# Patient Record
Sex: Female | Born: 1975 | Race: Black or African American | Hispanic: No | Marital: Single | Smoking: Never smoker
Health system: Southern US, Community
[De-identification: ages and names within clinical notes are randomized; demographics above are authoritative.]

## PROBLEM LIST (undated history)

## (undated) DIAGNOSIS — B009 Herpesviral infection, unspecified: Secondary | ICD-10-CM

## (undated) DIAGNOSIS — G43909 Migraine, unspecified, not intractable, without status migrainosus: Secondary | ICD-10-CM

## (undated) DIAGNOSIS — I10 Essential (primary) hypertension: Secondary | ICD-10-CM

## (undated) HISTORY — PX: TUBAL LIGATION: SHX77

---

## 2008-05-03 ENCOUNTER — Encounter: Payer: Self-pay | Admitting: Emergency Medicine

## 2008-05-03 ENCOUNTER — Inpatient Hospital Stay (HOSPITAL_COMMUNITY): Admission: EM | Admit: 2008-05-03 | Discharge: 2008-05-06 | Payer: Self-pay | Admitting: Internal Medicine

## 2008-05-04 ENCOUNTER — Encounter (INDEPENDENT_AMBULATORY_CARE_PROVIDER_SITE_OTHER): Payer: Self-pay | Admitting: Internal Medicine

## 2010-12-26 NOTE — Discharge Summary (Signed)
Robin Jarvis, Robin Jarvis                 ACCOUNT NO.:  [id number]   MEDICAL RECORD NO.:  [id number]          PATIENT TYPE:  INP   LOCATION:  1525                         FACILITY:  [facility]   PHYSICIAN:  Michelene Gardener, MD    DATE OF BIRTH:  1976-04-06   DATE OF ADMISSION:  05/03/2008  DATE OF DISCHARGE:  05/06/2008                               DISCHARGE SUMMARY   DISCHARGE DIAGNOSES:  1. Accelerated hypertension.  2. Noncardiac chest pain.  3. Hyperlipidemia.  4. Obesity.   DISCHARGE MEDICATIONS:  1. Multivitamin 1 tablet p.o. once daily.  2. Hydrochlorothiazide/lisinopril 25/40 mg 1 tablet p.o. once daily.   CONSULTATIONS:  None.   PROCEDURES:  None.   RADIOLOGY STUDIES:  1. Chest x-ray on May 03, 2008 showed possible cardiomegaly with      no acute abnormalities.  2. Echocardiogram on September 22 showed ejection fraction of 65-75%      with no wall motion abnormalities.   COURSE OF HOSPITALIZATION:  1. Accelerated hypertension.  This patient was diagnosed with      hypertension and used to take atenolol.  She stopped atenolol for      the last 1 year.  She presented to the hospital with intermittent      chest pain, and she was found to be in hypertensive emergency with      a blood pressure of 200/112.  The patient was admitted to the      intensive care unit.  She was started on a nitroglycerin drip.  The      patient was started back on atenolol and clonidine was added, in      addition to hydralazine p.r.n.  Three sets of troponin and cardiac      enzymes were done, and they came to be normal.  The patient      developed bradycardia during the hospitalization, so her atenolol      was stopped.  I also stopped her clonidine, and I switched her to a      combination of hydrochlorothiazide and lisinopril where she was      started with 25/20 mg.  Her blood pressure improved well.      Currently, she is in the range of 150-160 systolic blood pressure.      At the time  of discharge, I increased her medications, where she      will take hydrochlorothiazide/lisinopril 25/40 mg once a day.  I      had very long discussion with her that she needs to establish      medical care, and she has to see a doctor within 1 week.  The      patient voiced understanding and stated that she will see her      doctor within 1 week.  2. Noncardiac chest pain.  This is most likely pressure from      hypertension.  The patient did not have any chest pain since her      admission.  Three sets of troponins and cardiac enzymes were done      during  the hospitalization, and they came to be normal.  EKGs      showed no evidence of acute ischemia.  Echocardiogram was done and      showed a normal ejection fraction with no evidence of wall motion      abnormalities.  3. Hyperlipidemia.  The patient was advised about lifestyle      modification.  4. Obesity.  Again, was advised about lifestyle modifications.   DISPOSITION:  Otherwise, the patient seems to be stable, and she was  discharged in satisfactory condition.   TOTAL ASSESSMENT TIME:  Forty minutes.      Michelene Gardener, MD  Electronically Signed     NAE/MEDQ  D:  05/06/2008  T:  05/06/2008  Job:  [id number]

## 2010-12-26 NOTE — H&P (Signed)
NAME:  Robin Jarvis, Robin Jarvis NO.:  [id number]   MEDICAL RECORD NO.:  [id number]          PATIENT TYPE:  INP   LOCATION:  1236                         FACILITY:  [facility]   PHYSICIAN:  Richarda Overlie, MD       DATE OF BIRTH:  Feb 07, 1976   DATE OF ADMISSION:  05/03/2008  DATE OF DISCHARGE:                              HISTORY & PHYSICAL   SUBJECTIVE:  This is a 35 year old female with a history of hypertension  who presented to the ER at [facility] with a chief complaint of  chest pain.  The patient has had intermittent chest pain off and on  since last Thursday.  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  The patient's  chest pain improved with rest.  Her symptoms were shortness of breath,  nausea  and some diaphoresis.  Patient has been diagnosed with  hypertension and was supposed to be taking atenolol but has not been  doing so for the past two years.  The patient's chest pain was described  as intermittent with increasing intensity in duration, most recently  lasting for more than four hours.  Patient's chest pain is notably  present every time she is exerting herself and relieved with rest.  The  radiation of the pain to the left arm is aggravated by deep breathing  and exercise.   In the ER in [facility], the patient was found to have a systolic blood  pressure greater than 200, and she received treatment with metoprolol  and was started on a nitro drip.  Subsequent blood pressures remained  close to systolic at 180-200.  Patient was transferred to [facility]  for further evaluation.   PAST MEDICAL HISTORY:  Hypertension.    MEDICATIONS: none   PAST SURGICAL HISTORY: Bilateral tubal ligation.   SOCIAL HISTORY:  Denies any drug use, nondrinker, nonalcoholic.   FAMILY HISTORY:  Negative for coronary artery disease.   REVIEW OF SYSTEMS:  Ten point review of systems was performed and is  included in the HPI.   PHYSICAL EXAMINATION:  Blood pressure 200/112, pulse 77, respirations  18, temperature 98.9.  GENERAL:  Patient appears to be comfortable currently.  Chest painfree.  No acute distress.  HEENT:  Pupils are equal and reactive.  Extraocular movements are  intact.  Head is atraumatic and normocephalic.  Mouth and pharynx are  normal.  NECK:  Supple with full range of motion.  CARDIOVASCULAR:  Regular rate and rhythm.  No appreciable murmurs, rubs  or gallops.  RESPIRATORY:  Breath sounds are clear to auscultation bilaterally.  No  wheezes, crackles, or rhonchi.  No accessory muscle use.  ABDOMEN:  Soft, nontender, nondistended.  No hepatosplenomegaly.  Normal  bowel sounds.  No guarding or rebound.  EXTREMITIES:  Without any deformity, abnormality.  Full range of motion.  Neurovascularly intact.  Pulses normal.  No tenderness.  No edema.  NEUROLOGIC:  Alert and oriented to time,  place, and person.  Cranial  nerves II-XII are grossly intact.  Motor intact in all extremities.  Sensation normal.  Gait normal.  SKIN:  Color normal.  No rash.  PSYCHIATRIC:  Appropriate mood and affect.  LYMPH NODES:  No palpable lymphadenopathy.   EKG:  Normal sinus rhythm without any ST segment changes.   LABS:  WBC count 12.2, hemoglobin 13.1, hematocrit 38, platelet count  269.  Sodium 142, potassium 3.4, chloride 104, bicarb 26, glucose 91,  creatinine 0.9.  Troponin less than 0.05.  CK-MB 1.4.  Urine drug screen  positive for opiates.   Chest x-ray shows probable cardiomegaly.  No focal abnormalities.   ASSESSMENT/PLAN:  1. Hypertensive urgency associated with chest pain:  Without any EKG      changes  .  The patient will be ruled out for an acute coronary      syndrome.  Will cycle troponins and serial EKGs, monitor the      patient on telemetry.  The patient will be started on aspirin,      continued  with a nitro drip.  Put the patient on atenolol 50 mg      p.o. daily, clonidine 0.1 mg p.o. t.i.d.  Also start the patient on      hydralazine 10 mg IV q.6h. p.r.n. for systolic blood pressure      greater than 180.  Patient's symptoms also suggestive of unstable      angina; therefore, will start the patient on therapeutic dose of      Lovenox adjusted to her renal function.  Chest pain could also be      secondary to musculoskeletal related to the nature of her      occupation.  Will start the patient on Flexeril 10 mg p.o. t.i.d.      p.r.n. pain.   DISPOSITION:  Patient is a full code.  Will be kept n.p.o. past  midnight.      Richarda Overlie, MD  Electronically Signed     NA/MEDQ  D:  05/03/2008  T:  05/04/2008  Job:  [id number]

## 2011-05-14 LAB — DIFFERENTIAL
Basophils Absolute: 0
Basophils Absolute: 0
Basophils Relative: 0
Eosinophils Absolute: 0.1
Eosinophils Absolute: 0.1
Eosinophils Relative: 1
Eosinophils Relative: 1
Lymphocytes Relative: 26
Lymphocytes Relative: 28
Lymphs Abs: 2.9
Lymphs Abs: 3.2
Lymphs Abs: 3.4
Monocytes Absolute: 0.8
Monocytes Absolute: 1.1 — ABNORMAL HIGH
Monocytes Absolute: 1.2 — ABNORMAL HIGH
Monocytes Relative: 7
Monocytes Relative: 9
Neutro Abs: 7.6
Neutro Abs: 7.7
Neutrophils Relative %: 62

## 2011-05-14 LAB — BASIC METABOLIC PANEL WITH GFR
Calcium: 9.4
GFR calc Af Amer: >60
GFR calc non Af Amer: >60
Potassium: 3.4 — ABNORMAL LOW
Sodium: 142

## 2011-05-14 LAB — COMPREHENSIVE METABOLIC PANEL
Albumin: 3 — ABNORMAL LOW
Alkaline Phosphatase: 57
BUN: 5 — ABNORMAL LOW
Chloride: 107
Creatinine, Ser: 0.74
GFR calc non Af Amer: >60
Glucose, Bld: 93
Potassium: 4
Total Bilirubin: 0.9

## 2011-05-14 LAB — CBC
HCT: 35.4 — ABNORMAL LOW
HCT: 35.7 — ABNORMAL LOW
HCT: 38
Hemoglobin: 11.8 — ABNORMAL LOW
Hemoglobin: 12
Hemoglobin: 12.4
Hemoglobin: 13.1
MCHC: 33.5
MCHC: 34.3
MCV: 90.1
MCV: 92.5
MCV: 93.5
Platelets: 223
Platelets: 261
Platelets: 269
RBC: 4.01
RBC: 4.22
RDW: 12
RDW: 12.5
WBC: 10.1
WBC: 11.5 — ABNORMAL HIGH
WBC: 12.2 — ABNORMAL HIGH

## 2011-05-14 LAB — LIPID PANEL
Cholesterol: 196
HDL: 36 — ABNORMAL LOW
LDL Cholesterol: 137 — ABNORMAL HIGH
Total CHOL/HDL Ratio: 5.4
Triglycerides: 114
VLDL: 23

## 2011-05-14 LAB — BASIC METABOLIC PANEL
BUN: 11
BUN: 14
BUN: 8
CO2: 26
Chloride: 104
Chloride: 106
Creatinine, Ser: 0.9
GFR calc non Af Amer: >60
Glucose, Bld: 120 — ABNORMAL HIGH
Glucose, Bld: 91
Glucose, Bld: 99
Potassium: 3.3 — ABNORMAL LOW
Potassium: 3.6
Sodium: 136

## 2011-05-14 LAB — POCT TOXICOLOGY PANEL

## 2011-05-14 LAB — D-DIMER, QUANTITATIVE: D-Dimer, Quant: <0.22

## 2011-05-14 LAB — CARDIAC PANEL(CRET KIN+CKTOT+MB+TROPI)
CK, MB: 1.6
Troponin I: 0.02

## 2011-05-14 LAB — POCT CARDIAC MARKERS
CKMB, poc: 1.6
Myoglobin, poc: 38.4
Troponin i, poc: <0.05

## 2011-11-20 ENCOUNTER — Emergency Department (HOSPITAL_BASED_OUTPATIENT_CLINIC_OR_DEPARTMENT_OTHER)
Admission: EM | Admit: 2011-11-20 | Discharge: 2011-11-20 | Disposition: A | Discharge status: Home / Self Care | Payer: Medicaid Other | Attending: Emergency Medicine | Admitting: Emergency Medicine

## 2011-11-20 ENCOUNTER — Encounter (HOSPITAL_BASED_OUTPATIENT_CLINIC_OR_DEPARTMENT_OTHER): Payer: Self-pay | Admitting: Emergency Medicine

## 2011-11-20 DIAGNOSIS — IMO0002 Reserved for concepts with insufficient information to code with codable children: Secondary | ICD-10-CM | POA: Insufficient documentation

## 2011-11-20 DIAGNOSIS — R202 Paresthesia of skin: Secondary | ICD-10-CM

## 2011-11-20 DIAGNOSIS — M5417 Radiculopathy, lumbosacral region: Secondary | ICD-10-CM

## 2011-11-20 DIAGNOSIS — M549 Dorsalgia, unspecified: Secondary | ICD-10-CM

## 2011-11-20 DIAGNOSIS — I10 Essential (primary) hypertension: Secondary | ICD-10-CM | POA: Insufficient documentation

## 2011-11-20 HISTORY — DX: Essential (primary) hypertension: I10

## 2011-11-20 LAB — URINALYSIS, ROUTINE W REFLEX MICROSCOPIC
Glucose, UA: NEGATIVE mg/dL
Ketones, ur: NEGATIVE mg/dL
Leukocytes, UA: NEGATIVE
Nitrite: NEGATIVE
Protein, ur: NEGATIVE mg/dL
Urobilinogen, UA: 1 mg/dL (ref 0.0–1.0)

## 2011-11-20 LAB — PREGNANCY, URINE: Preg Test, Ur: NEGATIVE

## 2011-11-20 MED ORDER — OXYCODONE-ACETAMINOPHEN 5-325 MG PO TABS: 1.0000 | ORAL_TABLET | Freq: Four times a day (QID) | ORAL | Status: AC | PRN

## 2011-11-20 MED ORDER — IBUPROFEN 600 MG PO TABS: 600.0000 mg | ORAL_TABLET | Freq: Four times a day (QID) | ORAL | Status: AC | PRN

## 2011-11-20 MED ORDER — PREDNISONE 20 MG PO TABS: 20.0000 mg | ORAL_TABLET | Freq: Every day | ORAL | Status: DC

## 2011-11-20 NOTE — ED Notes (Signed)
Pt states she has been having left lower abdominal pain/groin pain x one week.  Some nausea, no fevers.  Pt states period is off.

## 2011-11-20 NOTE — Discharge Instructions (Signed)
Lumbosacral Radiculopathy  Lumbosacral radiculopathy is a pinched nerve or nerves in the low back (lumbosacral area). When this happens you may have weakness in your legs and may not be able to stand on your toes. You may have pain going down into your legs. There may be difficulties with walking normally. There are many causes of this problem. Sometimes this may happen from an injury, or simply from arthritis or boney problems. It may also be caused by other illnesses such as diabetes. If there is no improvement after treatment, further studies may be done to find the exact cause. DIAGNOSIS  X-rays may be needed if the problems become long standing. Electromyograms may be done. This study is one in which the working of nerves and muscles is studied. HOME CARE INSTRUCTIONS   Applications of ice packs may be helpful. Ice can be used in a plastic bag with a towel around it to prevent frostbite to skin. This may be used every 2 hours for 20 to 30 minutes, or as needed, while awake, or as directed by your caregiver.   Only take over-the-counter or prescription medicines for pain, discomfort as directed by your caregiver.   If physical therapy was prescribed, follow your caregiver's directions.  SEEK IMMEDIATE MEDICAL CARE IF:   You have pain not controlled with medications.   You seem to be getting worse rather than better.   You develop increasing weakness in your legs.   You develop loss of bowel or bladder control.   You have difficulty with walking or balance, or develop clumsiness in the use of your legs.   You have a fever.  MAKE SURE YOU:   Understand these instructions.   Will watch your condition.   Will get help right away if you are not doing well or get worse.  Document Released: 07/30/2005 Document Revised: 07/19/2011 Document Reviewed: 03/19/2008 [facility] Patient Information 2012 [place], [place].  RESOURCE GUIDE  Dental Problems  Patients with Medicaid: [facility] [phone] [address]                                           [phone] [address] Phone:  [phone]                                                   Phone:  [phone]  If unable to pay or uninsured, contact:  [facility] or [facility]. to become qualified for the adult dental clinic.  Chronic Pain Problems Contact [facility]  [phone] Patients need to be referred by their primary care doctor.  Insufficient Money for Medicine Contact [employer] Way:  call "211" or [facility] [phone].  No Primary Care Doctor Call [facility]  [phone] Other agencies that provide inexpensive medical care    [facility]  [phone]    [facility]  [phone]    [facility]  [phone]    [facility]  [phone]    [facility]  [phone]    [facility]  [phone]  Psychological Services  [facility]  [phone] [facility]  [phone] [facility]   [phone] (emergency services [phone])  Abuse/Neglect [facility] Child Abuse Hotline 606 255 1513 [facility] Child Abuse Hotline [phone] (After Hours)  Emergency Shelter [facility] [phone]  [facility] 5871084321 [facility] [phone]  MRSA Hotline #:   980-863-2776    [facility] Resources  [facility]  United [employer]                           [facility] Dept. [address] [place]                     [address]         [address]  Blondell Reveal Phone:  [phone]                                  Phone:  [phone]                   Phone:  [phone]  [facility] Phone:  [phone]  [facility]  Child Abuse Hotline [phone] [phone] (After Hours)

## 2011-11-20 NOTE — ED Provider Notes (Signed)
History     CSN: [id number]  Arrival date & time 11/20/11  0803   First MD Initiated Contact with Patient 11/20/11 [phone]      Chief Complaint  Patient presents with  . Back Pain  . Numbness  . Nausea    (Consider location/radiation/quality/duration/timing/severity/associated sxs/prior treatment) HPI   Multiple complaints x one week. CO Lt flank/back pain x one week. Constant and pulsating with some radiation to leg. 5/10 at this time and dull/nagging. Has taken midol, ibuprofen with min relief of pain. Denies fever +chills. +Nausea without vomiting. Denies constipation/diarrhea. Denies abdominal pain. No rash. Denies hematuria/dysuria/freq/urgency. Denies vaginal discharge.  C/O leg numbness and tingling with sitting/flexed position only. Denies leg weakness. Remains ambulatory. Denies history of recent trauma/falls. Denies h/o malignancy, DM, immunocompromise  injection drug use, immunosuppression, indwelling urinary catheter, prolonged steroid use, skin or urinary tract infection.  Denies saddle anesthesia, no urinary incontinence or retention.   States her menstrual cycle is 4 days late  H/o BTL 8 years ago   ED Notes, ED Provider Notes from 11/20/11 0000 to 11/20/11 08:19:04       Marva Brett Fairy, RN 11/20/2011 08:16      Pt states she has been having left lower abdominal pain/groin pain x one week. Some nausea, no fevers. Pt states period is off.    Past Medical History  Diagnosis Date  . Hypertension     Past Surgical History  Procedure Date  . Cesarean section   . Tubal ligation     No family history on file.  History  Substance Use Topics  . Smoking status: Not on file  . Smokeless tobacco: Not on file  . Alcohol Use:     OB History    Grav Para Term Preterm Abortions TAB SAB Ect Mult Living                  Review of Systems  All other systems reviewed and are negative.  except as noted HPI   Allergies  Review of patient's allergies indicates no  known allergies.  Home Medications   Current Outpatient Rx  Name Route Sig Dispense Refill  . ATENOLOL 50 MG PO TABS Oral Take 50 mg by mouth 2 (two) times daily.    Marland Kitchen LISINOPRIL 10 MG PO TABS Oral Take 10 mg by mouth daily.    . IBUPROFEN 600 MG PO TABS Oral Take 1 tablet (600 mg total) by mouth every 6 (six) hours as needed for pain. 30 tablet 0  . OXYCODONE-ACETAMINOPHEN 5-325 MG PO TABS Oral Take 1 tablet by mouth every 6 (six) hours as needed for pain. 6 tablet 0  . PREDNISONE 20 MG PO TABS Oral Take 1 tablet (20 mg total) by mouth daily. 7 tablet 0    BP 148/91  Pulse 60  Temp(Src) 99.2 F (37.3 C) (Oral)  Resp 20  SpO2 100%  LMP 10/17/2011  Physical Exam  Nursing note and vitals reviewed. Constitutional: She is oriented to person, place, and time. She appears well-developed.  HENT:  Head: Atraumatic.  Mouth/Throat: Oropharynx is clear and moist.  Eyes: Conjunctivae and EOM are normal. Pupils are equal, round, and reactive to light.  Neck: Normal range of motion. Neck supple.  Cardiovascular: Normal rate, regular rhythm, normal heart sounds and intact distal pulses.   Pulmonary/Chest: Effort normal and breath sounds normal. No respiratory distress. She has no wheezes. She has no rales.  Abdominal: Soft. She exhibits no distension. There is no  tenderness. There is no rebound and no guarding.       No ttp  Musculoskeletal: Normal range of motion.       Back:       Straight leg raise neg b/l, no increased pain but LLE flexion at the hip pt c/o foot numbness/tingling  No midline c/t/l/s ttp   Neurological: She is alert and oriented to person, place, and time.  Skin: Skin is warm and dry. No rash noted.  Psychiatric: She has a normal mood and affect.    ED Course  Procedures (including critical care time)   Labs Reviewed  URINALYSIS, ROUTINE W REFLEX MICROSCOPIC  PREGNANCY, URINE   No results found.  1. Back pain   2. Radiculopathy of lumbosacral region   3.  Paresthesia of left leg     MDM  Pt c/o L/S pain with paresthesia of left leg with sitting. DDx incl nerve compression including stenosis, disc herniation, less likely hematoma/infection/tumor given history. Neurovascularly intact. Urinalysis unremarkable. I do not suspect acute abdominal/pelvic etiology. PMD f/u with prednisone, percocet, ibuprofen. Will need advanced imaging if symptoms persist. Given strict precautions for return.         Forbes Cellar, MD 11/20/11 [phone]

## 2015-01-26 ENCOUNTER — Emergency Department (HOSPITAL_BASED_OUTPATIENT_CLINIC_OR_DEPARTMENT_OTHER)
Admission: EM | Admit: 2015-01-26 | Discharge: 2015-01-26 | Disposition: A | Discharge status: Home / Self Care | Payer: Self-pay | Attending: Emergency Medicine | Admitting: Emergency Medicine

## 2015-01-26 ENCOUNTER — Encounter (HOSPITAL_BASED_OUTPATIENT_CLINIC_OR_DEPARTMENT_OTHER): Payer: Self-pay

## 2015-01-26 DIAGNOSIS — Y929 Unspecified place or not applicable: Secondary | ICD-10-CM | POA: Insufficient documentation

## 2015-01-26 DIAGNOSIS — Y939 Activity, unspecified: Secondary | ICD-10-CM | POA: Insufficient documentation

## 2015-01-26 DIAGNOSIS — Y999 Unspecified external cause status: Secondary | ICD-10-CM | POA: Insufficient documentation

## 2015-01-26 DIAGNOSIS — I10 Essential (primary) hypertension: Secondary | ICD-10-CM | POA: Insufficient documentation

## 2015-01-26 DIAGNOSIS — G43909 Migraine, unspecified, not intractable, without status migrainosus: Secondary | ICD-10-CM | POA: Insufficient documentation

## 2015-01-26 DIAGNOSIS — M62830 Muscle spasm of back: Secondary | ICD-10-CM | POA: Insufficient documentation

## 2015-01-26 DIAGNOSIS — M62838 Other muscle spasm: Secondary | ICD-10-CM

## 2015-01-26 DIAGNOSIS — Z79899 Other long term (current) drug therapy: Secondary | ICD-10-CM | POA: Insufficient documentation

## 2015-01-26 DIAGNOSIS — X58XXXA Exposure to other specified factors, initial encounter: Secondary | ICD-10-CM | POA: Insufficient documentation

## 2015-01-26 DIAGNOSIS — S39012A Strain of muscle, fascia and tendon of lower back, initial encounter: Secondary | ICD-10-CM | POA: Insufficient documentation

## 2015-01-26 HISTORY — DX: Migraine, unspecified, not intractable, without status migrainosus: G43.909

## 2015-01-26 MED ORDER — MELOXICAM 15 MG PO TABS: 15.0000 mg | ORAL_TABLET | Freq: Every day | ORAL | Status: DC

## 2015-01-26 MED ORDER — METHOCARBAMOL 500 MG PO TABS: 500.0000 mg | ORAL_TABLET | Freq: Two times a day (BID) | ORAL | Status: DC

## 2015-01-26 MED ORDER — KETOROLAC TROMETHAMINE 60 MG/2ML IM SOLN
60.0000 mg | Freq: Once | INTRAMUSCULAR | Status: AC
  Administered 2015-01-26: 60 mg via INTRAMUSCULAR
  Filled 2015-01-26: qty 2

## 2015-01-26 MED ORDER — METHOCARBAMOL 500 MG PO TABS
1000.0000 mg | ORAL_TABLET | Freq: Once | ORAL | Status: AC
  Administered 2015-01-26: 1000 mg via ORAL
  Filled 2015-01-26: qty 2

## 2015-01-26 NOTE — Discharge Instructions (Signed)
Heat Therapy °Heat therapy can help make painful, stiff muscles and joints feel better. Do not use heat on new injuries. Wait at least 48 hours after an injury to use heat. Do not use heat when you have aches or pains right after an activity. If you still have pain 3 hours after stopping the activity, then you may use heat. °HOME CARE °Wet heat pack °· Soak a clean towel in warm water. Squeeze out the extra water. °· Put the warm, wet towel in a plastic bag. °· Place a thin, dry towel between your skin and the bag. °· Put the heat pack on the area for 5 minutes, and check your skin. Your skin may be pink, but it should not be red. °· Leave the heat pack on the area for 15 to 30 minutes. °· Repeat this every 2 to 4 hours while awake. Do not use heat while you are sleeping. °Warm water bath °· Fill a tub with warm water. °· Place the affected body part in the tub. °· Soak the area for 20 to 40 minutes. °· Repeat as needed. °Hot water bottle °· Fill the water bottle half full with hot water. °· Press out the extra air. Close the cap tightly. °· Place a dry towel between your skin and the bottle. °· Put the bottle on the area for 5 minutes, and check your skin. Your skin may be pink, but it should not be red. °· Leave the bottle on the area for 15 to 30 minutes. °· Repeat this every 2 to 4 hours while awake. °Electric heating pad °· Place a dry towel between your skin and the heating pad. °· Set the heating pad on low heat. °· Put the heating pad on the area for 10 minutes, and check your skin. Your skin may be pink, but it should not be red. °· Leave the heating pad on the area for 20 to 40 minutes. °· Repeat this every 2 to 4 hours while awake. °· Do not lie on the heating pad. °· Do not fall asleep while using the heating pad. °· Do not use the heating pad near water. °GET HELP RIGHT AWAY IF: °· You get blisters or red skin. °· Your skin is puffy (swollen), or you lose feeling (numbness) in the affected area. °· You  have any new problems. °· Your problems are getting worse. °· You have any questions or concerns. °If you have any problems, stop using heat therapy until you see your doctor. °MAKE SURE YOU: °· Understand these instructions. °· Will watch your condition. °· Will get help right away if you are not doing well or get worse. °Document Released: 10/22/2011 Document Reviewed: 09/22/2013 °Exit[employer]® Patient Information ©2015 [employer]. This information is not intended to replace advice given to you by your health care provider. Make sure you discuss any questions you have with your health care provider. ° °

## 2015-01-26 NOTE — ED Provider Notes (Signed)
CSN: [id number]     Arrival date & time 01/26/15  2222 History  This chart was scribed for Benny Henrie, MD by Ronney Lion, ED Scribe. This patient was seen in room MH10/MH10 and the patient's care was started at 11:16 PM.    Chief Complaint  Patient presents with  . Back Pain   Patient is a 39 y.o. female presenting with back pain. The history is provided by the patient. No language interpreter was used.  Back Pain Location:  Sacro-iliac joint Quality:  Aching Radiates to:  Does not radiate Pain severity:  Moderate Pain is:  Same all the time Onset quality:  Gradual Duration:  1 week Timing:  Constant Progression:  Worsening Chronicity:  New Context comment:  Repetitive occupational twisting motions  Relieved by:  Nothing Worsened by:  Nothing tried Ineffective treatments:  Ibuprofen Associated symptoms: no abdominal pain, no abdominal swelling, no bladder incontinence, no bowel incontinence, no chest pain, no dysuria, no fever, no headaches, no leg pain, no numbness, no paresthesias, no pelvic pain, no perianal numbness, no tingling, no weakness and no weight loss   Risk factors: no hx of cancer     HPI Comments: Robin Jarvis is a 39 y.o. female who presents to the Emergency Department complaining of constant, moderate mid and low back pain that began 1 week ago. She reports repetitive twisting motions at her first job and having to constantly move at her second job. Patient denies wearing any back bands at either her jobs.She has tried ibuprofen with no relief. Patient reports adequate water intake.   Past Medical History  Diagnosis Date  . Hypertension   . Migraine    Past Surgical History  Procedure Laterality Date  . Cesarean section    . Tubal ligation     No family history on file. History  Substance Use Topics  . Smoking status: Never Smoker   . Smokeless tobacco: Not on file  . Alcohol Use: Yes   OB History    No data available     Review of Systems   Constitutional: Negative for fever and weight loss.  Cardiovascular: Negative for chest pain.  Gastrointestinal: Negative for abdominal pain and bowel incontinence.  Genitourinary: Negative for bladder incontinence, dysuria and pelvic pain.  Musculoskeletal: Positive for back pain.  Neurological: Negative for tingling, weakness, numbness, headaches and paresthesias.  All other systems reviewed and are negative.   Allergies  Lisinopril  Home Medications   Prior to Admission medications   Medication Sig Start Date End Date Taking? Authorizing Provider  hydrochlorothiazide (HYDRODIURIL) 25 MG tablet Take 25 mg by mouth daily.   Yes Historical Provider, MD  topiramate (TOPAMAX) 50 MG tablet Take 50 mg by mouth 2 (two) times daily.   Yes Historical Provider, MD   BP 203/123 mmHg  Pulse 85  Temp(Src) 98.8 F (37.1 C) (Oral)  Resp 18  Ht  (1.753 m)  Wt 239 lb (108.41 kg)  BMI 35.28 kg/m2  SpO2 100%  LMP 01/22/2015 Physical Exam  Constitutional: She is oriented to person, place, and time. She appears well-developed and well-nourished. No distress.  HENT:  Head: Normocephalic and atraumatic.  Mouth/Throat: Oropharynx is clear and moist. No oropharyngeal exudate.  Moist mm.  Eyes: EOM are normal. Pupils are equal, round, and reactive to light.  Neck: Normal range of motion. Neck supple. No tracheal deviation present.  Cardiovascular: Normal rate, regular rhythm and normal heart sounds.   Pulmonary/Chest: Effort normal and breath sounds normal.  No respiratory distress. She has no wheezes. She has no rales.  Abdominal: Soft. Bowel sounds are normal. There is no tenderness. There is no rebound and no guarding.  Musculoskeletal: Normal range of motion.       Right shoulder: She exhibits no crepitus, no deformity, no spasm and normal pulse.  No step offs or crepitance or point tenderness of the  c t or L spine  Neurological: She is alert and oriented to person, place, and time. She  has normal reflexes. No cranial nerve deficit.  DTR's are intact.  Skin: Skin is warm and dry.  Psychiatric: She has a normal mood and affect.  Nursing note and vitals reviewed.   ED Course  Procedures (including critical care time)  DIAGNOSTIC STUDIES: Oxygen Saturation is 100% on RA, normal by my interpretation.    COORDINATION OF CARE: 11:19 PM - Discussed treatment plan with pt at bedside which includes muscle relaxants, and pt agreed to plan.    MDM   Final diagnoses:  None   Will treat for muscle strain with Mobic and robaxin.  Return for weakness numbness difficulty urinating or any concerns.    I personally performed the services described in this documentation, which was scribed in my presence. The recorded information has been reviewed and is accurate.     Cy Blamer, MD 01/27/15 [phone]

## 2015-01-26 NOTE — ED Notes (Signed)
C/o mid/lower back pain x 1 week-denies injury-steady gait in to triage

## 2015-01-27 ENCOUNTER — Encounter (HOSPITAL_BASED_OUTPATIENT_CLINIC_OR_DEPARTMENT_OTHER): Payer: Self-pay | Admitting: Emergency Medicine

## 2015-06-11 ENCOUNTER — Encounter (HOSPITAL_BASED_OUTPATIENT_CLINIC_OR_DEPARTMENT_OTHER): Payer: Self-pay | Admitting: *Deleted

## 2015-06-11 ENCOUNTER — Emergency Department (HOSPITAL_BASED_OUTPATIENT_CLINIC_OR_DEPARTMENT_OTHER)
Admission: EM | Admit: 2015-06-11 | Discharge: 2015-06-11 | Disposition: A | Discharge status: Home / Self Care | Payer: BLUE CROSS/BLUE SHIELD | Attending: Emergency Medicine | Admitting: Emergency Medicine

## 2015-06-11 DIAGNOSIS — M545 Low back pain, unspecified: Secondary | ICD-10-CM

## 2015-06-11 DIAGNOSIS — I10 Essential (primary) hypertension: Secondary | ICD-10-CM | POA: Diagnosis not present

## 2015-06-11 DIAGNOSIS — G8929 Other chronic pain: Secondary | ICD-10-CM | POA: Insufficient documentation

## 2015-06-11 DIAGNOSIS — Z79899 Other long term (current) drug therapy: Secondary | ICD-10-CM | POA: Diagnosis not present

## 2015-06-11 DIAGNOSIS — G43909 Migraine, unspecified, not intractable, without status migrainosus: Secondary | ICD-10-CM | POA: Diagnosis not present

## 2015-06-11 MED ORDER — METHOCARBAMOL 500 MG PO TABS
500.0000 mg | ORAL_TABLET | Freq: Two times a day (BID) | ORAL | Status: DC
Start: 2015-06-11 — End: 2019-11-10

## 2015-06-11 MED ORDER — MELOXICAM 15 MG PO TABS: 15.0000 mg | ORAL_TABLET | Freq: Every day | ORAL | Status: DC

## 2015-06-11 NOTE — ED Notes (Signed)
Pt reports frequent episodes of low back pain. Current episode began around Wednesday. Pt reports taking Motrin (800 mg), which is ineffective. Denies fever, n/v/d, radiation to legs, incontinence.

## 2015-06-11 NOTE — ED Provider Notes (Signed)
CSN: [id number]     Arrival date & time 06/11/15  2218 History   First MD Initiated Contact with Patient 06/11/15 2255     Chief Complaint  Patient presents with  . Back Pain     (Consider location/radiation/quality/duration/timing/severity/associated sxs/prior Treatment) HPI   Robin Jarvis is a 39 y.o. female presenting with lower back pain that started a few months ago, but got worse yesterday. Pt rates the pain at 8/10, described as a "pulling" of the muscle, non-radiating. Pt has tried ibuprofen with minimal relief. Pt states the mobic she got last time worked well. Pt denies fever/chills, N/V, abdominal pain, urinary symptoms, vaginal pain, discharge, or bleeding, or any other pain or complaints. Denies incontinence or saddle anesthesias.     Past Medical History  Diagnosis Date  . Hypertension   . Migraine    Past Surgical History  Procedure Laterality Date  . Cesarean section    . Tubal ligation     No family history on file. Social History  Substance Use Topics  . Smoking status: Never Smoker   . Smokeless tobacco: Never Used  . Alcohol Use: 2.4 oz/week    4 Glasses of wine per week   OB History    No data available     Review of Systems  Musculoskeletal: Positive for back pain.  All other systems reviewed and are negative.     Allergies  Lisinopril  Home Medications   Prior to Admission medications   Medication Sig Start Date End Date Taking? Authorizing Provider  hydrochlorothiazide (HYDRODIURIL) 25 MG tablet Take 25 mg by mouth daily.   Yes Historical Provider, MD  topiramate (TOPAMAX) 50 MG tablet Take 50 mg by mouth 2 (two) times daily.   Yes Historical Provider, MD  meloxicam (MOBIC) 15 MG tablet Take 1 tablet (15 mg total) by mouth daily. 06/11/15   Shawn C Joy, PA-C  methocarbamol (ROBAXIN) 500 MG tablet Take 1 tablet (500 mg total) by mouth 2 (two) times daily. 06/11/15   Shawn C Joy, PA-C   BP 214/113 mmHg  Pulse 69  Temp(Src) 98.5 F (36.9  C) (Oral)  Resp 18  Ht 5\' 9"  (1.753 m)  Wt 240 lb (108.863 kg)  BMI 35.43 kg/m2  SpO2 100%  LMP 05/30/2015 (Exact Date) Physical Exam  Constitutional: She appears well-developed.  Eyes: Conjunctivae are normal. Pupils are equal, round, and reactive to light.  Neck: Normal range of motion.  Cardiovascular: Normal rate and regular rhythm.   Pulmonary/Chest: Effort normal.  Musculoskeletal: Normal range of motion.  Full ROM, no tenderness, and no step off in L-spine or T-spine.  Neurological: She is alert.  No sensory deficits. Strength 5/5 all extremities. No gait disturbance.     ED Course  Procedures (including critical care time) Labs Review Labs Reviewed - No data to display  Imaging Review No results found. I have personally reviewed and evaluated these images and lab results as part of my medical decision-making.   EKG Interpretation None      MDM   Final diagnoses:  Right-sided low back pain without sciatica    Honestee Coppler presents with acute on chronic lower back pain.  Suspect musculoskeletal source of pain. Muscle relaxers and anti-inflammatory medication indicated. No indications for imaging at this time. Plan of care communicated to pt, who agreed to plan.     Anselm Pancoast, PA-C 06/11/15 2320  Margarita Grizzle, MD 06/12/15 [phone]

## 2015-06-11 NOTE — ED Notes (Signed)
Pt reports she's currently out of her BP med (for 3 weeks) and needs a refill.

## 2015-06-11 NOTE — Discharge Instructions (Signed)
You have been seen today for lower back pain. It is recommended that you select and follow up with a PCP on this matter for chronic care. Return to ED should symptoms worsen.   Back Exercises The following exercises strengthen the muscles that help to support the back. They also help to keep the lower back flexible. Doing these exercises can help to prevent back pain or lessen existing pain. If you have back pain or discomfort, try doing these exercises 2-3 times each day or as told by your health care provider. When the pain goes away, do them once each day, but increase the number of times that you repeat the steps for each exercise (do more repetitions). If you do not have back pain or discomfort, do these exercises once each day or as told by your health care provider. EXERCISES Single Knee to Chest Repeat these steps 3-5 times for each leg:  Lie on your back on a firm bed or the floor with your legs extended.  Bring one knee to your chest. Your other leg should stay extended and in contact with the floor.  Hold your knee in place by grabbing your knee or thigh.  Pull on your knee until you feel a gentle stretch in your lower back.  Hold the stretch for 10-30 seconds.  Slowly release and straighten your leg. Pelvic Tilt Repeat these steps 5-10 times:  Lie on your back on a firm bed or the floor with your legs extended.  Bend your knees so they are pointing toward the ceiling and your feet are flat on the floor.  Tighten your lower abdominal muscles to press your lower back against the floor. This motion will tilt your pelvis so your tailbone points up toward the ceiling instead of pointing to your feet or the floor.  With gentle tension and even breathing, hold this position for 5-10 seconds. Cat-Cow Repeat these steps until your lower back becomes more flexible:  Get into a hands-and-knees position on a firm surface. Keep your hands under your shoulders, and keep your knees  under your hips. You may place padding under your knees for comfort.  Let your head hang down, and point your tailbone toward the floor so your lower back becomes rounded like the back of a cat.  Hold this position for 5 seconds.  Slowly lift your head and point your tailbone up toward the ceiling so your back forms a sagging arch like the back of a cow.  Hold this position for 5 seconds. Press-Ups Repeat these steps 5-10 times:  Lie on your abdomen (face-down) on the floor.  Place your palms near your head, about shoulder-width apart.  While you keep your back as relaxed as possible and keep your hips on the floor, slowly straighten your arms to raise the top half of your body and lift your shoulders. Do not use your back muscles to raise your upper torso. You may adjust the placement of your hands to make yourself more comfortable.  Hold this position for 5 seconds while you keep your back relaxed.  Slowly return to lying flat on the floor. Bridges Repeat these steps 10 times:  Lie on your back on a firm surface.  Bend your knees so they are pointing toward the ceiling and your feet are flat on the floor.  Tighten your buttocks muscles and lift your buttocks off of the floor until your waist is at almost the same height as your knees. You should feel  the muscles working in your buttocks and the back of your thighs. If you do not feel these muscles, slide your feet 1-2 inches farther away from your buttocks.  Hold this position for 3-5 seconds.  Slowly lower your hips to the starting position, and allow your buttocks muscles to relax completely. If this exercise is too easy, try doing it with your arms crossed over your chest. Abdominal Crunches Repeat these steps 5-10 times:  Lie on your back on a firm bed or the floor with your legs extended.  Bend your knees so they are pointing toward the ceiling and your feet are flat on the floor.  Cross your arms over your  chest.  Tip your chin slightly toward your chest without bending your neck.  Tighten your abdominal muscles and slowly raise your trunk (torso) high enough to lift your shoulder blades a tiny bit off of the floor. Avoid raising your torso higher than that, because it can put too much stress on your low back and it does not help to strengthen your abdominal muscles.  Slowly return to your starting position. Back Lifts Repeat these steps 5-10 times: 1. Lie on your abdomen (face-down) with your arms at your sides, and rest your forehead on the floor. 2. Tighten the muscles in your legs and your buttocks. 3. Slowly lift your chest off of the floor while you keep your hips pressed to the floor. Keep the back of your head in line with the curve in your back. Your eyes should be looking at the floor. 4. Hold this position for 3-5 seconds. 5. Slowly return to your starting position. SEEK MEDICAL CARE IF:  Your back pain or discomfort gets much worse when you do an exercise.  Your back pain or discomfort does not lessen within 2 hours after you exercise. If you have any of these problems, stop doing these exercises right away. Do not do them again unless your health care provider says that you can. SEEK IMMEDIATE MEDICAL CARE IF:  You develop sudden, severe back pain. If this happens, stop doing the exercises right away. Do not do them again unless your health care provider says that you can.   This information is not intended to replace advice given to you by your health care provider. Make sure you discuss any questions you have with your health care provider.   Document Released: 09/06/2004 Document Revised: 04/20/2015 Document Reviewed: 09/23/2014 Elsevier Interactive Patient Education 2016 [place] Injury Prevention Back injuries can be very painful. They can also be difficult to heal. After having one back injury, you are more likely to injure your back again. It is important  to learn how to avoid injuring or re-injuring your back. The following tips can help you to prevent a back injury. WHAT SHOULD I KNOW ABOUT PHYSICAL FITNESS?  Exercise for 30 minutes per day on most days of the week or as directed by your health care provider. Make sure to:  Do aerobic exercises, such as walking, jogging, biking, or swimming.  Do exercises that increase balance and strength, such as tai chi and yoga. These can decrease your risk of falling and injuring your back.  Do stretching exercises to help with flexibility.  Try to develop strong abdominal muscles. Your abdominal muscles provide a lot of the support that is needed by your back.  Maintain a healthy weight. This helps to decrease your risk of a back injury. WHAT SHOULD I KNOW ABOUT MY DIET?  Talk with your health care provider about your overall diet. Take supplements and vitamins only as directed by your health care provider.  Talk with your health care provider about how much calcium and vitamin D you need each day. These nutrients help to prevent weakening of the bones (osteoporosis). Osteoporosis can cause broken (fractured) bones, which lead to back pain.  Include good sources of calcium in your diet, such as dairy products, green leafy vegetables, and products that have had calcium added to them (fortified).  Include good sources of vitamin D in your diet, such as milk and foods that are fortified with vitamin D. WHAT SHOULD I KNOW ABOUT MY POSTURE?  Sit up straight and stand up straight. Avoid leaning forward when you sit or hunching over when you stand.  Choose chairs that have good low-back (lumbar) support.  If you work at a desk, sit close to it so you do not need to lean over. Keep your chin tucked in. Keep your neck drawn back, and keep your elbows bent at a right angle. Your arms should look like the letter "L."  Sit high and close to the steering wheel when you drive. Add a lumbar support to your  car seat, if needed.  Avoid sitting or standing in one position for very long. Take breaks to get up, stretch, and walk around at least one time every hour. Take breaks every hour if you are driving for long periods of time.  Sleep on your side with your knees slightly bent, or sleep on your back with a pillow under your knees. Do not lie on the front of your body to sleep. WHAT SHOULD I KNOW ABOUT LIFTING, TWISTING, AND REACHING? Lifting and Heavy Lifting  Avoid heavy lifting, especially repetitive heavy lifting. If you must do heavy lifting:  Stretch before lifting.  Work slowly.  Rest between lifts.  Use a tool such as a cart or a dolly to move objects if one is available.  Make several small trips instead of carrying one heavy load.  Ask for help when you need it, especially when moving big objects.  Follow these steps when lifting:  Stand with your feet shoulder-width apart.  Get as close to the object as you can. Do not try to pick up a heavy object that is far from your body.  Use handles or lifting straps if they are available.  Bend at your knees. Squat down, but keep your heels off the floor.  Keep your shoulders pulled back, your chin tucked in, and your back straight.  Lift the object slowly while you tighten the muscles in your legs, abdomen, and buttocks. Keep the object as close to the center of your body as possible.  Follow these steps when putting down a heavy load:  Stand with your feet shoulder-width apart.  Lower the object slowly while you tighten the muscles in your legs, abdomen, and buttocks. Keep the object as close to the center of your body as possible.  Keep your shoulders pulled back, your chin tucked in, and your back straight.  Bend at your knees. Squat down, but keep your heels off the floor.  Use handles or lifting straps if they are available. Twisting and Reaching  Avoid lifting heavy objects above your waist.  Do not twist at  your waist while you are lifting or carrying a load. If you need to turn, move your feet.  Do not bend over without bending at your knees.  Avoid  reaching over your head, across a table, or for an object on a high surface. WHAT ARE SOME OTHER TIPS?  Avoid wet floors and icy ground. Keep sidewalks clear of ice to prevent falls.  Do not sleep on a mattress that is too soft or too hard.  Keep items that are used frequently within easy reach.  Put heavier objects on shelves at waist level, and put lighter objects on lower or higher shelves.  Find ways to decrease your stress, such as exercise, massage, or relaxation techniques. Stress can build up in your muscles. Tense muscles are more vulnerable to injury.  Talk with your health care provider if you feel anxious or depressed. These conditions can make back pain worse.  Wear flat heel shoes with cushioned soles.  Avoid sudden movements.  Use both shoulder straps when carrying a backpack.  Do not use any tobacco products, including cigarettes, chewing tobacco, or electronic cigarettes. If you need help quitting, ask your health care provider.   This information is not intended to replace advice given to you by your health care provider. Make sure you discuss any questions you have with your health care provider.   Document Released: 09/06/2004 Document Revised: 12/14/2014 Document Reviewed: 08/03/2014 Elsevier Interactive Patient Education 2016 [employer].   Emergency Department Resource Guide 1) Find a Doctor and Pay Out of Pocket Although you won't have to find out who is covered by your insurance plan, it is a good idea to ask around and get recommendations. You will then need to call the office and see if the doctor you have chosen will accept you as a new patient and what types of options they offer for patients who are self-pay. Some doctors offer discounts or will set up payment plans for their patients who do not have  insurance, but you will need to ask so you aren't surprised when you get to your appointment.  2) Contact Your Local Health Department Not all health departments have doctors that can see patients for sick visits, but many do, so it is worth a call to see if yours does. If you don't know where your local health department is, you can check in your phone book. The CDC also has a tool to help you locate your state's health department, and many state websites also have listings of all of their local health departments.  3) Find a [facility] If your illness is not likely to be very severe or complicated, you may want to try a walk in clinic. These are popping up all over the country in pharmacies, drugstores, and shopping centers. They're usually staffed by nurse practitioners or physician assistants that have been trained to treat common illnesses and complaints. They're usually fairly quick and inexpensive. However, if you have serious medical issues or chronic medical problems, these are probably not your best option.  No Primary Care Doctor: - Call Health Connect at  [phone] - they can help you locate a primary care doctor that  accepts your insurance, provides certain services, etc. - Physician Referral Service- [phone]  Chronic Pain Problems: Organization         Address  Phone   Notes  [facility]  [phone] Patients need to be referred by their primary care doctor.   Medication Assistance: Organization         Address  Phone   Notes  [facility] Medication [facility] [address] [place], [place] [address] [phone] --Must be a resident of [facility] -- Must have NO insurance coverage whatsoever (no Medicaid/ Medicare, etc.) -- The pt. MUST have a primary care doctor that directs their care regularly and follows them in the community   MedAssist  [phone]   [employer]  [phone]    Agencies that provide  inexpensive medical care: Organization         Address  Phone   Notes  [place]  [phone]   [facility]    [phone]   [facility] [place], [place] [address] [phone]   [place] [address] [place] [phone]   Planned Parenthood    [phone]   [facility]    [phone]   [place] and [address], [place] Phone:  [phone], Fax:  [phone] Hours of Operation:  9 am - 6 pm, M-F.  Also accepts Medicaid/Medicare and self-pay.  [facility] for [place], [address], [place] Phone: [phone], Fax: [phone]. Hours of Operation:  8:30 am - 5:30 pm, M-F.  Also accepts Medicaid and self-pay.  [facility] [address] [place] Phone: [phone]   [place], [place], [place] [phone], Ext. [phone] Mondays & Thursdays: 7-9 AM.  First 15 patients are seen on a first come, first serve basis.    [place] Providers:  Organization         Address  Phone   Notes  [facility] [address], [place] [phone] Also accepts self-pay patients.  [facility] [address], [place]  [phone]   [place], [address], [place] [phone]   [facility] [address], [place] [phone]   Lucianne Lei [address], [place]   [phone] Only accepts [place] Access [place] patients after they have their name applied to their card.   Self-Pay (no insurance) in [facility]:  Organization         Address  Phone   Notes  Sickle Cell Patients, [facility] [place] [phone]   [facility] [place] [phone]   [facility] [place]  [place], [place], [place] [phone]   [facility]/Dr. Osei-Bonsu  [address] [place] or [address], [place] [phone] Phone number for both [place] and [place] locations is the same.  Urgent Medical and [facility] [address] [place] [phone]   [facility] [address], [place] or [address] [phone]   [facility] [address], [place] [phone], phone; [phone], fax Sees patients 1st and 3rd Saturday of every month.  Must not qualify for public or private insurance (i.e. Medicaid, Medicare, [place] Health Choice, Veterans' Benefits)  Household income should be no more than 200% of the poverty level The clinic cannot treat you if you are pregnant or think you are pregnant  Sexually transmitted diseases are not treated at the clinic.    Dental Care: Organization  Address  Phone  Notes  [facility] [place], [place] [phone] Accepts children up to age 84 who are enrolled in [place] or [place]; pregnant women with a Medicaid card; and children who have applied for Medicaid or [place] Health Choice, but were declined, whose parents can pay a reduced fee at time of service.  [facility]  [address], [place] [phone] Accepts children up to age 2 who are enrolled in [place] or [place]; pregnant women with a Medicaid card; and children who have applied for Medicaid or [place] Health Choice, but were declined, whose parents can pay a reduced fee at time of service.  [place] [facility] Access PRO[facility]  [place] [phone] Patients are seen by appointment only. Walk-ins are  not accepted. [place] will see patients 68 years of age and older. Monday - Tuesday (8am-5pm) Most Wednesdays (8:30-5pm) $30 per visit, cash only  [facility] Adult [facility] Access PRO[facility]  [address], [facility] [phone] Patients are seen by appointment only. Walk-ins are not accepted. [place] will see patients 38 years of age and older. One Wednesday Evening (Monthly: Volunteer Based).  $30 per visit, cash only  [place]  [phone] for adults; Children under age 105, call Graduate Pediatric Dentistry at [phone]. Children aged 10-14, please call [phone] to request a pediatric application.  Dental services are provided in all areas of dental care including fillings, crowns and bridges, complete and partial dentures, implants, gum treatment, root canals, and extractions. Preventive care is also provided. Treatment is provided to both adults and children. Patients are selected via a lottery and there is often a waiting list.   [facility] [address] [place]  [phone] [email or web address]   [facility] [address] [place], [place] [phone], Ext. [phone] Second and Fourth Thursday of each month, opens at 6:30 AM; Clinic ends at 9 AM.  Patients are seen on a first-come first-served basis, and a limited number are seen during each clinic.   [facility]  [address] Hill[place] Danker [place], [place] [phone]   Eligibility Requirements You must have lived in [place], [place], or [place] counties for at least the last three months.   You cannot be eligible for state or federal sponsored [employer], including [employer], [place], or [employer].   You generally cannot be eligible for healthcare insurance through your employer.    How to apply: Eligibility screenings are held every Tuesday and Wednesday afternoon from 1:00 pm until 4:00 pm. You do not need an appointment for  the interview!  [facility] [address], [place], [place]   [place]  [place] Department  [place]  [phone]    Behavioral Health Resources in the Community: Intensive Outpatient Programs Organization         Address  Phone  Notes  [place]. [address] [place], [place] [phone]   [facility] [address], [place], [place]   ADS: Alcohol & Drug S[facility]cs [address], [place], [place]   [place] [address],  [place], [place] or [phone]   Substance Abuse Resources  Organization         Address  Phone  Notes  Alcohol and Drug Services  [place]  [phone]   [facility] [place]  [phone]   Chinita Pester  [phone]   Residential & Outpatient Substance Abuse Program  [phone]   Psychological Services Organization         Address  Phone  Notes  [facility]  [place]  [phone]   [place] [address] [place] or [phone]    Mobile Crisis Teams Organization         Address  Phone  Notes  [facility], Mobile Crisis Care Unit  [phone]   [facility]ive Psychotherapeutic Services  [address] [place], [place]   [place] Levels [address] [place] [phone]    Self-Help/Support Groups Organization         Address  Phone             Notes  [place]. of [place] - variety of support groups  [place] Call for more information  Narcotics Anonymous (NA), [facility] [address], [employer] [place]  2 meetings at this location   Special educational needs teacher         Address  Phone  Notes  ASAP Residential Treatment  [place],    [place]  [phone]   [facility]  [address] [place] [address], [place], [place]   [place], [place] [phone] Admissions: 8am-3pm M-F  Incentives Substance [place] [address],    [place], [place] [phone]   [facility] [address] [place], [place], [place]   [facility] [address],  [place], [place]   [facility] - Intensive Outpatient [address], [place], [place]   [facility] ([place].,  [place], [place] [phone] or [phone]   [facility] Services ([facility]S) [address] [place], [place] Accepts Medicaid  Fellowship  [address]  [place] [phone] Substance Abuse/Addiction Treatment   [facility] Organization         Address  Phone  Notes  [facility]  [phone]   Domenic Schwab, PhD [address] [place], [place]   [phone] or [phone]   [place], [place] [phone]   [facility] [address], [place], [place] [phone] Insurance/Medicaid/sponsorship through [facility] and Families [address] [place]                                    [place], [place] [phone] [place] [address], [place] [phone]    Dr. Adele Schilder  [phone]   [facility] Dept. 1) [address], [place] 2) [place] 3)  [place] [address], [place] [phone]  [phone]   [place] [phone] or [phone] (After Hours)

## 2016-01-27 ENCOUNTER — Emergency Department (HOSPITAL_BASED_OUTPATIENT_CLINIC_OR_DEPARTMENT_OTHER)
Admission: EM | Admit: 2016-01-27 | Discharge: 2016-01-28 | Disposition: A | Discharge status: Home / Self Care | Payer: BLUE CROSS/BLUE SHIELD | Attending: Emergency Medicine | Admitting: Emergency Medicine

## 2016-01-27 ENCOUNTER — Encounter (HOSPITAL_BASED_OUTPATIENT_CLINIC_OR_DEPARTMENT_OTHER): Payer: Self-pay | Admitting: *Deleted

## 2016-01-27 DIAGNOSIS — I10 Essential (primary) hypertension: Secondary | ICD-10-CM | POA: Diagnosis not present

## 2016-01-27 DIAGNOSIS — B009 Herpesviral infection, unspecified: Secondary | ICD-10-CM | POA: Insufficient documentation

## 2016-01-27 DIAGNOSIS — R21 Rash and other nonspecific skin eruption: Secondary | ICD-10-CM | POA: Diagnosis present

## 2016-01-27 HISTORY — DX: Herpesviral infection, unspecified: B00.9

## 2016-01-27 NOTE — ED Provider Notes (Signed)
CSN: [id number]     Arrival date & time 01/27/16  2220 History  By signing my name below, I, Robin Jarvis, attest that this documentation has been prepared under the direction and in the presence of Ruston Fedora, MD.  Electronically Signed: Gillis Ends. Lyn Hollingshead, ED Scribe. 01/27/2016. 12:01 AM.      Chief Complaint  Patient presents with  . Rash    Patient is a 40 y.o. female presenting with rash. The history is provided by the patient. No language interpreter was used.  Rash Location:  Mouth, torso and ano-genital Mouth rash location:  Upper outer lip Torso rash location:  Abd RLQ Ano-genital rash location:  Vagina Quality: blistering and swelling   Severity:  Moderate Onset quality:  Gradual Duration:  1 day Timing:  Constant Chronicity:  Recurrent Context: exposure to similar rash   Relieved by:  Nothing Worsened by:  Nothing tried Associated symptoms: no fever    HPI Comments: Robin Jarvis is a 40 y.o. female with PMHx of HTN and Herpes who presents to the Emergency Department complaining of gradual onset, constant, rash located on upper lip and right lower abdomen x 1 day. Pt reports she is having a Herpes outbreak, with last outbreak occurring 1 year ago. She is not on any suppressive medications. She has associated swelling and pain of upper lip and lesions on her vagina. There are no modifying factors. Pt has no other associated symptoms.  Past Medical History  Diagnosis Date  . Hypertension   . Migraine   . Herpes    Past Surgical History  Procedure Laterality Date  . Cesarean section    . Tubal ligation     No family history on file. Social History  Substance Use Topics  . Smoking status: Never Smoker   . Smokeless tobacco: Never Used  . Alcohol Use: 2.4 oz/week    4 Glasses of wine per week   OB History    No data available     Review of Systems  Constitutional: Negative for fever.  Skin: Positive for rash.  All other systems reviewed and are  negative.  Allergies  Lisinopril  Home Medications   Prior to Admission medications   Medication Sig Start Date End Date Taking? Authorizing Provider  hydrochlorothiazide (HYDRODIURIL) 25 MG tablet Take 25 mg by mouth daily.    Historical Provider, MD  meloxicam (MOBIC) 15 MG tablet Take 1 tablet (15 mg total) by mouth daily. 06/11/15   Shawn C Joy, PA-C  methocarbamol (ROBAXIN) 500 MG tablet Take 1 tablet (500 mg total) by mouth 2 (two) times daily. 06/11/15   Shawn C Joy, PA-C  topiramate (TOPAMAX) 50 MG tablet Take 50 mg by mouth 2 (two) times daily.    Historical Provider, MD   BP 223/144 mmHg  Pulse 98  Temp(Src) 100.1 F (37.8 C) (Oral)  Resp 20  Ht 5' 9.5" (1.765 m)  Wt 240 lb (108.863 kg)  BMI 34.95 kg/m2  SpO2 98%  LMP 01/24/2016 Physical Exam  Constitutional: She is oriented to person, place, and time. She appears well-developed and well-nourished. No distress.  HENT:  Head: Normocephalic and atraumatic.  Mouth/Throat: Oropharynx is clear and moist. No oropharyngeal exudate.  Moist mucous membranes. No lesions in mouth.  3 lesions at the upper vermillion border consistent with herpes labialis  Eyes: Conjunctivae are normal. Pupils are equal, round, and reactive to light.  Neck: Normal range of motion. Neck supple. No JVD present.  Trachea midline No bruit  Cardiovascular: Normal rate, regular rhythm and normal heart sounds.   Pulmonary/Chest: Effort normal and breath sounds normal. No stridor. No respiratory distress. She has no wheezes. She has no rales.  Abdominal: Soft. Bowel sounds are normal. She exhibits no distension. There is no tenderness. There is no rebound and no guarding.  Genitourinary:  States she has genital lesions but refuses exam as on cycle  Musculoskeletal: Normal range of motion.  Lymphadenopathy:    She has no cervical adenopathy.  Neurological: She is alert and oriented to person, place, and time. She has normal reflexes.  Skin: Skin is  warm and dry.  Blister 9mm deflated on RLQ of abdomen  Psychiatric: She has a normal mood and affect. Her behavior is normal.  Nursing note and vitals reviewed.   ED Course  Procedures (including critical care time) DIAGNOSTIC STUDIES: Oxygen Saturation is 98% on RA, normal by my interpretation.    COORDINATION OF CARE: 11:59 PM-Discussed treatment plan which includes order to Toradol and Valtrex with pt at bedside and pt agreed to plan.    MDM   Final diagnoses:  None   Filed Vitals:   01/27/16 2231 01/28/16 0105  BP: 223/144 224/100  Pulse: 98 89  Temp: 100.1 F (37.8 C)   Resp: 20 18    Results for orders placed or performed during the hospital encounter of 11/20/11  Urinalysis, Routine w reflex microscopic  Result Value Ref Range   Color, Urine YELLOW YELLOW   APPearance CLEAR CLEAR   Specific Gravity, Urine 1.027 1.005 - 1.030   pH 6.5 5.0 - 8.0   Glucose, UA NEGATIVE NEGATIVE mg/dL   Hgb urine dipstick NEGATIVE NEGATIVE   Bilirubin Urine NEGATIVE NEGATIVE   Ketones, ur NEGATIVE NEGATIVE mg/dL   Protein, ur NEGATIVE NEGATIVE mg/dL   Urobilinogen, UA 1.0 0.0 - 1.0 mg/dL   Nitrite NEGATIVE NEGATIVE   Leukocytes, UA NEGATIVE NEGATIVE  Pregnancy, urine  Result Value Ref Range   Preg Test, Ur NEGATIVE NEGATIVE   No results found.  Medications  valACYclovir (VALTREX) tablet 500 mg (500 mg Oral Given 01/28/16 0028)  ketorolac (TORADOL) injection 60 mg (60 mg Intramuscular Given 01/28/16 0028)     Medication List    TAKE these medications        meloxicam 15 MG tablet  Commonly known as:  MOBIC  Take 1 tablet (15 mg total) by mouth daily.     meloxicam 15 MG tablet  Commonly known as:  MOBIC  Take 1 tablet (15 mg total) by mouth daily.     penciclovir 1 % cream  Commonly known as:  DENAVIR  Apply 1 application topically every 2 (two) hours.     traMADol 50 MG tablet  Commonly known as:  ULTRAM  Take 1 tablet (50 mg total) by mouth every 6 (six)  hours as needed.     valACYclovir 500 MG tablet  Commonly known as:  VALTREX  Take 1 tablet (500 mg total) by mouth 2 (two) times daily.      ASK your doctor about these medications        hydrochlorothiazide 25 MG tablet  Commonly known as:  HYDRODIURIL  Take 25 mg by mouth daily.     methocarbamol 500 MG tablet  Commonly known as:  ROBAXIN  Take 1 tablet (500 mg total) by mouth 2 (two) times daily.     topiramate 50 MG tablet  Commonly known as:  TOPAMAX  Take 50 mg by mouth 2 (  two) times daily.        Started on valtrex for secondary outbreak. Will also start topical anti virals for lip outbreak and pain medication.  Follow up with your PMD in 2 days for recheck, also to discuss suppressive medication.  All questions answered.  Stable for discharge at this time.  Strict return precautions given   I personally performed the services described in this documentation, which was scribed in my presence. The recorded information has been reviewed and is accurate.      Cy Blamer, MD 01/28/16 [phone]

## 2016-01-27 NOTE — ED Notes (Signed)
She has a herpes outbreak. Painful swollen upper lip. She has a lesion on her abdomen and lesions on her vagina per pt.

## 2016-01-28 ENCOUNTER — Encounter (HOSPITAL_BASED_OUTPATIENT_CLINIC_OR_DEPARTMENT_OTHER): Payer: Self-pay | Admitting: Emergency Medicine

## 2016-01-28 MED ORDER — VALACYCLOVIR HCL 500 MG PO TABS: 500.0000 mg | ORAL_TABLET | Freq: Two times a day (BID) | ORAL | Status: DC

## 2016-01-28 MED ORDER — KETOROLAC TROMETHAMINE 60 MG/2ML IM SOLN
60.0000 mg | Freq: Once | INTRAMUSCULAR | Status: AC
  Administered 2016-01-28: 60 mg via INTRAMUSCULAR
  Filled 2016-01-28: qty 2

## 2016-01-28 MED ORDER — TRAMADOL HCL 50 MG PO TABS: 50.0000 mg | ORAL_TABLET | Freq: Four times a day (QID) | ORAL | Status: DC | PRN

## 2016-01-28 MED ORDER — VALACYCLOVIR HCL 500 MG PO TABS
500.0000 mg | ORAL_TABLET | Freq: Every day | ORAL | Status: DC
  Administered 2016-01-28: 500 mg via ORAL
  Filled 2016-01-28: qty 1

## 2016-01-28 MED ORDER — PENCICLOVIR 1 % EX CREA: 1.0000 "application " | TOPICAL_CREAM | CUTANEOUS | Status: DC

## 2016-01-28 MED ORDER — MELOXICAM 15 MG PO TABS: 15.0000 mg | ORAL_TABLET | Freq: Every day | ORAL | Status: DC

## 2016-01-28 NOTE — Discharge Instructions (Signed)
Herpes Simplex Virus Herpes simplex virus is a viral infection that may infect many different areas of the body, such as the genitalia and mouth. There are two different strains of the virus: herpes simplex virus 1 (HSV-1) and herpes simplex virus 2 (HSV-2). HSV-1 is typically associated with infections of the mouth and lips. HSV-2 is associated with infections of the genitals. However, either strain of the virus may infect any area. HSV may be spread through saliva particles or sexual contact. One unusual form of HSV-1, known as herpes gladiatorum, is passed from skin-to-skin contact, such as in wrestling. SYMPTOMS   Sometimes, no symptoms.  Fever.  Headache.  Muscle aches.  Tingling.  Itching.  Tenderness.  Genital burning feeling.  Genital pain.  Pain with urination.  Pain with sexual intercourse.  Small blisters in the affected areas. RISK FACTORS   Kissing an infected person.  Sharing eating utensils with an infected person.  Unprotected sexual activity.  Multiple sexual partners.  Direct contact sports without protective clothing.  Contact with an exposed herpes sore.  Stress, illness, and cold increase the risk of recurrence. PROGNOSIS  The primary outbreak of an HSV infection usually lasts 2 to 3 weeks. However, it has been known to last up to 6 weeks. After the primary outbreak subsides, the virus goes into a stage known as latency. During this time, there may be no physical symptoms of infection. After a period of time, some event, such as stress, cold, or illness will trigger another outbreak. This cycle of latency and outbreak may continue indefinitely. The outbreaks usually become milder over time. The body cannot rid itself of HSV. RELATED COMPLICATIONS   Recurrence.  Infection in other areas of the body, such as the eye (ocular herpetic infection, keratitis) and rarely the brain (herpetic encephalitis). TREATMENT  Many HSV infections can be treated  without medicine. During an outbreak, avoid touching the sores. Ice may be used to dull the pain and suppress the virus. Exposure to the sun is a common trigger for an outbreak, so the use of sunscreen may help in such cases. Avoid sexual contact during outbreaks. During the latent periods, it is advised that you use latex condoms, which will reduce the likelihood of spreading the virus to another person. Condoms made from animal products do not protect against HSV. Female condoms cover a larger area than female condoms, and may offer the most protection from the transmission of HSV. The presence of HSV will not affect a condom's ability to protect against pregnancy. Only take medicines for pain and discomfort if directed to do so by your caregiver. Many claims exist that certain dietary changes will prevent an outbreak, but these claims have not been proven. These claims include eating foods that are high in L-lysine and low in arginine (i.e. yogurt, beets, apples, pears, mangoes, oily fish (such as salmon, haddock, snapper, and swordfish), soybean sprouts, chicken, and tomatoes).  Athletes may return to play once they are showing no symptoms, and they have been treated.    This information is not intended to replace advice given to you by your health care provider. Make sure you discuss any questions you have with your health care provider.   Document Released: 07/30/2005 Document Revised: 10/22/2011 Document Reviewed: 02/16/2015 Elsevier Interactive Patient Education 2016 [employer]  

## 2019-03-09 ENCOUNTER — Emergency Department (HOSPITAL_BASED_OUTPATIENT_CLINIC_OR_DEPARTMENT_OTHER): Payer: BLUE CROSS/BLUE SHIELD

## 2019-03-09 ENCOUNTER — Other Ambulatory Visit: Payer: Self-pay

## 2019-03-09 ENCOUNTER — Emergency Department (HOSPITAL_BASED_OUTPATIENT_CLINIC_OR_DEPARTMENT_OTHER)
Admission: EM | Admit: 2019-03-09 | Discharge: 2019-03-09 | Disposition: A | Discharge status: Home / Self Care | Payer: BLUE CROSS/BLUE SHIELD | Attending: Emergency Medicine | Admitting: Emergency Medicine

## 2019-03-09 ENCOUNTER — Encounter (HOSPITAL_BASED_OUTPATIENT_CLINIC_OR_DEPARTMENT_OTHER): Payer: Self-pay | Admitting: Emergency Medicine

## 2019-03-09 DIAGNOSIS — Z79899 Other long term (current) drug therapy: Secondary | ICD-10-CM | POA: Insufficient documentation

## 2019-03-09 DIAGNOSIS — R509 Fever, unspecified: Secondary | ICD-10-CM

## 2019-03-09 DIAGNOSIS — Z20828 Contact with and (suspected) exposure to other viral communicable diseases: Secondary | ICD-10-CM | POA: Insufficient documentation

## 2019-03-09 DIAGNOSIS — I10 Essential (primary) hypertension: Secondary | ICD-10-CM | POA: Insufficient documentation

## 2019-03-09 DIAGNOSIS — R0789 Other chest pain: Secondary | ICD-10-CM

## 2019-03-09 LAB — URINALYSIS, ROUTINE W REFLEX MICROSCOPIC
Bilirubin Urine: NEGATIVE
Glucose, UA: NEGATIVE mg/dL
Ketones, ur: NEGATIVE mg/dL
Leukocytes,Ua: NEGATIVE
Nitrite: NEGATIVE
Protein, ur: 100 mg/dL — AB
Specific Gravity, Urine: 1.015 (ref 1.005–1.030)
pH: 7 (ref 5.0–8.0)

## 2019-03-09 LAB — BASIC METABOLIC PANEL
Anion gap: 11 (ref 5–15)
BUN: 9 mg/dL (ref 6–20)
CO2: 25 mmol/L (ref 22–32)
Calcium: 8.5 mg/dL — ABNORMAL LOW (ref 8.9–10.3)
Chloride: 100 mmol/L (ref 98–111)
Creatinine, Ser: 0.88 mg/dL (ref 0.44–1.00)
GFR calc Af Amer: >60 mL/min (ref >60)
GFR calc non Af Amer: >60 mL/min (ref >60)
Glucose, Bld: 112 mg/dL — ABNORMAL HIGH (ref 70–99)
Potassium: 3 mmol/L — ABNORMAL LOW (ref 3.5–5.1)
Sodium: 136 mmol/L (ref 135–145)

## 2019-03-09 LAB — URINALYSIS, MICROSCOPIC (REFLEX)

## 2019-03-09 LAB — CBC WITH DIFFERENTIAL/PLATELET
Abs Immature Granulocytes: 0.05 10*3/uL (ref 0.00–0.07)
Basophils Absolute: 0.1 10*3/uL (ref 0.0–0.1)
Basophils Relative: 1 %
Eosinophils Absolute: 0.1 10*3/uL (ref 0.0–0.5)
Eosinophils Relative: 0 %
HCT: 41.2 % (ref 36.0–46.0)
Hemoglobin: 13.7 g/dL (ref 12.0–15.0)
Immature Granulocytes: 0 %
Lymphocytes Relative: 11 %
Lymphs Abs: 1.8 10*3/uL (ref 0.7–4.0)
MCH: 30.2 pg (ref 26.0–34.0)
MCHC: 33.3 g/dL (ref 30.0–36.0)
MCV: 90.7 fL (ref 80.0–100.0)
Monocytes Absolute: 1.4 10*3/uL — ABNORMAL HIGH (ref 0.1–1.0)
Monocytes Relative: 9 %
Neutro Abs: 12.9 10*3/uL — ABNORMAL HIGH (ref 1.7–7.7)
Neutrophils Relative %: 79 %
Platelets: 295 10*3/uL (ref 150–400)
RBC: 4.54 MIL/uL (ref 3.87–5.11)
RDW: 13.1 % (ref 11.5–15.5)
WBC: 16.3 10*3/uL — ABNORMAL HIGH (ref 4.0–10.5)
nRBC: 0 % (ref 0.0–0.2)

## 2019-03-09 LAB — TROPONIN I (HIGH SENSITIVITY)
Troponin I (High Sensitivity): 24 ng/L — ABNORMAL HIGH (ref <18)
Troponin I (High Sensitivity): 25 ng/L — ABNORMAL HIGH (ref <18)

## 2019-03-09 LAB — SARS CORONAVIRUS 2 BY RT PCR (HOSPITAL ORDER, PERFORMED IN ~~LOC~~ HOSPITAL LAB): SARS Coronavirus 2: NEGATIVE

## 2019-03-09 MED ORDER — AZITHROMYCIN 250 MG PO TABS: 250.0000 mg | ORAL_TABLET | Freq: Every day | ORAL | 0 refills | Status: DC

## 2019-03-09 MED ORDER — IBUPROFEN 400 MG PO TABS
600.0000 mg | ORAL_TABLET | Freq: Once | ORAL | Status: AC
  Administered 2019-03-09: 600 mg via ORAL
  Filled 2019-03-09: qty 1

## 2019-03-09 MED ORDER — HYDRALAZINE HCL 20 MG/ML IJ SOLN
5.0000 mg | Freq: Once | INTRAMUSCULAR | Status: AC
  Administered 2019-03-09: 5 mg via INTRAVENOUS
  Filled 2019-03-09: qty 1

## 2019-03-09 MED ORDER — AMLODIPINE BESYLATE 5 MG PO TABS
5.0000 mg | ORAL_TABLET | Freq: Once | ORAL | Status: AC
  Administered 2019-03-09: 05:00:00 5 mg via ORAL
  Filled 2019-03-09: qty 1

## 2019-03-09 MED ORDER — AMLODIPINE BESYLATE 5 MG PO TABS: 5.0000 mg | ORAL_TABLET | Freq: Every day | ORAL | 0 refills | Status: DC

## 2019-03-09 MED ORDER — HYDROCHLOROTHIAZIDE 25 MG PO TABS: 25.0000 mg | ORAL_TABLET | Freq: Every day | ORAL | 0 refills | Status: DC

## 2019-03-09 NOTE — ED Notes (Signed)
ED Provider at bedside. 

## 2019-03-09 NOTE — Discharge Instructions (Addendum)
You were seen today with fever, shortness of breath, chest pain.  You were notably hypertensive.  However the rest of your work-up is largely reassuring.  You will be started on 2 blood pressure medications.  Check your blood pressure once every several days just to monitor.  Follow-up with your primary physician.  If you begin to feel poorly, develop chest pain, shortness of breath, any new or worsening symptoms you should be reevaluated.  Additionally, given your fever and white count with chest pain, you will be given a antibiotic to cover for pneumonia.  Take as directed.

## 2019-03-09 NOTE — ED Notes (Signed)
X RAY at bedside 

## 2019-03-09 NOTE — ED Triage Notes (Addendum)
Pt reports fever, shortness of breath, back pain radiating to chest, and fatigue worsening over the last week, reports she works at [employer]. Fever MAX101.

## 2019-03-09 NOTE — ED Provider Notes (Signed)
[facility] EMERGENCY DEPARTMENT Provider Note   CSN: [id number] Arrival date & time: 03/09/19  9562    History   Chief Complaint Chief Complaint  Patient presents with   Fever    HPI Robin Jarvis is a 43 y.o. female.     HPI  This is a 43 year old female with a history of hypertension who presents with fever, shortness of breath, chest pain, myalgias.  Patient reports onset of symptoms over the last several days.  Reports T-max of 101.  Denies any cough.  No known sick contacts but does work at [employer].  She states that she always wears a mask and no known sick contacts.  Patient reports waxing and waning pain in her chest which she describes as pressure and radiating sometimes to her back.  She states that she cannot seem to get comfortable.  It is not exertional.  Currently she rates her pain at 5 out of 10.  It has been ongoing for several days on and off.  Past Medical History:  Diagnosis Date   Herpes    Hypertension    Migraine     There are no active problems to display for this patient.   Past Surgical History:  Procedure Laterality Date   CESAREAN SECTION     TUBAL LIGATION       OB History   No obstetric history on file.      Home Medications    Prior to Admission medications   Medication Sig Start Date End Date Taking? Authorizing Provider  meloxicam (MOBIC) 15 MG tablet Take 1 tablet (15 mg total) by mouth daily. 01/28/16  Yes Palumbo, April, MD  amLODipine (NORVASC) 5 MG tablet Take 1 tablet (5 mg total) by mouth daily. 03/09/19   My Rinke, Mayer Masker, MD  azithromycin (ZITHROMAX) 250 MG tablet Take 1 tablet (250 mg total) by mouth daily. Take first 2 tablets together, then 1 every day until finished. 03/09/19   Mckenley Birenbaum, Mayer Masker, MD  hydrochlorothiazide (HYDRODIURIL) 25 MG tablet Take 1 tablet (25 mg total) by mouth daily. 03/09/19   Earnie Rockhold, Mayer Masker, MD  meloxicam (MOBIC) 15 MG tablet Take 1 tablet (15 mg total) by mouth daily.  06/11/15   Joy, Shawn C, PA-C  methocarbamol (ROBAXIN) 500 MG tablet Take 1 tablet (500 mg total) by mouth 2 (two) times daily. 06/11/15   Joy, Shawn C, PA-C  penciclovir (DENAVIR) 1 % cream Apply 1 application topically every 2 (two) hours. 01/28/16   Palumbo, April, MD  topiramate (TOPAMAX) 50 MG tablet Take 50 mg by mouth 2 (two) times daily.    [provider]  traMADol (ULTRAM) 50 MG tablet Take 1 tablet (50 mg total) by mouth every 6 (six) hours as needed. 01/28/16   Palumbo, April, MD  valACYclovir (VALTREX) 500 MG tablet Take 1 tablet (500 mg total) by mouth 2 (two) times daily. 01/28/16   Palumbo, April, MD    Family History History reviewed. No pertinent family history.  Social History Social History   Tobacco Use   Smoking status: Never Smoker   Smokeless tobacco: Never Used  Substance Use Topics   Alcohol use: Yes    Alcohol/week: 4.0 standard drinks    Types: 4 Glasses of wine per week   Drug use: No     Allergies   Lisinopril   Review of Systems Review of Systems  Constitutional: Positive for chills and fever.  HENT: Negative for sinus pressure.   Respiratory: Positive for shortness  of breath. Negative for cough.   Cardiovascular: Positive for chest pain. Negative for leg swelling.  Gastrointestinal: Negative for abdominal pain, nausea and vomiting.  Genitourinary: Negative for dysuria.  Musculoskeletal: Positive for back pain and myalgias.  Skin: Negative for rash.  Neurological: Negative for numbness.  All other systems reviewed and are negative.    Physical Exam Updated Vital Signs BP (!) 183/105    Pulse 75    Temp 99.6 F (37.6 C) (Oral)    Resp 18    Ht 1.753 m (5\' 9" )    Wt 109.3 kg    SpO2 100%    BMI 35.59 kg/m   Physical Exam Vitals signs and nursing note reviewed.  Constitutional:      Appearance: She is well-developed. She is obese.  HENT:     Head: Normocephalic and atraumatic.     Nose: Nose normal.     Mouth/Throat:      Mouth: Mucous membranes are moist.  Eyes:     Pupils: Pupils are equal, round, and reactive to light.  Neck:     Musculoskeletal: Normal range of motion and neck supple.  Cardiovascular:     Rate and Rhythm: Normal rate and regular rhythm.     Heart sounds: Normal heart sounds.  Pulmonary:     Effort: Pulmonary effort is normal. No respiratory distress.     Breath sounds: No wheezing.  Abdominal:     General: Bowel sounds are normal.     Palpations: Abdomen is soft.  Musculoskeletal:        General: No tenderness.     Right lower leg: No edema.     Left lower leg: No edema.  Skin:    General: Skin is warm and dry.  Neurological:     Mental Status: She is alert and oriented to person, place, and time.  Psychiatric:        Mood and Affect: Mood normal.      ED Treatments / Results  Labs (all labs ordered are listed, but only abnormal results are displayed) Labs Reviewed  CBC WITH DIFFERENTIAL/PLATELET - Abnormal; Notable for the following components:      Result Value   WBC 16.3 (*)    Neutro Abs 12.9 (*)    Monocytes Absolute 1.4 (*)    All other components within normal limits  BASIC METABOLIC PANEL - Abnormal; Notable for the following components:   Potassium 3.0 (*)    Glucose, Bld 112 (*)    Calcium 8.5 (*)    All other components within normal limits  URINALYSIS, ROUTINE W REFLEX MICROSCOPIC - Abnormal; Notable for the following components:   Hgb urine dipstick TRACE (*)    Protein, ur 100 (*)    All other components within normal limits  URINALYSIS, MICROSCOPIC (REFLEX) - Abnormal; Notable for the following components:   Bacteria, UA RARE (*)    All other components within normal limits  TROPONIN I (HIGH SENSITIVITY) - Abnormal; Notable for the following components:   Troponin I (High Sensitivity) 24 (*)    All other components within normal limits  TROPONIN I (HIGH SENSITIVITY) - Abnormal; Notable for the following components:   Troponin I (High  Sensitivity) 25 (*)    All other components within normal limits  SARS CORONAVIRUS 2 (HOSPITAL ORDER[facility])    EKG EKG Interpretation  Date/Time:  Monday March 09 2019 03:50:46 EDT Ventricular Rate:  84 PR Interval:    QRS Duration:  91 QT Interval:  406 QTC Calculation: 480 R Axis:     Text Interpretation:  Sinus rhythm Left atrial enlargement Probable left ventricular hypertrophy Anterior Q waves, possibly due to LVH Confirmed by Thayer Jew [phone]) on 03/09/2019 4:02:03 AM   Radiology Dg Chest Portable 1 View  Result Date: 03/09/2019 CLINICAL DATA:  Fever and chest pain EXAM: PORTABLE CHEST 1 VIEW COMPARISON:  09/14/2012 is most recent available FINDINGS: Cardiomegaly. Negative mediastinal contours. There is no edema, consolidation, effusion, or pneumothorax. IMPRESSION: No evidence of pneumonia. Cardiomegaly. Electronically Signed   By: Monte Fantasia M.D.   On: 03/09/2019 04:35    Procedures Procedures (including critical care time)  CRITICAL CARE Performed by: Merryl Hacker   Total critical care time: 40 minutes  Critical care time was exclusive of separately billable procedures and treating other patients.  Critical care was necessary to treat or prevent imminent or life-threatening deterioration.  Critical care was time spent personally by me on the following activities: development of treatment plan with patient and/or surrogate as well as nursing, discussions with consultants, evaluation of patient's response to treatment, examination of patient, obtaining history from patient or surrogate, ordering and performing treatments and interventions, ordering and review of laboratory studies, ordering and review of radiographic studies, pulse oximetry and re-evaluation of patient's condition.  Medications Ordered in ED Medications  hydrALAZINE (APRESOLINE) injection 5 mg (5 mg Intravenous Given 03/09/19 0404)  amLODipine (NORVASC)  tablet 5 mg (5 mg Oral Given 03/09/19 0517)  ibuprofen (ADVIL) tablet 600 mg (600 mg Oral Given 03/09/19 9381)     Initial Impression / Assessment and Plan / ED Course  I have reviewed the triage vital signs and the nursing notes.  Pertinent labs & imaging results that were available during my care of the patient were reviewed by me and considered in my medical decision making (see chart for details).        This is a 43 year old female who presents with fever, chest pain, shortness of breath.  She is overall nontoxic-appearing.  She is significantly hypertensive on initial evaluation.  History of the same.  Not currently on any medications.  Her EKG does not show any acute ischemic changes or arrhythmia.  She was given hydralazine and Norvasc given that she has chest pain as this may be related to a hypertensive crisis.  Initial troponin is 24.  This may be related to her blood pressure.  She also presents with fever.  She was tested for COVID and negative.  Chest x-ray is clear but she has a leukocytosis of 16.  Given this and fever with chest pain, would elect to cover for community-acquired pneumonia.  Patient had progressive improvement of her symptoms.  Repeat troponin is 25.  Technically her heart score is 2 for obesity and hypertension.  Given that she has improved, I feel that some of her symptoms may have been related to hypertension; however, they could also be related to upper respiratory infection or pneumonia.  Given that she is a mixed picture and clinically has improved as well as her blood pressure improving 183/105, feel she is safe for discharge home.  We will have her follow-up with her primary physician as scheduled.  Recommend checking blood pressure every several days and or if she begins to feel bad again.  Additionally, cardiology follow-up given slight troponin elevation although stable.  Patient is agreeable to plan.  Should be discharged home with azithromycin, Norvasc, and  HCTZ.  Final Clinical Impressions(s) /  ED Diagnoses   Final diagnoses:  Essential hypertension  Fever, unspecified fever cause  Atypical chest pain    ED Discharge Orders         Ordered    amLODipine (NORVASC) 5 MG tablet  Daily     03/09/19 0648    hydrochlorothiazide (HYDRODIURIL) 25 MG tablet  Daily     03/09/19 0648    azithromycin (ZITHROMAX) 250 MG tablet  Daily     03/09/19 0648           Shon BatonHorton, Kynadie Yaun F, MD 03/09/19 [phone]

## 2019-11-09 ENCOUNTER — Encounter (HOSPITAL_BASED_OUTPATIENT_CLINIC_OR_DEPARTMENT_OTHER): Payer: Self-pay | Admitting: *Deleted

## 2019-11-09 ENCOUNTER — Emergency Department (HOSPITAL_BASED_OUTPATIENT_CLINIC_OR_DEPARTMENT_OTHER): Payer: Self-pay

## 2019-11-09 ENCOUNTER — Other Ambulatory Visit: Payer: Self-pay

## 2019-11-09 ENCOUNTER — Inpatient Hospital Stay (HOSPITAL_BASED_OUTPATIENT_CLINIC_OR_DEPARTMENT_OTHER)
Admission: EM | Admit: 2019-11-09 | Discharge: 2019-11-14 | DRG: 305 | Disposition: A | Discharge status: Home / Self Care | Payer: Self-pay | Attending: Internal Medicine | Admitting: Internal Medicine

## 2019-11-09 DIAGNOSIS — T465X6A Underdosing of other antihypertensive drugs, initial encounter: Secondary | ICD-10-CM | POA: Diagnosis present

## 2019-11-09 DIAGNOSIS — Y92009 Unspecified place in unspecified non-institutional (private) residence as the place of occurrence of the external cause: Secondary | ICD-10-CM

## 2019-11-09 DIAGNOSIS — N289 Disorder of kidney and ureter, unspecified: Secondary | ICD-10-CM | POA: Diagnosis present

## 2019-11-09 DIAGNOSIS — Z91128 Patient's intentional underdosing of medication regimen for other reason: Secondary | ICD-10-CM

## 2019-11-09 DIAGNOSIS — Z79899 Other long term (current) drug therapy: Secondary | ICD-10-CM

## 2019-11-09 DIAGNOSIS — Z791 Long term (current) use of non-steroidal anti-inflammatories (NSAID): Secondary | ICD-10-CM

## 2019-11-09 DIAGNOSIS — I160 Hypertensive urgency: Secondary | ICD-10-CM

## 2019-11-09 DIAGNOSIS — I5032 Chronic diastolic (congestive) heart failure: Secondary | ICD-10-CM | POA: Diagnosis present

## 2019-11-09 DIAGNOSIS — Z20822 Contact with and (suspected) exposure to covid-19: Secondary | ICD-10-CM | POA: Diagnosis present

## 2019-11-09 DIAGNOSIS — I517 Cardiomegaly: Secondary | ICD-10-CM | POA: Diagnosis present

## 2019-11-09 DIAGNOSIS — R7989 Other specified abnormal findings of blood chemistry: Secondary | ICD-10-CM | POA: Diagnosis present

## 2019-11-09 DIAGNOSIS — I701 Atherosclerosis of renal artery: Secondary | ICD-10-CM

## 2019-11-09 DIAGNOSIS — R635 Abnormal weight gain: Secondary | ICD-10-CM | POA: Diagnosis present

## 2019-11-09 DIAGNOSIS — E876 Hypokalemia: Secondary | ICD-10-CM | POA: Diagnosis present

## 2019-11-09 DIAGNOSIS — Z9114 Patient's other noncompliance with medication regimen: Secondary | ICD-10-CM

## 2019-11-09 DIAGNOSIS — I110 Hypertensive heart disease with heart failure: Secondary | ICD-10-CM | POA: Diagnosis present

## 2019-11-09 DIAGNOSIS — R079 Chest pain, unspecified: Secondary | ICD-10-CM | POA: Diagnosis present

## 2019-11-09 DIAGNOSIS — R0600 Dyspnea, unspecified: Secondary | ICD-10-CM | POA: Diagnosis present

## 2019-11-09 DIAGNOSIS — Z888 Allergy status to other drugs, medicaments and biological substances status: Secondary | ICD-10-CM

## 2019-11-09 DIAGNOSIS — I161 Hypertensive emergency: Principal | ICD-10-CM | POA: Diagnosis present

## 2019-11-09 LAB — COMPREHENSIVE METABOLIC PANEL
ALT: 32 U/L (ref 0–44)
AST: 34 U/L (ref 15–41)
Albumin: 3.2 g/dL — ABNORMAL LOW (ref 3.5–5.0)
Alkaline Phosphatase: 51 U/L (ref 38–126)
Anion gap: 11 (ref 5–15)
BUN: 15 mg/dL (ref 6–20)
CO2: 28 mmol/L (ref 22–32)
Calcium: 8.9 mg/dL (ref 8.9–10.3)
Chloride: 100 mmol/L (ref 98–111)
Creatinine, Ser: 0.96 mg/dL (ref 0.44–1.00)
GFR calc Af Amer: >60 mL/min (ref >60)
GFR calc non Af Amer: >60 mL/min (ref >60)
Glucose, Bld: 100 mg/dL — ABNORMAL HIGH (ref 70–99)
Potassium: 2.6 mmol/L — CL (ref 3.5–5.1)
Sodium: 139 mmol/L (ref 135–145)
Total Bilirubin: 1.4 mg/dL — ABNORMAL HIGH (ref 0.3–1.2)
Total Protein: 6.4 g/dL — ABNORMAL LOW (ref 6.5–8.1)

## 2019-11-09 LAB — CBC
HCT: 37.9 % (ref 36.0–46.0)
Hemoglobin: 13.2 g/dL (ref 12.0–15.0)
MCH: 32.4 pg (ref 26.0–34.0)
MCHC: 34.8 g/dL (ref 30.0–36.0)
MCV: 92.9 fL (ref 80.0–100.0)
Platelets: 230 10*3/uL (ref 150–400)
RBC: 4.08 MIL/uL (ref 3.87–5.11)
RDW: 14.3 % (ref 11.5–15.5)
WBC: 10.8 10*3/uL — ABNORMAL HIGH (ref 4.0–10.5)
nRBC: 0 % (ref 0.0–0.2)

## 2019-11-09 LAB — TROPONIN I (HIGH SENSITIVITY)
Troponin I (High Sensitivity): 32 ng/L — ABNORMAL HIGH
Troponin I (High Sensitivity): 34 ng/L — ABNORMAL HIGH (ref <18)

## 2019-11-09 LAB — BRAIN NATRIURETIC PEPTIDE: B Natriuretic Peptide: 713 pg/mL — ABNORMAL HIGH (ref 0.0–100.0)

## 2019-11-09 LAB — RESPIRATORY PANEL BY RT PCR (FLU A&B, COVID)
Influenza A by PCR: NEGATIVE
Influenza B by PCR: NEGATIVE
SARS Coronavirus 2 by RT PCR: NEGATIVE

## 2019-11-09 MED ORDER — POTASSIUM CHLORIDE 10 MEQ/100ML IV SOLN
10.0000 meq | INTRAVENOUS | Status: AC
  Administered 2019-11-09 – 2019-11-10 (×3): 10 meq via INTRAVENOUS
  Filled 2019-11-09 (×3): qty 100

## 2019-11-09 MED ORDER — LABETALOL HCL 5 MG/ML IV SOLN
10.0000 mg | INTRAVENOUS | Status: AC | PRN
  Administered 2019-11-10 (×3): 10 mg via INTRAVENOUS
  Filled 2019-11-09 (×3): qty 4

## 2019-11-09 MED ORDER — LABETALOL HCL 5 MG/ML IV SOLN
10.0000 mg | Freq: Once | INTRAVENOUS | Status: AC
  Administered 2019-11-09: 10 mg via INTRAVENOUS
  Filled 2019-11-09: qty 4

## 2019-11-09 MED ORDER — NITROGLYCERIN 2 % TD OINT
1.0000 [in_us] | TOPICAL_OINTMENT | Freq: Once | TRANSDERMAL | Status: AC
  Administered 2019-11-09: 1 [in_us] via TOPICAL
  Filled 2019-11-09: qty 1

## 2019-11-09 MED ORDER — LABETALOL HCL 5 MG/ML IV SOLN: 10.0000 mg | INTRAVENOUS | Status: DC | PRN

## 2019-11-09 MED ORDER — POTASSIUM CHLORIDE CRYS ER 20 MEQ PO TBCR
40.0000 meq | EXTENDED_RELEASE_TABLET | Freq: Once | ORAL | Status: AC
  Administered 2019-11-09: 40 meq via ORAL
  Filled 2019-11-09: qty 2

## 2019-11-09 NOTE — ED Triage Notes (Signed)
Pt reports chest tightness, SOB x 3 days. Reports vomiting x 1, decreased appetite. Denies diarrhea. Denies cough, fever.

## 2019-11-09 NOTE — ED Provider Notes (Signed)
[facility] Emergency Department Provider Note MRN:  [id number]  Arrival date & time: 11/09/19     Chief Complaint   Shortness of Breath   History of Present Illness   Robin Jarvis is a 44 y.o. year-old female with a history of hypertension presenting to the ED with chief complaint of shortness of breath.  Chest pain and shortness of breath for the past 3 days, described as a pressure.  Worse when laying flat.  Explains that she was hospitalized last year and was told to take high blood pressure medications but never refilled the medications.  Has not been on antihypertensive medications since summer 2020.  Denies headache or vision change, no fever, no cough, no abdominal pain, no numbness or weakness.  Symptoms constant, moderate.  Review of Systems  A complete 10 system review of systems was obtained and all systems are negative except as noted in the HPI and PMH.   Patient's Health History    Past Medical History:  Diagnosis Date  . Herpes   . Hypertension   . Migraine     Past Surgical History:  Procedure Laterality Date  . CESAREAN SECTION    . TUBAL LIGATION      History reviewed. No pertinent family history.  Social History   Socioeconomic History  . Marital status: Single    Spouse name: Not on file  . Number of children: Not on file  . Years of education: Not on file  . Highest education level: Not on file  Occupational History  . Not on file  Tobacco Use  . Smoking status: Never Smoker  . Smokeless tobacco: Never Used  Substance and Sexual Activity  . Alcohol use: Yes    Alcohol/week: 4.0 standard drinks    Types: 4 Glasses of wine per week  . Drug use: No  . Sexual activity: Yes  Other Topics Concern  . Not on file  Social History Narrative  . Not on file   Social Determinants of Health   Financial Resource Strain:   . Difficulty of Paying Living Expenses:   Food Insecurity:   . Worried About Charity fundraiser in  the Last Year:   . Arboriculturist in the Last Year:   Transportation Needs:   . Film/video editor (Medical):   Marland Kitchen Lack of Transportation (Non-Medical):   Physical Activity:   . Days of Exercise per Week:   . Minutes of Exercise per Session:   Stress:   . Feeling of Stress :   Social Connections:   . Frequency of Communication with Friends and Family:   . Frequency of Social Gatherings with Friends and Family:   . Attends Religious Services:   . Active Member of Clubs or Organizations:   . Attends Archivist Meetings:   Marland Kitchen Marital Status:   Intimate Partner Violence:   . Fear of Current or Ex-Partner:   . Emotionally Abused:   Marland Kitchen Physically Abused:   . Sexually Abused:      Physical Exam   Vitals:   11/09/19 2250 11/09/19 2322  BP: (!) 236/140 (!) 223/137  Pulse: 69 74  Resp: (!) 29 (!) 24  Temp:    SpO2: 100% 100%    CONSTITUTIONAL: Well-appearing, NAD NEURO:  Alert and oriented x 3, no focal deficits EYES:  eyes equal and reactive ENT/NECK:  no LAD, no JVD CARDIO: Regular rate, well-perfused, normal S1 and S2 PULM:  CTAB no wheezing or  rhonchi GI/GU:  normal bowel sounds, non-distended, non-tender MSK/SPINE:  No gross deformities, no edema SKIN:  no rash, atraumatic PSYCH:  Appropriate speech and behavior  *Additional and/or pertinent findings included in MDM below  Diagnostic and Interventional Summary    EKG Interpretation  Date/Time:  Monday November 09 2019 19:38:50 EDT Ventricular Rate:  89 PR Interval:  140 QRS Duration: 96 QT Interval:  394 QTC Calculation: 479 R Axis:   10 Text Interpretation: Normal sinus rhythm Minimal voltage criteria for LVH, may be normal variant ( Cornell product ) Nonspecific ST and T wave abnormality Prolonged QT Abnormal ECG No previous ECGs available Confirmed by Kennis Carina [phone]) on 11/09/2019 7:59:34 PM      Labs Reviewed  CBC - Abnormal; Notable for the following components:      Result Value   WBC  10.8 (*)    All other components within normal limits  COMPREHENSIVE METABOLIC PANEL - Abnormal; Notable for the following components:   Potassium 2.6 (*)    Glucose, Bld 100 (*)    Total Protein 6.4 (*)    Albumin 3.2 (*)    Total Bilirubin 1.4 (*)    All other components within normal limits  BRAIN NATRIURETIC PEPTIDE - Abnormal; Notable for the following components:   B Natriuretic Peptide 713.0 (*)    All other components within normal limits  TROPONIN I (HIGH SENSITIVITY) - Abnormal; Notable for the following components:   Troponin I (High Sensitivity) 32 (*)    All other components within normal limits  TROPONIN I (HIGH SENSITIVITY) - Abnormal; Notable for the following components:   Troponin I (High Sensitivity) 34 (*)    All other components within normal limits  RESPIRATORY PANEL BY RT PCR (FLU A&B, COVID)    DG Chest Port 1 View  Final Result      Medications  potassium chloride 10 mEq in 100 mL IVPB (10 mEq Intravenous New Bag/Given 11/09/19 2234)  nitroGLYCERIN (NITROGLYN) 2 % ointment 1 inch (1 inch Topical Given 11/09/19 2012)  labetalol (NORMODYNE) injection 10 mg (10 mg Intravenous Given 11/09/19 2221)  potassium chloride SA (KLOR-CON) CR tablet 40 mEq (40 mEq Oral Given 11/09/19 2220)     Procedures  /  Critical Care .Critical Care Performed by: Sabas Sous, MD Authorized by: Sabas Sous, MD   Critical care provider statement:    Critical care time (minutes):  45   Critical care was necessary to treat or prevent imminent or life-threatening deterioration of the following conditions:  Circulatory failure (Hypertensive urgency)   Critical care was time spent personally by me on the following activities:  Discussions with consultants, evaluation of patient's response to treatment, examination of patient, ordering and performing treatments and interventions, ordering and review of laboratory studies, ordering and review of radiographic studies, pulse oximetry,  re-evaluation of patient's condition, obtaining history from patient or surrogate and review of old charts    ED Course and Medical Decision Making  I have reviewed the triage vital signs, the nursing notes, and pertinent available records from the EMR.  Pertinent labs & imaging results that were available during my care of the patient were reviewed by me and considered in my medical decision making (see below for details).     EKG revealing LVH with some depressions laterally, patient's presentation particularly regarding her noncompliance with antihypertensives and her severe hypertension are concerning for hypertensive urgency or emergency.  Neurologically she is doing well, no headache or vision change, normal  exam.  Will obtain labs, troponin, provide nitroglycerin initially for symptom and blood pressure control, will consider further antihypertensive agents.  Suspect need for admission.  Labs reveal mildly elevated troponin, BNP elevation, continued significant hypertension with nitroglycerin paste improving systolic to 230s.  Goal systolic is about 200 to reduce initial blood pressure by 20%.  Will provide labetalol IV, admitted to hospital service for further care.  Elmer Sow. Pilar Plate, MD [facility] Emergency Medicine [facility] [email or web address]  Final Clinical Impressions(s) / ED Diagnoses     ICD-10-CM   1. Hypertensive urgency  I16.0     ED Discharge Orders    None       Discharge Instructions Discussed with and Provided to Patient:   Discharge Instructions   None       Sabas Sous, MD 11/09/19 2322

## 2019-11-09 NOTE — Progress Notes (Signed)
44 year old female with hypertension was not taking her medications in the summer 2020.  She has been having chest pains and progressive shortness of breath with orthopnea for the past 3 days.  She finally came to the ER her blood pressure initially was 255/154.  She has been given nitro with scant improvement in her systolic blood pressure is now 230.  Her EKG shows nonspecific changes.  Her potassium is 2.6 she is received 40 mg potassium p.o. and 10 IV.  Chest x-ray shows some no evidence of CHF.  Troponin mildly elevated at 32.  BNP 713

## 2019-11-10 ENCOUNTER — Inpatient Hospital Stay (HOSPITAL_COMMUNITY): Payer: Self-pay

## 2019-11-10 DIAGNOSIS — R635 Abnormal weight gain: Secondary | ICD-10-CM

## 2019-11-10 DIAGNOSIS — I161 Hypertensive emergency: Principal | ICD-10-CM

## 2019-11-10 DIAGNOSIS — R0600 Dyspnea, unspecified: Secondary | ICD-10-CM

## 2019-11-10 DIAGNOSIS — E876 Hypokalemia: Secondary | ICD-10-CM | POA: Diagnosis present

## 2019-11-10 DIAGNOSIS — R079 Chest pain, unspecified: Secondary | ICD-10-CM

## 2019-11-10 DIAGNOSIS — R7989 Other specified abnormal findings of blood chemistry: Secondary | ICD-10-CM | POA: Diagnosis present

## 2019-11-10 DIAGNOSIS — I517 Cardiomegaly: Secondary | ICD-10-CM | POA: Diagnosis present

## 2019-11-10 LAB — BASIC METABOLIC PANEL
Anion gap: 10 (ref 5–15)
BUN: 11 mg/dL (ref 6–20)
CO2: 27 mmol/L (ref 22–32)
Calcium: 8.3 mg/dL — ABNORMAL LOW (ref 8.9–10.3)
Chloride: 102 mmol/L (ref 98–111)
Creatinine, Ser: 1.06 mg/dL — ABNORMAL HIGH (ref 0.44–1.00)
GFR calc Af Amer: >60 mL/min (ref >60)
GFR calc non Af Amer: >60 mL/min (ref >60)
Glucose, Bld: 109 mg/dL — ABNORMAL HIGH (ref 70–99)
Potassium: 3 mmol/L — ABNORMAL LOW (ref 3.5–5.1)
Sodium: 139 mmol/L (ref 135–145)

## 2019-11-10 LAB — CBC
HCT: 38.1 % (ref 36.0–46.0)
Hemoglobin: 12.9 g/dL (ref 12.0–15.0)
MCH: 31.6 pg (ref 26.0–34.0)
MCHC: 33.9 g/dL (ref 30.0–36.0)
MCV: 93.4 fL (ref 80.0–100.0)
Platelets: 248 10*3/uL (ref 150–400)
RBC: 4.08 MIL/uL (ref 3.87–5.11)
RDW: 14.3 % (ref 11.5–15.5)
WBC: 15 10*3/uL — ABNORMAL HIGH (ref 4.0–10.5)
nRBC: 0 % (ref 0.0–0.2)

## 2019-11-10 LAB — ECHOCARDIOGRAM COMPLETE
Height: 69 in
Weight: 4063.52 oz

## 2019-11-10 LAB — TSH: TSH: 1.709 u[IU]/mL (ref 0.350–4.500)

## 2019-11-10 LAB — HIV ANTIBODY (ROUTINE TESTING W REFLEX): HIV Screen 4th Generation wRfx: NONREACTIVE

## 2019-11-10 LAB — MAGNESIUM: Magnesium: 1.7 mg/dL (ref 1.7–2.4)

## 2019-11-10 LAB — D-DIMER, QUANTITATIVE: D-Dimer, Quant: 0.51 ug/mL-FEU — ABNORMAL HIGH (ref 0.00–0.50)

## 2019-11-10 MED ORDER — ALBUTEROL SULFATE (2.5 MG/3ML) 0.083% IN NEBU: 2.5000 mg | INHALATION_SOLUTION | Freq: Four times a day (QID) | RESPIRATORY_TRACT | Status: DC | PRN

## 2019-11-10 MED ORDER — ACETAMINOPHEN 325 MG PO TABS
650.0000 mg | ORAL_TABLET | Freq: Four times a day (QID) | ORAL | Status: DC | PRN
  Administered 2019-11-10 – 2019-11-14 (×14): 650 mg via ORAL
  Filled 2019-11-10 (×14): qty 2

## 2019-11-10 MED ORDER — ACETAMINOPHEN 325 MG PO TABS
650.0000 mg | ORAL_TABLET | Freq: Once | ORAL | Status: AC
  Administered 2019-11-10: 650 mg via ORAL
  Filled 2019-11-10: qty 2

## 2019-11-10 MED ORDER — ENOXAPARIN SODIUM 40 MG/0.4ML ~~LOC~~ SOLN
40.0000 mg | SUBCUTANEOUS | Status: DC
  Administered 2019-11-10 – 2019-11-14 (×5): 40 mg via SUBCUTANEOUS
  Filled 2019-11-10 (×5): qty 0.4

## 2019-11-10 MED ORDER — POTASSIUM CHLORIDE 10 MEQ/100ML IV SOLN
10.0000 meq | INTRAVENOUS | Status: AC
  Administered 2019-11-10 (×3): 10 meq via INTRAVENOUS
  Filled 2019-11-10 (×3): qty 100

## 2019-11-10 MED ORDER — IOHEXOL 350 MG/ML SOLN
80.0000 mL | Freq: Once | INTRAVENOUS | Status: AC | PRN
  Administered 2019-11-10: 80 mL via INTRAVENOUS

## 2019-11-10 MED ORDER — ACETAMINOPHEN 650 MG RE SUPP: 650.0000 mg | Freq: Four times a day (QID) | RECTAL | Status: DC | PRN

## 2019-11-10 MED ORDER — HYDRALAZINE HCL 20 MG/ML IJ SOLN
20.0000 mg | INTRAMUSCULAR | Status: DC | PRN
  Administered 2019-11-10 – 2019-11-12 (×7): 20 mg via INTRAVENOUS
  Filled 2019-11-10 (×7): qty 1

## 2019-11-10 MED ORDER — ONDANSETRON HCL 4 MG/2ML IJ SOLN
4.0000 mg | Freq: Once | INTRAMUSCULAR | Status: AC
  Administered 2019-11-10: 4 mg via INTRAVENOUS
  Filled 2019-11-10: qty 2

## 2019-11-10 MED ORDER — AMLODIPINE BESYLATE 10 MG PO TABS
10.0000 mg | ORAL_TABLET | Freq: Every day | ORAL | Status: DC
  Administered 2019-11-10 – 2019-11-14 (×5): 10 mg via ORAL
  Filled 2019-11-10 (×5): qty 1

## 2019-11-10 MED ORDER — ONDANSETRON HCL 4 MG/2ML IJ SOLN
4.0000 mg | Freq: Four times a day (QID) | INTRAMUSCULAR | Status: DC | PRN
  Administered 2019-11-11: 4 mg via INTRAVENOUS
  Filled 2019-11-10: qty 2

## 2019-11-10 MED ORDER — HYDRALAZINE HCL 25 MG PO TABS
25.0000 mg | ORAL_TABLET | Freq: Three times a day (TID) | ORAL | Status: DC
  Administered 2019-11-10 – 2019-11-11 (×5): 25 mg via ORAL
  Filled 2019-11-10 (×5): qty 1

## 2019-11-10 MED ORDER — SODIUM CHLORIDE 0.9% FLUSH
3.0000 mL | Freq: Two times a day (BID) | INTRAVENOUS | Status: DC
  Administered 2019-11-10 – 2019-11-14 (×9): 3 mL via INTRAVENOUS

## 2019-11-10 MED ORDER — POTASSIUM CHLORIDE CRYS ER 20 MEQ PO TBCR
60.0000 meq | EXTENDED_RELEASE_TABLET | ORAL | Status: AC
  Administered 2019-11-10: 60 meq via ORAL
  Filled 2019-11-10: qty 3

## 2019-11-10 MED ORDER — ONDANSETRON HCL 4 MG PO TABS: 4.0000 mg | ORAL_TABLET | Freq: Four times a day (QID) | ORAL | Status: DC | PRN

## 2019-11-10 NOTE — Progress Notes (Signed)
  Echocardiogram 2D Echocardiogram has been performed.  Robin Jarvis 11/10/2019, 3:55 PM

## 2019-11-10 NOTE — H&P (Signed)
History and Physical    Robin Jarvis VOZ:[id number] DOB: 05/11/1976 DOA: 11/09/2019  Referring MD/NP/PA: Quintella Baton, MD PCP: System, Provider Not In  Patient coming from: Transfer from [facility]  Chief Complaint: Shortness of breath  I have personally briefly reviewed patient's old medical records in [place]   HPI: Robin Jarvis is a 44 y.o. female with medical history significant of hypertension and herpes simplex who presented with 3 days shortness of breath.  Patient complained of having tightness across her chest.  Laying down on her left side seem to help some.  She had previously been  on blood pressure medications, but lacked insurance and never was able to establish care with a primary care provider to continue these.  Associated symptoms included, headache, nausea, diaphoresis, leg swelling, calf pain, and 15 pound weight gain over the last 2 months.  She initially attributed her leg pain and swelling to her being on her feet all day where she works at [employer].  Patient has never smoked tobacco, but does not normally have 1 glass of wine per day on average.   ED Course: Upon admission to the emergency department patient was noted to be tachypneic with blood pressures elevated up to 255/154.  Labs on 3/29 significant for WBC 10.8, potassium 2.6, high-sensitivity troponin  32->34, BNP 713, and total bilirubin 1.4.  Chest x-ray noted stable cardiomegaly without any acute abnormality.  Influenza and COVID-19 screening were negative. . Review of Systems  Constitutional: Positive for malaise/fatigue. Negative for fever.       Positive for weight gain  HENT: Negative for ear discharge and nosebleeds.   Eyes: Negative for photophobia and pain.  Respiratory: Positive for shortness of breath. Negative for cough.   Cardiovascular: Positive for chest pain and leg swelling.  Gastrointestinal: Positive for nausea. Negative for constipation, diarrhea and vomiting.  Genitourinary: Negative  for dysuria and hematuria.  Musculoskeletal: Negative for falls.  Skin: Negative for rash.  Neurological: Positive for headaches. Negative for loss of consciousness.  Endo/Heme/Allergies: Negative for polydipsia.  Psychiatric/Behavioral: Negative for memory loss and suicidal ideas.    Past Medical History:  Diagnosis Date  . Herpes   . Hypertension   . Migraine     Past Surgical History:  Procedure Laterality Date  . CESAREAN SECTION    . TUBAL LIGATION       reports that she has never smoked. She has never used smokeless tobacco. She reports current alcohol use of about 4.0 standard drinks of alcohol per week. She reports that she does not use drugs.  Allergies  Allergen Reactions  . Lisinopril Swelling    History reviewed. No pertinent family history.  Prior to Admission medications   Medication Sig Start Date End Date Taking? Authorizing Provider  amLODipine (NORVASC) 5 MG tablet Take 1 tablet (5 mg total) by mouth daily. 03/09/19   Horton, Barbette Hair, MD  azithromycin (ZITHROMAX) 250 MG tablet Take 1 tablet (250 mg total) by mouth daily. Take first 2 tablets together, then 1 every day until finished. 03/09/19   Horton, Barbette Hair, MD  hydrochlorothiazide (HYDRODIURIL) 25 MG tablet Take 1 tablet (25 mg total) by mouth daily. 03/09/19   Horton, Barbette Hair, MD  meloxicam (MOBIC) 15 MG tablet Take 1 tablet (15 mg total) by mouth daily. 06/11/15   Joy, Shawn C, PA-C  meloxicam (MOBIC) 15 MG tablet Take 1 tablet (15 mg total) by mouth daily. 01/28/16   Palumbo, April, MD  methocarbamol (ROBAXIN) 500 MG  tablet Take 1 tablet (500 mg total) by mouth 2 (two) times daily. 06/11/15   Joy, Shawn C, PA-C  penciclovir (DENAVIR) 1 % cream Apply 1 application topically every 2 (two) hours. 01/28/16   Palumbo, April, MD  topiramate (TOPAMAX) 50 MG tablet Take 50 mg by mouth 2 (two) times daily.    [provider]  traMADol (ULTRAM) 50 MG tablet Take 1 tablet (50 mg total) by mouth every  6 (six) hours as needed. 01/28/16   Palumbo, April, MD  valACYclovir (VALTREX) 500 MG tablet Take 1 tablet (500 mg total) by mouth 2 (two) times daily. 01/28/16   Palumbo, April, MD    Physical Exam:  Constitutional: Middle-aged female NAD, calm, comfortable Vitals:   11/10/19 0845 11/10/19 1001 11/10/19 1005 11/10/19 1056  BP: (!) 232/132  (!) 220/135 (!) 217/129  Pulse: 71  66   Resp: 18  16   Temp:   99.1 F (37.3 C)   TempSrc:   Oral   SpO2: 99%  100%   Weight:  115.2 kg    Height:  5\' 9"  (1.753 m)     Eyes: PERRL, lids and conjunctivae normal ENMT: Mucous membranes are moist. Posterior pharynx clear of any exudate or lesions.  Neck: normal, supple, no masses, no thyromegaly Respiratory: clear to auscultation bilaterally, no wheezing, no crackles. Normal respiratory effort. No accessory muscle use.  Cardiovascular: Regular rate and rhythm, no murmurs / rubs / gallops.  Trace lower extremity edema. 2+ pedal pulses. No carotid bruits.  Abdomen: no tenderness, no masses palpated. No hepatosplenomegaly. Bowel sounds positive.  Musculoskeletal: no clubbing / cyanosis. No joint deformity upper and lower extremities. Good ROM, no contractures. Normal muscle tone.  Skin: no rashes, lesions, ulcers. No induration Neurologic: CN 2-12 grossly intact. Sensation intact, DTR normal. Strength 5/5 in all 4.  Psychiatric: Normal judgment and insight. Alert and oriented x 3. Normal mood.     Labs on Admission: I have personally reviewed following labs and imaging studies  CBC: Recent Labs  Lab 11/09/19 2020  WBC 10.8*  HGB 13.2  HCT 37.9  MCV 92.9  PLT 230   Basic Metabolic Panel: Recent Labs  Lab 11/09/19 2020  NA 139  K 2.6*  CL 100  CO2 28  GLUCOSE 100*  BUN 15  CREATININE 0.96  CALCIUM 8.9   GFR: Estimated Creatinine Clearance: 104.5 mL/min (by C-G formula based on SCr of 0.96 mg/dL). Liver Function Tests: Recent Labs  Lab 11/09/19 2020  AST 34  ALT 32  ALKPHOS 51   BILITOT 1.4*  PROT 6.4*  ALBUMIN 3.2*   No results for input(s): LIPASE, AMYLASE in the last 168 hours. No results for input(s): AMMONIA in the last 168 hours. Coagulation Profile: No results for input(s): INR, PROTIME in the last 168 hours. Cardiac Enzymes: No results for input(s): CKTOTAL, CKMB, CKMBINDEX, TROPONINI in the last 168 hours. BNP (last 3 results) No results for input(s): PROBNP in the last 8760 hours. HbA1C: No results for input(s): HGBA1C in the last 72 hours. CBG: No results for input(s): GLUCAP in the last 168 hours. Lipid Profile: No results for input(s): CHOL, HDL, LDLCALC, TRIG, CHOLHDL, LDLDIRECT in the last 72 hours. Thyroid Function Tests: No results for input(s): TSH, T4TOTAL, FREET4, T3FREE, THYROIDAB in the last 72 hours. Anemia Panel: No results for input(s): VITAMINB12, FOLATE, FERRITIN, TIBC, IRON, RETICCTPCT in the last 72 hours. Urine analysis:    Component Value Date/Time   COLORURINE YELLOW 03/09/2019 0357  APPEARANCEUR CLEAR 03/09/2019 0357   LABSPEC 1.015 03/09/2019 0357   PHURINE 7.0 03/09/2019 0357   GLUCOSEU NEGATIVE 03/09/2019 0357   HGBUR TRACE (A) 03/09/2019 0357   BILIRUBINUR NEGATIVE 03/09/2019 0357   KETONESUR NEGATIVE 03/09/2019 0357   PROTEINUR 100 (A) 03/09/2019 0357   UROBILINOGEN 1.0 11/20/2011 0814   NITRITE NEGATIVE 03/09/2019 0357   LEUKOCYTESUR NEGATIVE 03/09/2019 0357   Sepsis Labs: Recent Results (from the past 240 hour(s))  Respiratory Panel by RT PCR (Flu A&B, Covid) - Nasopharyngeal Swab     Status: None   Collection Time: 11/09/19 10:25 PM   Specimen: Nasopharyngeal Swab  Result Value Ref Range Status   SARS Coronavirus 2 by RT PCR NEGATIVE NEGATIVE Final    Comment: (NOTE) SARS-CoV-2 target nucleic acids are NOT DETECTED. The SARS-CoV-2 RNA is generally detectable in upper respiratoy specimens during the acute phase of infection. The lowest concentration of SARS-CoV-2 viral copies this assay can detect  is 131 copies/mL. A negative result does not preclude SARS-Cov-2 infection and should not be used as the sole basis for treatment or other patient management decisions. A negative result may occur with  improper specimen collection/handling, submission of specimen other than nasopharyngeal swab, presence of viral mutation(s) within the areas targeted by this assay, and inadequate number of viral copies (<131 copies/mL). A negative result must be combined with clinical observations, patient history, and epidemiological information. The expected result is Negative. Fact Sheet for Patients:  https://www.moore.com/ Fact Sheet for Healthcare Providers:  https://www.young.biz/ This test is not yet ap proved or cleared by the [place] FDA and  has been authorized for detection and/or diagnosis of SARS-CoV-2 by FDA under an Emergency Use Authorization (EUA). This EUA will remain  in effect (meaning this test can be used) for the duration of the COVID-19 declaration under Section 564(b)(1) of the Act, 21 U.S.C. section 360bbb-3(b)(1), unless the authorization is terminated or revoked sooner.    Influenza A by PCR NEGATIVE NEGATIVE Final   Influenza B by PCR NEGATIVE NEGATIVE Final    Comment: (NOTE) The Xpert Xpress SARS-CoV-2/FLU/RSV assay is intended as an aid in  the diagnosis of influenza from Nasopharyngeal swab specimens and  should not be used as a sole basis for treatment. Nasal washings and  aspirates are unacceptable for Xpert Xpress SARS-CoV-2/FLU/RSV  testing. Fact Sheet for Patients: https://www.moore.com/ Fact Sheet for Healthcare Providers: https://www.young.biz/ This test is not yet approved or cleared by the [place] FDA and  has been authorized for detection and/or diagnosis of SARS-CoV-2 by  FDA under an Emergency Use Authorization (EUA). This EUA will remain  in effect (meaning this  test can be used) for the duration of the  Covid-19 declaration under Section 564(b)(1) of the Act, 21  U.S.C. section 360bbb-3(b)(1), unless the authorization is  terminated or revoked. Performed at [facility], [address] [place], [place] [address]      Radiological Exams on Admission: DG Chest [facility] 1 View  Result Date: 11/09/2019 CLINICAL DATA:  Chest pain and shortness of breath for several days EXAM: PORTABLE CHEST 1 VIEW COMPARISON:  03/09/2019 FINDINGS: Cardiac shadow is enlarged but stable. The lungs are clear bilaterally. No acute bony abnormality is noted. IMPRESSION: Stable cardiomegaly.  No acute abnormality seen. Electronically Signed   By: Alcide Clever M.D.   On: 11/09/2019 20:22    EKG: Independently reviewed.  Normal sinus rhythm at 89 bpm with LVH  Assessment/Plan Hypertensive emergency: Patient presented with blood pressures  elevated to 255/154.  Patient had previously been recommended to be on amlodipine 5 mg and hydrochlorothiazide 25 mg daily.  It appears patient has had low potassium levels in addition to her hypertension.  Question possibility of primary aldosteronism. -Admit to a progressive bed  -Check aldosterone renin activity tomorrow at 8 AM -Start amlodipine 10 mg daily and hydralazine 25 mg 3 times daily -Hydralazine IV prn elevated blood pressures greater than 180   Cardiomegaly, elevated BNP: Acute.  On physical exam patient has trace lower extremity edema.  Chest x-ray noted stable cardiomegaly without signs of edema, but BNP elevated at 715.  Heart failure could be causing patient's weight gain and leg swelling. -Monitor intake and output -Daily weights -Check echocardiogram -Will give Lasix 40 mg x 1 dose  -Reassess and determine if further diuresis needed in a.m.  Chest pain, elevated troponin: Patient did report complaints of chest pain.  High-sensitivity troponins noted to be 32-> 34.  Suspect secondary to demand. -Check  D-dimer  Dyspnea: Patient reports feeling short of breath.  Chest x-ray otherwise noted to be clear.  O2 saturations otherwise maintained on room air.  No history of smoking. -Check stat CT angiogram of the chest if D-dimer elevated   Weight gain: Patient reports having 15 pound weight gain over the last 2 months. -Check TSH  Hypokalemia: Acute.  Initial potassium noted to be 2.6.  Patient was given 30 mEq IV and 40 mEq p.o. -Recheck potassium and magnesium -Replace as needed   DVT prophylaxis: Lovenox Code Status: Full Family Communication: No family requested to be updated at this time Disposition Plan: Possible discharge home in 2 to 3 days Consults called: None Admission status: Inpatient  Clydie Braun MD [facility] Pager [phone]   If 7PM-7AM, please contact night-coverage [email or web address] Password [facility]  11/10/2019, 11:47 AM

## 2019-11-11 ENCOUNTER — Inpatient Hospital Stay (HOSPITAL_COMMUNITY): Payer: Self-pay

## 2019-11-11 DIAGNOSIS — R7989 Other specified abnormal findings of blood chemistry: Secondary | ICD-10-CM

## 2019-11-11 LAB — LIPID PANEL
Cholesterol: 175 mg/dL (ref 0–200)
HDL: 36 mg/dL — ABNORMAL LOW (ref >40)
LDL Cholesterol: 120 mg/dL — ABNORMAL HIGH (ref 0–99)
Total CHOL/HDL Ratio: 4.9 RATIO
Triglycerides: 93 mg/dL (ref <150)
VLDL: 19 mg/dL (ref 0–40)

## 2019-11-11 LAB — HEMOGLOBIN A1C
Hgb A1c MFr Bld: 5.4 % (ref 4.8–5.6)
Mean Plasma Glucose: 108.28 mg/dL

## 2019-11-11 LAB — PROCALCITONIN: Procalcitonin: <0.1 ng/mL

## 2019-11-11 LAB — TSH: TSH: 2.975 u[IU]/mL (ref 0.350–4.500)

## 2019-11-11 MED ORDER — MAGNESIUM OXIDE 400 (241.3 MG) MG PO TABS
800.0000 mg | ORAL_TABLET | Freq: Three times a day (TID) | ORAL | Status: AC
  Administered 2019-11-11 (×3): 800 mg via ORAL
  Filled 2019-11-11 (×3): qty 2

## 2019-11-11 MED ORDER — POTASSIUM CHLORIDE CRYS ER 20 MEQ PO TBCR
40.0000 meq | EXTENDED_RELEASE_TABLET | ORAL | Status: AC
  Administered 2019-11-11 (×3): 40 meq via ORAL
  Filled 2019-11-11 (×3): qty 2

## 2019-11-11 MED ORDER — FUROSEMIDE 10 MG/ML IJ SOLN
40.0000 mg | Freq: Two times a day (BID) | INTRAMUSCULAR | Status: DC
  Administered 2019-11-11 – 2019-11-13 (×6): 40 mg via INTRAVENOUS
  Filled 2019-11-11 (×6): qty 4

## 2019-11-11 NOTE — Progress Notes (Signed)
PROGRESS NOTE    Robin Jarvis  QJJ:[id number] DOB: Dec 27, 1975 DOA: 11/09/2019 PCP: System, Provider Not In   Brief Narrative:  44 year old with history of essential hypertension, herpes simplex came to the hospital with complaints of dyspnea on exertion and chest tightness.  She was noted to have elevated blood pressure reporting of headaches.  Upon admission she was in hypertensive emergency, hypokalemia, elevated BNP.  Chest x-ray was suggestive of cardiomegaly.  Elevated D-dimer, CTA was negative for PE but showed cardiomegaly with small pleural effusion and concerns for pulmonary edema.   Assessment & Plan:   Principal Problem:   Hypertensive emergency Active Problems:   Dyspnea   Chest pain   Weight gain   Cardiomegaly   Elevated brain natriuretic peptide (BNP) level   Hypokalemia  Hypertensive emergency History of essential hypertension -Slowly normalize blood pressure over the course of next several days -Now on Norvasc 10 mg daily, hydralazine 25 mg 3 times daily -IV hydralazine as needed -Previously not taking antihypertensives-medical noncompliance -Rule out renal artery stenosis- US Dopplers ordered.   Dyspnea on exertion with cardiomegaly Chronic diastolic congestive heart failure with preserved ejection fraction 55% Elevated BNP -Echocardiogram showed EF of 55%, grade 1 diastolic dysfunction, severe LVH, moderate hypokinesis of the left ventricular wall.  Elevated pulmonary arterial systolic pressure. -Lasix 40 mg IV twice daily, fluid restriction -Aggressively replete electrolytes as necessary -LDL 120, A1c 5.4, TSH normal, procalcitonin-negative    DVT prophylaxis: Lovenox Code Status: Full code Family Communication: None Disposition Plan:   Patient From= home  Patient Anticipated D/C place= Home  Barriers= maintain hospital stay for aggressive IV diuretics, quite elevated blood pressure.  She needs better control and slow correction   Subjective: Tells  me her headaches are slightly better this morning but still has some orthopnea.  Review of Systems Otherwise negative except as per HPI, including: General: Denies fever, chills, night sweats or unintended weight loss. Resp: Denies cough, wheezing, shortness of breath. Cardiac: Denies chest pain, palpitations, orthopnea, paroxysmal nocturnal dyspnea. GI: Denies abdominal pain, nausea, vomiting, diarrhea or constipation GU: Denies dysuria, frequency, hesitancy or incontinence MS: Denies muscle aches, joint pain or swelling Neuro: Denies headache, neurologic deficits (focal weakness, numbness, tingling), abnormal gait Psych: Denies anxiety, depression, SI/HI/AVH Skin: Denies new rashes or lesions ID: Denies sick contacts, exotic exposures, travel  Examination:  General exam: Appears calm and comfortable  Respiratory system: Clear to auscultation. Respiratory effort normal. Cardiovascular system: S1 & S2 heard, RRR. No JVD, murmurs, rubs, gallops or clicks. No pedal edema. Gastrointestinal system: Abdomen is nondistended, soft and nontender. No organomegaly or masses felt. Normal bowel sounds heard. Central nervous system: Alert and oriented. No focal neurological deficits. Extremities: Symmetric 5 x 5 power. Skin: No rashes, lesions or ulcers Psychiatry: Judgement and insight appear normal. Mood & affect appropriate.     Objective: Vitals:   11/10/19 2258 11/11/19 0019 11/11/19 0509 11/11/19 0550  BP: (!) 197/118 (!) 192/101 (!) 209/113 (!) 190/98  Pulse: 76 82 78 85  Resp:  18 18   Temp:  99.2 F (37.3 C) 99.4 F (37.4 C)   TempSrc:  Oral Oral   SpO2:  98% 100% 100%  Weight:   113.9 kg   Height:        Intake/Output Summary (Last 24 hours) at 11/11/2019 0755 Last data filed at 11/11/2019 0500 Gross per 24 hour  Intake 820 ml  Output 400 ml  Net 420 ml   Filed Weights   11/09/19 1941 11/10/19  1001 11/11/19 0509  Weight: 108.9 kg 115.2 kg 113.9 kg     Data Reviewed:    CBC: Recent Labs  Lab 11/09/19 2020 11/10/19 1314  WBC 10.8* 15.0*  HGB 13.2 12.9  HCT 37.9 38.1  MCV 92.9 93.4  PLT 230 248   Basic Metabolic Panel: Recent Labs  Lab 11/09/19 2020 11/10/19 1314  NA 139 139  K 2.6* 3.0*  CL 100 102  CO2 28 27  GLUCOSE 100* 109*  BUN 15 11  CREATININE 0.96 1.06*  CALCIUM 8.9 8.3*  MG  --  1.7   GFR: Estimated Creatinine Clearance: 94.1 mL/min (A) (by C-G formula based on SCr of 1.06 mg/dL (H)). Liver Function Tests: Recent Labs  Lab 11/09/19 2020  AST 34  ALT 32  ALKPHOS 51  BILITOT 1.4*  PROT 6.4*  ALBUMIN 3.2*   No results for input(s): LIPASE, AMYLASE in the last 168 hours. No results for input(s): AMMONIA in the last 168 hours. Coagulation Profile: No results for input(s): INR, PROTIME in the last 168 hours. Cardiac Enzymes: No results for input(s): CKTOTAL, CKMB, CKMBINDEX, TROPONINI in the last 168 hours. BNP (last 3 results) No results for input(s): PROBNP in the last 8760 hours. HbA1C: No results for input(s): HGBA1C in the last 72 hours. CBG: No results for input(s): GLUCAP in the last 168 hours. Lipid Profile: No results for input(s): CHOL, HDL, LDLCALC, TRIG, CHOLHDL, LDLDIRECT in the last 72 hours. Thyroid Function Tests: Recent Labs    11/10/19 1314  TSH 1.709   Anemia Panel: No results for input(s): VITAMINB12, FOLATE, FERRITIN, TIBC, IRON, RETICCTPCT in the last 72 hours. Sepsis Labs: No results for input(s): PROCALCITON, LATICACIDVEN in the last 168 hours.  Recent Results (from the past 240 hour(s))  Respiratory Panel by RT PCR (Flu A&B, Covid) - Nasopharyngeal Swab     Status: None   Collection Time: 11/09/19 10:25 PM   Specimen: Nasopharyngeal Swab  Result Value Ref Range Status   SARS Coronavirus 2 by RT PCR NEGATIVE NEGATIVE Final    Comment: (NOTE) SARS-CoV-2 target nucleic acids are NOT DETECTED. The SARS-CoV-2 RNA is generally detectable in upper respiratoy specimens during the acute  phase of infection. The lowest concentration of SARS-CoV-2 viral copies this assay can detect is 131 copies/mL. A negative result does not preclude SARS-Cov-2 infection and should not be used as the sole basis for treatment or other patient management decisions. A negative result may occur with  improper specimen collection/handling, submission of specimen other than nasopharyngeal swab, presence of viral mutation(s) within the areas targeted by this assay, and inadequate number of viral copies (<131 copies/mL). A negative result must be combined with clinical observations, patient history, and epidemiological information. The expected result is Negative. Fact Sheet for Patients:  https://www.moore.com/ Fact Sheet for Healthcare Providers:  https://www.young.biz/ This test is not yet ap proved or cleared by the [place] FDA and  has been authorized for detection and/or diagnosis of SARS-CoV-2 by FDA under an Emergency Use Authorization (EUA). This EUA will remain  in effect (meaning this test can be used) for the duration of the COVID-19 declaration under Section 564(b)(1) of the Act, 21 U.S.C. section 360bbb-3(b)(1), unless the authorization is terminated or revoked sooner.    Influenza A by PCR NEGATIVE NEGATIVE Final   Influenza B by PCR NEGATIVE NEGATIVE Final    Comment: (NOTE) The Xpert Xpress SARS-CoV-2/FLU/RSV assay is intended as an aid in  the diagnosis of influenza from Nasopharyngeal swab specimens  and  should not be used as a sole basis for treatment. Nasal washings and  aspirates are unacceptable for Xpert Xpress SARS-CoV-2/FLU/RSV  testing. Fact Sheet for Patients: https://www.moore.com/ Fact Sheet for Healthcare Providers: https://www.young.biz/ This test is not yet approved or cleared by the [place] FDA and  has been authorized for detection and/or diagnosis of SARS-CoV-2 by    FDA under an Emergency Use Authorization (EUA). This EUA will remain  in effect (meaning this test can be used) for the duration of the  Covid-19 declaration under Section 564(b)(1) of the Act, 21  U.S.C. section 360bbb-3(b)(1), unless the authorization is  terminated or revoked. Performed at [facility], [address], [place], [place] [address]          Radiology Studies: CT ANGIO CHEST PE W OR WO CONTRAST  Result Date: 11/10/2019 CLINICAL DATA:  Shortness of breath. Chest pain for 3 days. Positive D-dimer. EXAM: CT ANGIOGRAPHY CHEST WITH CONTRAST TECHNIQUE: Multidetector CT imaging of the chest was performed using the standard protocol during bolus administration of intravenous contrast. Multiplanar CT image reconstructions and MIPs were obtained to evaluate the vascular anatomy. CONTRAST:  55mL OMNIPAQUE IOHEXOL 350 MG/ML SOLN COMPARISON:  Chest radiograph of 1 day prior. No prior CT. FINDINGS: Cardiovascular: The quality of this exam for evaluation of pulmonary embolism is moderate. The bolus is moderately well timed, with a large amount of contrast remaining in the SVC. Degradation secondary to patient size. To the upper lungs, no pulmonary embolism to the large segmental level. No subsegmental embolism to the lower lungs. Pulmonary artery enlargement, outflow tract 3.8 cm. The aorta is grossly normal in caliber, but not well opacified. Moderate cardiomegaly, with right sided chamber enlargement. Contrast reflux in the IVC and hepatic veins. Mediastinum/Nodes: No well-defined mediastinal adenopathy. Suspect mediastinal edema, possibly related to fluid overload. No hilar adenopathy. Lungs/Pleura: Small left and trace right pleural fluid. Mosaic attenuation, favored to be related to air trapping and hypoventilation. Upper Abdomen: Normal imaged portions of the liver, spleen, stomach, pancreas, adrenal glands, kidneys. Musculoskeletal: Moderate thoracic spondylosis. Review of the  MIP images confirms the above findings. IMPRESSION: 1. Moderate quality exam for evaluation of pulmonary embolism. No evidence of pulmonary embolism, with limitations above. 2. Pulmonary artery enlargement suggests pulmonary arterial hypertension. Cardiomegaly with elevated right heart pressures. 3. Small left and trace right pleural fluid. Question fluid overload. Electronically Signed   By: Jeronimo Greaves M.D.   On: 11/10/2019 18:21   DG Chest Port 1 View  Result Date: 11/09/2019 CLINICAL DATA:  Chest pain and shortness of breath for several days EXAM: PORTABLE CHEST 1 VIEW COMPARISON:  03/09/2019 FINDINGS: Cardiac shadow is enlarged but stable. The lungs are clear bilaterally. No acute bony abnormality is noted. IMPRESSION: Stable cardiomegaly.  No acute abnormality seen. Electronically Signed   By: Alcide Clever M.D.   On: 11/09/2019 20:22   ECHOCARDIOGRAM COMPLETE  Result Date: 11/10/2019    ECHOCARDIOGRAM REPORT   Patient Name:   Robin Jarvis Date of Exam: 11/10/2019 Medical Rec #:  [id number]   Height:       69.0 in Accession #:    [id number]  Weight:       254.0 lb Date of Birth:  June 12, 1976   BSA:          2.287 m Patient Age:    43 years    BP:           193/96 mmHg Patient Gender: F  HR:           71 bpm. Exam Location:  Inpatient Procedure: 2D Echo, Cardiac Doppler and Color Doppler Indications:    R07.9* Chest pain, unspecified  History:        Patient has prior history of Echocardiogram examinations, most                 recent 05/04/2008. Cardiomegaly, Signs/Symptoms:Dyspnea; Risk                 Factors:Hypertension.  Sonographer:    Roseanna Rainbow RDCS Referring Phys: [id number] [place]  1. Left ventricular ejection fraction, by estimation, is 50 to 55%. The left ventricle has low normal function. The left ventricle has no regional wall motion abnormalities. There is severe left ventricular hypertrophy. Left ventricular diastolic parameters are consistent with Grade I diastolic  dysfunction (impaired relaxation). Elevated left ventricular end-diastolic pressure. There is moderate hypokinesis of the left ventricular, entire septal wall.  2. Right ventricular systolic function is normal. The right ventricular size is normal. There is moderately elevated pulmonary artery systolic pressure.  3. Left atrial size was mildly dilated.  4. Right atrial size was mildly dilated.  5. The mitral valve is grossly normal. Trivial mitral valve regurgitation.  6. Tricuspid valve regurgitation is moderate.  7. The aortic valve was not well visualized. Aortic valve regurgitation is not visualized.  8. The inferior vena cava is dilated in size with <50% respiratory variability, suggesting right atrial pressure of 15 mmHg.  9. Trivial to small circumferential pericardial effusion. Tamponade is not suspected.S FINDINGS  Left Ventricle: Left ventricular ejection fraction, by estimation, is 50 to 55%. The left ventricle has low normal function. The left ventricle has no regional wall motion abnormalities. Moderate hypokinesis of the left ventricular, entire septal wall. The left ventricular internal cavity size was normal in size. There is severe left ventricular hypertrophy. Left ventricular diastolic parameters are consistent with Grade I diastolic dysfunction (impaired relaxation). Elevated left ventricular end-diastolic pressure. Right Ventricle: The right ventricular size is normal. No increase in right ventricular wall thickness. Right ventricular systolic function is normal. There is moderately elevated pulmonary artery systolic pressure. The tricuspid regurgitant velocity is 3.47 m/s, and with an assumed right atrial pressure of 15 mmHg, the estimated right ventricular systolic pressure is 51.7 mmHg. Left Atrium: Left atrial size was mildly dilated. Right Atrium: Right atrial size was mildly dilated. Pericardium: A small pericardial effusion is present. The pericardial effusion is circumferential. There is  no evidence of cardiac tamponade. Mitral Valve: The mitral valve is grossly normal. Trivial mitral valve regurgitation. Tricuspid Valve: The tricuspid valve is grossly normal. Tricuspid valve regurgitation is moderate. Aortic Valve: The aortic valve was not well visualized. Aortic valve regurgitation is not visualized. Pulmonic Valve: The pulmonic valve was grossly normal. Pulmonic valve regurgitation is trivial. Aorta: The aortic root and ascending aorta are structurally normal, with no evidence of dilitation. Venous: The inferior vena cava is dilated in size with less than 50% respiratory variability, suggesting right atrial pressure of 15 mmHg. IAS/Shunts: No atrial level shunt detected by color flow Doppler.  LEFT VENTRICLE PLAX 2D LVIDd:         4.60 cm      Diastology LVIDs:         3.35 cm      LV e' lateral:   5.56 cm/s LV PW:         1.82 cm      LV E/e'  lateral: 23.4 LV IVS:        1.79 cm      LV e' medial:    7.40 cm/s LVOT diam:     2.00 cm      LV E/e' medial:  17.6 LV SV:         58 LV SV Index:   26 LVOT Area:     3.14 cm  LV Volumes (MOD) LV vol d, MOD A2C: 103.0 ml LV vol d, MOD A4C: 120.0 ml LV vol s, MOD A2C: 43.1 ml LV vol s, MOD A4C: 63.9 ml LV SV MOD A2C:     59.9 ml LV SV MOD A4C:     120.0 ml LV SV MOD BP:      63.5 ml RIGHT VENTRICLE             IVC RV S prime:     11.60 cm/s  IVC diam: 2.86 cm TAPSE (M-mode): 1.8 cm LEFT ATRIUM             Index       RIGHT ATRIUM           Index LA diam:        4.80 cm 2.10 cm/m  RA Area:     25.80 cm LA Vol (A2C):   72.3 ml 31.62 ml/m RA Volume:   86.30 ml  37.74 ml/m LA Vol (A4C):   90.3 ml 39.49 ml/m LA Biplane Vol: 81.9 ml 35.81 ml/m  AORTIC VALVE LVOT Vmax:   132.00 cm/s LVOT Vmean:  93.300 cm/s LVOT VTI:    0.186 m  AORTA Ao Root diam: 3.30 cm Ao Asc diam:  3.70 cm MITRAL VALVE                TRICUSPID VALVE MV Area (PHT): 3.12 cm     TR Peak grad:   48.2 mmHg MV Decel Time: 243 msec     TR Vmax:        347.00 cm/s MV E velocity: 130.00 cm/s  MV A velocity: 37.20 cm/s   SHUNTS MV E/A ratio:  3.49         Systemic VTI:  0.19 m                             Systemic Diam: 2.00 cm Zoila Shutter MD Electronically signed by Zoila Shutter MD Signature Date/Time: 11/10/2019/4:24:47 PM    Final         Scheduled Meds: . amLODipine  10 mg Oral Daily  . enoxaparin (LOVENOX) injection  40 mg Subcutaneous Q24H  . hydrALAZINE  25 mg Oral TID  . sodium chloride flush  3 mL Intravenous Q12H   Continuous Infusions:   LOS: 2 days   Time spent= 35 mins    Shyra Emile Joline Maxcy, MD [facility]  If 7PM-7AM, please contact night-coverage  11/11/2019, 7:55 AM

## 2019-11-11 NOTE — Progress Notes (Signed)
Pt continued to have elevated blood pressures throughout the night. PRN hydralazine given this AM - effective. Pt complained of mild headache and dizziness, education was provided. Monitoring.

## 2019-11-11 NOTE — Progress Notes (Signed)
Bilateral lower extremity venous duplex completed. Refer to "CV Proc" under chart review to view preliminary results.  11/11/2019 3:54 PM Eula Fried., MHA, RVT, RDCS, RDMS

## 2019-11-12 ENCOUNTER — Inpatient Hospital Stay (HOSPITAL_COMMUNITY): Payer: Self-pay

## 2019-11-12 DIAGNOSIS — I161 Hypertensive emergency: Secondary | ICD-10-CM

## 2019-11-12 LAB — BASIC METABOLIC PANEL
Anion gap: 12 (ref 5–15)
BUN: 7 mg/dL (ref 6–20)
CO2: 23 mmol/L (ref 22–32)
Calcium: 8.3 mg/dL — ABNORMAL LOW (ref 8.9–10.3)
Chloride: 103 mmol/L (ref 98–111)
Creatinine, Ser: 0.87 mg/dL (ref 0.44–1.00)
GFR calc Af Amer: >60 mL/min (ref >60)
GFR calc non Af Amer: >60 mL/min (ref >60)
Glucose, Bld: 103 mg/dL — ABNORMAL HIGH (ref 70–99)
Potassium: 2.9 mmol/L — ABNORMAL LOW (ref 3.5–5.1)
Sodium: 138 mmol/L (ref 135–145)

## 2019-11-12 LAB — CBC
HCT: 41 % (ref 36.0–46.0)
Hemoglobin: 13.3 g/dL (ref 12.0–15.0)
MCH: 31.9 pg (ref 26.0–34.0)
MCHC: 32.4 g/dL (ref 30.0–36.0)
MCV: 98.3 fL (ref 80.0–100.0)
Platelets: 233 10*3/uL (ref 150–400)
RBC: 4.17 MIL/uL (ref 3.87–5.11)
RDW: 15 % (ref 11.5–15.5)
WBC: 14 10*3/uL — ABNORMAL HIGH (ref 4.0–10.5)
nRBC: 0 % (ref 0.0–0.2)

## 2019-11-12 LAB — MAGNESIUM: Magnesium: 1.9 mg/dL (ref 1.7–2.4)

## 2019-11-12 MED ORDER — METOLAZONE 5 MG PO TABS
5.0000 mg | ORAL_TABLET | Freq: Once | ORAL | Status: AC
  Administered 2019-11-12: 5 mg via ORAL
  Filled 2019-11-12: qty 1

## 2019-11-12 MED ORDER — HYDRALAZINE HCL 50 MG PO TABS
75.0000 mg | ORAL_TABLET | Freq: Three times a day (TID) | ORAL | Status: DC
  Administered 2019-11-12 – 2019-11-13 (×4): 75 mg via ORAL
  Filled 2019-11-12 (×4): qty 1

## 2019-11-12 MED ORDER — POTASSIUM CHLORIDE CRYS ER 20 MEQ PO TBCR
40.0000 meq | EXTENDED_RELEASE_TABLET | ORAL | Status: AC
  Administered 2019-11-12 (×2): 40 meq via ORAL
  Filled 2019-11-12 (×2): qty 2

## 2019-11-12 MED ORDER — CLONIDINE HCL 0.2 MG PO TABS
0.2000 mg | ORAL_TABLET | Freq: Two times a day (BID) | ORAL | Status: DC
  Administered 2019-11-12 – 2019-11-13 (×3): 0.2 mg via ORAL
  Filled 2019-11-12 (×2): qty 1

## 2019-11-12 MED ORDER — POTASSIUM CHLORIDE 10 MEQ/100ML IV SOLN
10.0000 meq | INTRAVENOUS | Status: AC
  Administered 2019-11-12 (×4): 10 meq via INTRAVENOUS
  Filled 2019-11-12 (×4): qty 100

## 2019-11-12 MED ORDER — NITROGLYCERIN IN D5W 200-5 MCG/ML-% IV SOLN
0.0000 ug/min | INTRAVENOUS | Status: DC
  Administered 2019-11-12: 5 ug/min via INTRAVENOUS
  Administered 2019-11-13: 50 ug/min via INTRAVENOUS
  Filled 2019-11-12 (×2): qty 250

## 2019-11-12 NOTE — Progress Notes (Signed)
Patient current vitals are as follows:   11/12/19 1055  Vitals  BP (!) 213/117 (RN [employer] notified. PRN hydralizine given per order)  MAP (mmHg) 144  BP Location Left Arm  BP Method Automatic  Patient Position (if appropriate) Sitting  Pulse Rate 77  Resp 18  Level of Consciousness  Level of Consciousness Alert  Oxygen Therapy  SpO2 100 %  O2 Device Room Air   PRN IV hydralazine given. Patient continues to complain of constant headache, is alert and oriented X4, denies any chest pain, troubles breathing, or vision changes.  Nelson Chimes, MD paged regarding blood pressure. Verbal orders received to give PO hydralazine now. RN will carry out orders.

## 2019-11-12 NOTE — Progress Notes (Addendum)
Per CCMD, patient had 13 beat run of SVT. Current vitals are as follows:   11/12/19 1402  Vitals  BP (!) 189/100  MAP (mmHg) 125  BP Method Automatic  Pulse Rate 73  Pulse Rate Source Monitor  Oxygen Therapy  SpO2 98 %  O2 Device Room Air  MEWS Score  MEWS Temp 0  MEWS Systolic 0  MEWS Pulse 0  MEWS RR 0  MEWS LOC 0  MEWS Score 0  MEWS Score Color Green   Patient denies any chest pain or dyspnea. Curerntly, patient is lying in bed, television on. Nelson Chimes, MD paged.

## 2019-11-12 NOTE — Progress Notes (Addendum)
Patient is alert and oriented X4. complains of worsening headache, dizziness, and nausea. Denies chest pain or troubles breathing. Current vitals are as follows:    11/12/19 1202  Vitals  BP (!) 211/113  MAP (mmHg) 141  BP Method Automatic  Pulse Rate 81  Pulse Rate Source Monitor  Oxygen Therapy  SpO2 100 %  MEWS Score  MEWS Temp 0  MEWS Systolic 2  MEWS Pulse 0  MEWS RR 0  MEWS LOC 0  MEWS Score 2  MEWS Score Color Yellow   RN will give PRN tylenol after 1220. Amin, MD paged regarding blood pressure and change in status.   Amin, MD returned Rn's paged and states he will put in orders.

## 2019-11-12 NOTE — Progress Notes (Signed)
Difficult to control her blood pressure, added clonidine and nitroglycerin drip.  Goal blood pressure systolics around 160.

## 2019-11-12 NOTE — Progress Notes (Signed)
PROGRESS NOTE    Robin Jarvis  NGE:[id number] DOB: 25-Feb-1976 DOA: 11/09/2019 PCP: System, Provider Not In   Brief Narrative:  44 year old with history of essential hypertension, herpes simplex came to the hospital with complaints of dyspnea on exertion and chest tightness.  She was noted to have elevated blood pressure reporting of headaches.  Upon admission she was in hypertensive emergency, hypokalemia, elevated BNP.  Chest x-ray was suggestive of cardiomegaly.  Elevated D-dimer, CTA was negative for PE but showed cardiomegaly with small pleural effusion and concerns for pulmonary edema.   Assessment & Plan:   Principal Problem:   Hypertensive emergency Active Problems:   Dyspnea   Chest pain   Weight gain   Cardiomegaly   Elevated brain natriuretic peptide (BNP) level   Hypokalemia  Hypertensive emergency, uncontrolled History of essential hypertension -Slowly normalize blood pressure over the course of next several days -Now on Norvasc 10 mg daily, increase hydralazine to 75 mg 3 times daily -IV hydralazine as needed -Previously not taking antihypertensives-medical noncompliance -Renal arterial Dopplers done, results pending.  Dyspnea on exertion with cardiomegaly Chronic diastolic congestive heart failure with preserved ejection fraction 55% Elevated BNP -Echocardiogram showed EF of 40%, grade 1 diastolic dysfunction, severe LVH, moderate hypokinesis of the left ventricular wall.  Elevated pulmonary arterial systolic pressure. -Maintain fluid restriction.  Responding well to diuretics, continue Lasix 40 mg IV twice daily.  Will give an additional dose of Zaroxolyn 5 mg today. -Aggressively replete electrolytes as necessary -LDL 120, A1c 5.4, TSH normal, procalcitonin-negative  Hypokalemia -Secondary to total body depletion and being on Lasix.  Aggressively replete this.  DVT prophylaxis: Lovenox Code Status: Full code Family Communication: None Disposition Plan:    Patient From= home  Patient Anticipated D/C place= Home  Barriers= maintain hospital stay for aggressive IV diuretics, quite elevated blood pressure.  She needs better control and slow correction  Subjective: Still having intermittent headaches, blood pressure still remains quite elevated with systolic between 102-725. Shortness of breath has improved  Review of Systems Otherwise negative except as per HPI, including: General = no fevers, chills, dizziness,  fatigue HEENT/EYES = negative for loss of vision, double vision, blurred vision,  sore throa Cardiovascular= negative for chest pain, palpitation Respiratory/lungs= negative for shortness of breath, cough, wheezing; hemoptysis,  Gastrointestinal= negative for nausea, vomiting, abdominal pain Genitourinary= negative for Dysuria MSK = Negative for arthralgia, myalgias Neurology= Negative fornumbness, tingling  Psychiatry= Negative for suicidal and homocidal ideation Skin= Negative for Rash   Examination:  Constitutional: Not in acute distress Respiratory: Some bibasilar rhonchi Cardiovascular: Normal sinus rhythm, no rubs Abdomen: Nontender nondistended good bowel sounds Musculoskeletal: No edema noted Skin: No rashes seen Neurologic: CN 2-12 grossly intact.  And nonfocal Psychiatric: Normal judgment and insight. Alert and oriented x 3. Normal mood.     Objective: Vitals:   11/12/19 0624 11/12/19 0804 11/12/19 0834 11/12/19 1055  BP: (!) 192/114 (!) 198/115 (!) 194/114 (!) 213/117  Pulse:  85 82 77  Resp:  15 20 18   Temp:  98.4 F (36.9 C) 98 F (36.7 C)   TempSrc:  Oral    SpO2:  99% 99% 100%  Weight:      Height:        Intake/Output Summary (Last 24 hours) at 11/12/2019 1113 Last data filed at 11/12/2019 1020 Gross per 24 hour  Intake 820 ml  Output 2500 ml  Net -1680 ml   Filed Weights   11/10/19 1001 11/11/19 0509 11/12/19 0400  Weight:  115.2 kg 113.9 kg 110 kg     Data Reviewed:   CBC: Recent  Labs  Lab 11/09/19 2020 11/10/19 1314 11/12/19 0435  WBC 10.8* 15.0* 14.0*  HGB 13.2 12.9 13.3  HCT 37.9 38.1 41.0  MCV 92.9 93.4 98.3  PLT 230 248 233   Basic Metabolic Panel: Recent Labs  Lab 11/09/19 2020 11/10/19 1314 11/12/19 0435  NA 139 139 138  K 2.6* 3.0* 2.9*  CL 100 102 103  CO2 GLUCOSE 100* 109* 103*  BUN CREATININE 0.96 1.06* 0.87  CALCIUM 8.9 8.3* 8.3*  MG  --  1.7 1.9   GFR: Estimated Creatinine Clearance: 112.4 mL/min (by C-G formula based on SCr of 0.87 mg/dL). Liver Function Tests: Recent Labs  Lab 11/09/19 2020  AST 34  ALT 32  ALKPHOS 51  BILITOT 1.4*  PROT 6.4*  ALBUMIN 3.2*   No results for input(s): LIPASE, AMYLASE in the last 168 hours. No results for input(s): AMMONIA in the last 168 hours. Coagulation Profile: No results for input(s): INR, PROTIME in the last 168 hours. Cardiac Enzymes: No results for input(s): CKTOTAL, CKMB, CKMBINDEX, TROPONINI in the last 168 hours. BNP (last 3 results) No results for input(s): PROBNP in the last 8760 hours. HbA1C: Recent Labs    11/11/19 0808  HGBA1C 5.4   CBG: No results for input(s): GLUCAP in the last 168 hours. Lipid Profile: Recent Labs    11/11/19 0808  CHOL 175  HDL 36*  LDLCALC 120*  TRIG 93  CHOLHDL 4.9   Thyroid Function Tests: Recent Labs    11/11/19 0808  TSH 2.975   Anemia Panel: No results for input(s): VITAMINB12, FOLATE, FERRITIN, TIBC, IRON, RETICCTPCT in the last 72 hours. Sepsis Labs: Recent Labs  Lab 11/11/19 0808  PROCALCITON <0.10    Recent Results (from the past 240 hour(s))  Respiratory Panel by RT PCR (Flu A&B, Covid) - Nasopharyngeal Swab     Status: None   Collection Time: 11/09/19 10:25 PM   Specimen: Nasopharyngeal Swab  Result Value Ref Range Status   SARS Coronavirus 2 by RT PCR NEGATIVE NEGATIVE Final    Comment: (NOTE) SARS-CoV-2 target nucleic acids are NOT DETECTED. The SARS-CoV-2 RNA is generally detectable in  upper respiratoy specimens during the acute phase of infection. The lowest concentration of SARS-CoV-2 viral copies this assay can detect is 131 copies/mL. A negative result does not preclude SARS-Cov-2 infection and should not be used as the sole basis for treatment or other patient management decisions. A negative result may occur with  improper specimen collection/handling, submission of specimen other than nasopharyngeal swab, presence of viral mutation(s) within the areas targeted by this assay, and inadequate number of viral copies (<131 copies/mL). A negative result must be combined with clinical observations, patient history, and epidemiological information. The expected result is Negative. Fact Sheet for Patients:  https://www.moore.com/ Fact Sheet for Healthcare Providers:  https://www.young.biz/ This test is not yet ap proved or cleared by the [place] FDA and  has been authorized for detection and/or diagnosis of SARS-CoV-2 by FDA under an Emergency Use Authorization (EUA). This EUA will remain  in effect (meaning this test can be used) for the duration of the COVID-19 declaration under Section 564(b)(1) of the Act, 21 U.S.C. section 360bbb-3(b)(1), unless the authorization is terminated or revoked sooner.    Influenza A by PCR NEGATIVE NEGATIVE Final   Influenza B by PCR NEGATIVE NEGATIVE Final  Comment: (NOTE) The Xpert Xpress SARS-CoV-2/FLU/RSV assay is intended as an aid in  the diagnosis of influenza from Nasopharyngeal swab specimens and  should not be used as a sole basis for treatment. Nasal washings and  aspirates are unacceptable for Xpert Xpress SARS-CoV-2/FLU/RSV  testing. Fact Sheet for Patients: https://www.moore.com/ Fact Sheet for Healthcare Providers: https://www.young.biz/ This test is not yet approved or cleared by the [place] FDA and  has been authorized for  detection and/or diagnosis of SARS-CoV-2 by  FDA under an Emergency Use Authorization (EUA). This EUA will remain  in effect (meaning this test can be used) for the duration of the  Covid-19 declaration under Section 564(b)(1) of the Act, 21  U.S.C. section 360bbb-3(b)(1), unless the authorization is  terminated or revoked. Performed at [facility], [address], [place], [place] [address]          Radiology Studies: CT ANGIO CHEST PE W OR WO CONTRAST  Result Date: 11/10/2019 CLINICAL DATA:  Shortness of breath. Chest pain for 3 days. Positive D-dimer. EXAM: CT ANGIOGRAPHY CHEST WITH CONTRAST TECHNIQUE: Multidetector CT imaging of the chest was performed using the standard protocol during bolus administration of intravenous contrast. Multiplanar CT image reconstructions and MIPs were obtained to evaluate the vascular anatomy. CONTRAST:  80mL OMNIPAQUE IOHEXOL 350 MG/ML SOLN COMPARISON:  Chest radiograph of 1 day prior. No prior CT. FINDINGS: Cardiovascular: The quality of this exam for evaluation of pulmonary embolism is moderate. The bolus is moderately well timed, with a large amount of contrast remaining in the SVC. Degradation secondary to patient size. To the upper lungs, no pulmonary embolism to the large segmental level. No subsegmental embolism to the lower lungs. Pulmonary artery enlargement, outflow tract 3.8 cm. The aorta is grossly normal in caliber, but not well opacified. Moderate cardiomegaly, with right sided chamber enlargement. Contrast reflux in the IVC and hepatic veins. Mediastinum/Nodes: No well-defined mediastinal adenopathy. Suspect mediastinal edema, possibly related to fluid overload. No hilar adenopathy. Lungs/Pleura: Small left and trace right pleural fluid. Mosaic attenuation, favored to be related to air trapping and hypoventilation. Upper Abdomen: Normal imaged portions of the liver, spleen, stomach, pancreas, adrenal glands, kidneys. Musculoskeletal:  Moderate thoracic spondylosis. Review of the MIP images confirms the above findings. IMPRESSION: 1. Moderate quality exam for evaluation of pulmonary embolism. No evidence of pulmonary embolism, with limitations above. 2. Pulmonary artery enlargement suggests pulmonary arterial hypertension. Cardiomegaly with elevated right heart pressures. 3. Small left and trace right pleural fluid. Question fluid overload. Electronically Signed   By: Jeronimo Greaves M.D.   On: 11/10/2019 18:21   ECHOCARDIOGRAM COMPLETE  Result Date: 11/10/2019    ECHOCARDIOGRAM REPORT   Patient Name:   DYNESHIA BACCAM Date of Exam: 11/10/2019 Medical Rec #:  [id number]   Height:       69.0 in Accession #:    [id number]  Weight:       254.0 lb Date of Birth:  09-Sep-1975   BSA:          2.287 m Patient Age:    43 years    BP:           193/96 mmHg Patient Gender: F           HR:           71 bpm. Exam Location:  Inpatient Procedure: 2D Echo, Cardiac Doppler and Color Doppler Indications:    R07.9* Chest pain, unspecified  History:  Patient has prior history of Echocardiogram examinations, most                 recent 05/04/2008. Cardiomegaly, Signs/Symptoms:Dyspnea; Risk                 Factors:Hypertension.  Sonographer:    Sheralyn Boatman RDCS Referring Phys: [id number] RONDELL A SMITH IMPRESSIONS  1. Left ventricular ejection fraction, by estimation, is 50 to 55%. The left ventricle has low normal function. The left ventricle has no regional wall motion abnormalities. There is severe left ventricular hypertrophy. Left ventricular diastolic parameters are consistent with Grade I diastolic dysfunction (impaired relaxation). Elevated left ventricular end-diastolic pressure. There is moderate hypokinesis of the left ventricular, entire septal wall.  2. Right ventricular systolic function is normal. The right ventricular size is normal. There is moderately elevated pulmonary artery systolic pressure.  3. Left atrial size was mildly dilated.  4. Right atrial  size was mildly dilated.  5. The mitral valve is grossly normal. Trivial mitral valve regurgitation.  6. Tricuspid valve regurgitation is moderate.  7. The aortic valve was not well visualized. Aortic valve regurgitation is not visualized.  8. The inferior vena cava is dilated in size with <50% respiratory variability, suggesting right atrial pressure of 15 mmHg.  9. Trivial to small circumferential pericardial effusion. Tamponade is not suspected.S FINDINGS  Left Ventricle: Left ventricular ejection fraction, by estimation, is 50 to 55%. The left ventricle has low normal function. The left ventricle has no regional wall motion abnormalities. Moderate hypokinesis of the left ventricular, entire septal wall. The left ventricular internal cavity size was normal in size. There is severe left ventricular hypertrophy. Left ventricular diastolic parameters are consistent with Grade I diastolic dysfunction (impaired relaxation). Elevated left ventricular end-diastolic pressure. Right Ventricle: The right ventricular size is normal. No increase in right ventricular wall thickness. Right ventricular systolic function is normal. There is moderately elevated pulmonary artery systolic pressure. The tricuspid regurgitant velocity is 3.47 m/s, and with an assumed right atrial pressure of 15 mmHg, the estimated right ventricular systolic pressure is 63.2 mmHg. Left Atrium: Left atrial size was mildly dilated. Right Atrium: Right atrial size was mildly dilated. Pericardium: A small pericardial effusion is present. The pericardial effusion is circumferential. There is no evidence of cardiac tamponade. Mitral Valve: The mitral valve is grossly normal. Trivial mitral valve regurgitation. Tricuspid Valve: The tricuspid valve is grossly normal. Tricuspid valve regurgitation is moderate. Aortic Valve: The aortic valve was not well visualized. Aortic valve regurgitation is not visualized. Pulmonic Valve: The pulmonic valve was grossly  normal. Pulmonic valve regurgitation is trivial. Aorta: The aortic root and ascending aorta are structurally normal, with no evidence of dilitation. Venous: The inferior vena cava is dilated in size with less than 50% respiratory variability, suggesting right atrial pressure of 15 mmHg. IAS/Shunts: No atrial level shunt detected by color flow Doppler.  LEFT VENTRICLE PLAX 2D LVIDd:         4.60 cm      Diastology LVIDs:         3.35 cm      LV e' lateral:   5.56 cm/s LV PW:         1.82 cm      LV E/e' lateral: 23.4 LV IVS:        1.79 cm      LV e' medial:    7.40 cm/s LVOT diam:     2.00 cm      LV E/e' medial:  17.6 LV SV:         58 LV SV Index:   26 LVOT Area:     3.14 cm  LV Volumes (MOD) LV vol d, MOD A2C: 103.0 ml LV vol d, MOD A4C: 120.0 ml LV vol s, MOD A2C: 43.1 ml LV vol s, MOD A4C: 63.9 ml LV SV MOD A2C:     59.9 ml LV SV MOD A4C:     120.0 ml LV SV MOD BP:      63.5 ml RIGHT VENTRICLE             IVC RV S prime:     11.60 cm/s  IVC diam: 2.86 cm TAPSE (M-mode): 1.8 cm LEFT ATRIUM             Index       RIGHT ATRIUM           Index LA diam:        4.80 cm 2.10 cm/m  RA Area:     25.80 cm LA Vol (A2C):   72.3 ml 31.62 ml/m RA Volume:   86.30 ml  37.74 ml/m LA Vol (A4C):   90.3 ml 39.49 ml/m LA Biplane Vol: 81.9 ml 35.81 ml/m  AORTIC VALVE LVOT Vmax:   132.00 cm/s LVOT Vmean:  93.300 cm/s LVOT VTI:    0.186 m  AORTA Ao Root diam: 3.30 cm Ao Asc diam:  3.70 cm MITRAL VALVE                TRICUSPID VALVE MV Area (PHT): 3.12 cm     TR Peak grad:   48.2 mmHg MV Decel Time: 243 msec     TR Vmax:        347.00 cm/s MV E velocity: 130.00 cm/s MV A velocity: 37.20 cm/s   SHUNTS MV E/A ratio:  3.49         Systemic VTI:  0.19 m                             Systemic Diam: 2.00 cm Zoila Shutter MD Electronically signed by Zoila Shutter MD Signature Date/Time: 11/10/2019/4:24:47 PM    Final    VAS [place] LOWER EXTREMITY VENOUS (DVT)  Result Date: 11/11/2019  Lower Venous DVTStudy Indications: Edema.   Comparison Study: No prior study Performing Technologist: Gertie Fey MHA, RDMS, RVT, RDCS  Examination Guidelines: A complete evaluation includes B-mode imaging, spectral Doppler, color Doppler, and power Doppler as needed of all accessible portions of each vessel. Bilateral testing is considered an integral part of a complete examination. Limited examinations for reoccurring indications may be performed as noted. The reflux portion of the exam is performed with the patient in reverse Trendelenburg.  +---------+---------------+---------+-----------+----------+--------------+ RIGHT    CompressibilityPhasicitySpontaneityPropertiesThrombus Aging +---------+---------------+---------+-----------+----------+--------------+ CFV      Full           No       Yes                                 +---------+---------------+---------+-----------+----------+--------------+ SFJ      Full                                                        +---------+---------------+---------+-----------+----------+--------------+  FV Prox  Full                                                        +---------+---------------+---------+-----------+----------+--------------+ FV Mid   Full                                                        +---------+---------------+---------+-----------+----------+--------------+ FV DistalFull                                                        +---------+---------------+---------+-----------+----------+--------------+ PFV      Full                                                        +---------+---------------+---------+-----------+----------+--------------+ POP      Full           No       Yes                                 +---------+---------------+---------+-----------+----------+--------------+ PTV      Full                                                         +---------+---------------+---------+-----------+----------+--------------+ PERO     Full                                                        +---------+---------------+---------+-----------+----------+--------------+   +---------+---------------+---------+-----------+----------+--------------+ LEFT     CompressibilityPhasicitySpontaneityPropertiesThrombus Aging +---------+---------------+---------+-----------+----------+--------------+ CFV      Full           No       Yes                                 +---------+---------------+---------+-----------+----------+--------------+ SFJ      Full                                                        +---------+---------------+---------+-----------+----------+--------------+ FV Prox  Full                                                        +---------+---------------+---------+-----------+----------+--------------+  FV Mid   Full                                                        +---------+---------------+---------+-----------+----------+--------------+ FV DistalFull                                                        +---------+---------------+---------+-----------+----------+--------------+ PFV      Full                                                        +---------+---------------+---------+-----------+----------+--------------+ POP      Full           No       Yes                                 +---------+---------------+---------+-----------+----------+--------------+ PTV      Full                    Yes                                 +---------+---------------+---------+-----------+----------+--------------+ PERO     Full                    Yes                                 +---------+---------------+---------+-----------+----------+--------------+     Summary: RIGHT: - There is no evidence of deep vein thrombosis in the lower extremity.  - No cystic structure found in  the popliteal fossa.  LEFT: - There is no evidence of deep vein thrombosis in the lower extremity.  - No cystic structure found in the popliteal fossa.  Pulsatile lower extremity venous flow is suggestive of possible elevated right heart pressure.  *See table(s) above for measurements and observations. Electronically signed by Gretta Began MD on 11/11/2019 at 6:40:04 PM.    Final    VAS US RENAL ARTERY DUPLEX  Result Date: 11/12/2019 ABDOMINAL VISCERAL Indications: HTN High Risk Factors: Hypertension. Limitations: Air/bowel gas and obesity. Comparison Study: No prior exam Performing Technologist: Kennedy Bucker RDMS, RVT  Examination Guidelines: A complete evaluation includes B-mode imaging, spectral Doppler, color Doppler, and power Doppler as needed of all accessible portions of each vessel. Bilateral testing is considered an integral part of a complete examination. Limited examinations for reoccurring indications may be performed as noted.  Duplex Findings: +--------------------+--------+--------+------+--------+ Mesenteric          PSV cm/sEDV cm/sPlaqueComments +--------------------+--------+--------+------+--------+ Aorta Prox            119                          +--------------------+--------+--------+------+--------+ Celiac Artery Origin  131                          +--------------------+--------+--------+------+--------+  SMA Proximal          200                          +--------------------+--------+--------+------+--------+    +------------------+--------+--------+--------------+ Right Renal ArteryPSV cm/sEDV cm/s   Comment     +------------------+--------+--------+--------------+ Origin                            Not visualized +------------------+--------+--------+--------------+ Proximal            166      31                  +------------------+--------+--------+--------------+ Mid                  57      15                   +------------------+--------+--------+--------------+ Distal               90      22                  +------------------+--------+--------+--------------+ +-----------------+--------+--------+--------------+ Left Renal ArteryPSV cm/sEDV cm/s   Comment     +-----------------+--------+--------+--------------+ Origin                           Not visualized +-----------------+--------+--------+--------------+ Proximal                         Not visualized +-----------------+--------+--------+--------------+ Mid                 91      28                  +-----------------+--------+--------+--------------+ Distal              44      12                  +-----------------+--------+--------+--------------+ +------------+--------+--------+----+-----------+--------+--------+----+ Right KidneyPSV cm/sEDV cm/sRI  Left KidneyPSV cm/sEDV cm/sRI   +------------+--------+--------+----+-----------+--------+--------+----+ Upper Pole  26      7       0.73Upper Pole 43      14      0.67 +------------+--------+--------+----+-----------+--------+--------+----+ Mid         36      13      0.        29      12      0.60 +------------+--------+--------+----+-----------+--------+--------+----+ Lower Pole  24      8       0.67Lower Pole 39      14      0.65 +------------+--------+--------+----+-----------+--------+--------+----+ Hilar       88      25      0.72Hilar      36      14      0.62 +------------+--------+--------+----+-----------+--------+--------+----+ +------------------+-----+------------------+-----+ Right Kidney           Left Kidney             +------------------+-----+------------------+-----+ RAR                    RAR                     +------------------+-----+------------------+-----+ RAR (manual)      1.39 RAR (manual)  0.76  +------------------+-----+------------------+-----+ Cortex                 Cortex                   +------------------+-----+------------------+-----+ Cortex thickness       Corex thickness         +------------------+-----+------------------+-----+ Kidney length (cm)11.50Kidney length (cm)11.20 +------------------+-----+------------------+-----+  Summary: Renal:  Right: Normal size right kidney. Abnormal right Resistive Index. No        evidence of right renal artery stenosis. RRV flow present. Left:  Normal size of left kidney. Normal left Resistive Index. No        evidence of left renal artery stenosis. LRV flow present. Mesenteric: Normal Celiac artery and Superior Mesenteric artery findings. Areas of limited visceral study include right renal artery and left renal artery.  *See table(s) above for measurements and observations.     Preliminary         Scheduled Meds: . amLODipine  10 mg Oral Daily  . enoxaparin (LOVENOX) injection  40 mg Subcutaneous Q24H  . furosemide  40 mg Intravenous BID  . hydrALAZINE  75 mg Oral TID  . potassium chloride  40 mEq Oral Q4H  . sodium chloride flush  3 mL Intravenous Q12H   Continuous Infusions: . potassium chloride 10 mEq (11/12/19 1049)     LOS: 3 days   Time spent= 35 mins    Simrit Gohlke Joline Maxcy, MD [facility]  If 7PM-7AM, please contact night-coverage  11/12/2019, 11:13 AM

## 2019-11-12 NOTE — Progress Notes (Signed)
Bilateral renal artery duplex exam completed.  Preliminary results can be found under CV proc under chart review.  11/12/2019 10:44 AM  Nola Botkins, K., RDMS, RVT

## 2019-11-12 NOTE — Progress Notes (Signed)
Patient's current blood pressure is 165/87, MAP 111, heart rate is 82. Nitroglycerin drip currently running at 10.5 mL/hr. Will continue nitroglycerin drip as ordered. Patient complains of slight headache, denies dizziness or nausea. Will bring PRN tylenol after 1841.

## 2019-11-12 NOTE — Progress Notes (Addendum)
Nelson Chimes, MD currently at bedside. Nitroglycerin drip is currently running at 15 mcg/min and blood pressure is 187/94, heart rate is 78.  Verbal orders received to continue titration of nitroglycerin until systolic blood pressure is 160, then stop nitroglycerin drip if systolic blood pressure goes below 160.Marland Kitchen RN will carry out orders.

## 2019-11-13 LAB — BASIC METABOLIC PANEL
Anion gap: 14 (ref 5–15)
Anion gap: 10 (ref 5–15)
BUN: 8 mg/dL (ref 6–20)
BUN: 11 mg/dL (ref 6–20)
CO2: 28 mmol/L (ref 22–32)
CO2: 29 mmol/L (ref 22–32)
Calcium: 9 mg/dL (ref 8.9–10.3)
Calcium: 8.9 mg/dL (ref 8.9–10.3)
Chloride: 91 mmol/L — ABNORMAL LOW (ref 98–111)
Chloride: 94 mmol/L — ABNORMAL LOW (ref 98–111)
Creatinine, Ser: 1.19 mg/dL — ABNORMAL HIGH (ref 0.44–1.00)
Creatinine, Ser: 1.24 mg/dL — ABNORMAL HIGH (ref 0.44–1.00)
GFR calc Af Amer: >60 mL/min (ref >60)
GFR calc Af Amer: >60 mL/min (ref >60)
GFR calc non Af Amer: 57 mL/min — ABNORMAL LOW (ref >60)
GFR calc non Af Amer: 54 mL/min — ABNORMAL LOW (ref >60)
Glucose, Bld: 109 mg/dL — ABNORMAL HIGH (ref 70–99)
Glucose, Bld: 105 mg/dL — ABNORMAL HIGH (ref 70–99)
Potassium: 2.3 mmol/L — CL (ref 3.5–5.1)
Potassium: 3 mmol/L — ABNORMAL LOW (ref 3.5–5.1)
Sodium: 133 mmol/L — ABNORMAL LOW (ref 135–145)
Sodium: 133 mmol/L — ABNORMAL LOW (ref 135–145)

## 2019-11-13 LAB — CBC
HCT: 41.5 % (ref 36.0–46.0)
Hemoglobin: 14.3 g/dL (ref 12.0–15.0)
MCH: 31.9 pg (ref 26.0–34.0)
MCHC: 34.5 g/dL (ref 30.0–36.0)
MCV: 92.6 fL (ref 80.0–100.0)
Platelets: 298 10*3/uL (ref 150–400)
RBC: 4.48 MIL/uL (ref 3.87–5.11)
RDW: 13.9 % (ref 11.5–15.5)
WBC: 17.9 10*3/uL — ABNORMAL HIGH (ref 4.0–10.5)
nRBC: 0 % (ref 0.0–0.2)

## 2019-11-13 LAB — MAGNESIUM: Magnesium: 1.9 mg/dL (ref 1.7–2.4)

## 2019-11-13 MED ORDER — POTASSIUM CHLORIDE CRYS ER 20 MEQ PO TBCR
40.0000 meq | EXTENDED_RELEASE_TABLET | ORAL | Status: AC
  Administered 2019-11-13 (×4): 40 meq via ORAL
  Filled 2019-11-13 (×4): qty 2

## 2019-11-13 MED ORDER — HYDRALAZINE HCL 50 MG PO TABS
100.0000 mg | ORAL_TABLET | Freq: Three times a day (TID) | ORAL | Status: DC
  Administered 2019-11-13 – 2019-11-14 (×4): 100 mg via ORAL
  Filled 2019-11-13 (×4): qty 2

## 2019-11-13 MED ORDER — CLONIDINE HCL 0.2 MG PO TABS
0.2000 mg | ORAL_TABLET | Freq: Three times a day (TID) | ORAL | Status: DC
  Administered 2019-11-13 – 2019-11-14 (×4): 0.2 mg via ORAL
  Filled 2019-11-13 (×4): qty 1

## 2019-11-13 MED ORDER — SPIRONOLACTONE 25 MG PO TABS
25.0000 mg | ORAL_TABLET | Freq: Once | ORAL | Status: AC
  Administered 2019-11-13: 25 mg via ORAL
  Filled 2019-11-13: qty 1

## 2019-11-13 MED ORDER — POTASSIUM CHLORIDE 10 MEQ/100ML IV SOLN
10.0000 meq | INTRAVENOUS | Status: AC
  Administered 2019-11-13 (×4): 10 meq via INTRAVENOUS
  Filled 2019-11-13 (×4): qty 100

## 2019-11-13 NOTE — Progress Notes (Signed)
CRITICAL VALUE ALERT  Critical Value:  K- level 2.3  Date & Time Notied: 11/13/2019 at 0618  Provider Notified: Provider on call paged, NP Bodenheimer via AMION  Orders Received/Actions taken: Waiting for response

## 2019-11-13 NOTE — Progress Notes (Signed)
PROGRESS NOTE    Robin Jarvis  ZOX:[id number] DOB: 04/28/1976 DOA: 11/09/2019 PCP: System, Provider Not In   Brief Narrative:  44 year old with history of essential hypertension, herpes simplex came to the hospital with complaints of dyspnea on exertion and chest tightness.  She was noted to have elevated blood pressure reporting of headaches.  Upon admission she was in hypertensive emergency, hypokalemia, elevated BNP.  Chest x-ray was suggestive of cardiomegaly.  Elevated D-dimer, CTA was negative for PE but showed cardiomegaly with small pleural effusion and concerns for pulmonary edema.   Assessment & Plan:   Principal Problem:   Hypertensive emergency Active Problems:   Dyspnea   Chest pain   Weight gain   Cardiomegaly   Elevated brain natriuretic peptide (BNP) level   Hypokalemia  Hypertensive emergency, uncontrolled History of essential hypertension -Still uncontrolled on nitroglycerin drip. -Continue Norvasc 10 mg daily, hydralazine increased to 100 mg 3 times daily, clonidine 0.2 mg 3 times daily. -1 dose Aldactone 25 mg -IV hydralazine as needed -Previously not taking antihypertensives-medical noncompliance -Renal arterial Dopplers -negative -Aldosterone/renin levels ordered for tomorrow morning -Urine metanephrines -CT of the chest-were able to see adrenals which appeared normal.  Severe hypokalemia -Aggressive repletion.  Repeat labs again later today  Dyspnea on exertion with cardiomegaly Chronic diastolic congestive heart failure with preserved ejection fraction 55% Elevated BNP -Echocardiogram showed EF of 55%, grade 1 diastolic dysfunction, severe LVH, moderate hypokinesis of the left ventricular wall.  Elevated pulmonary arterial systolic pressure. -Maintain fluid restriction.  Continue Lasix 40 mg IV twice daily. -Aggressively replete electrolytes as necessary -LDL 120, A1c 5.4, TSH normal, procalcitonin-negative   DVT prophylaxis: Lovenox Code Status:  Full code Family Communication: None Disposition Plan:   Patient From= home  Patient Anticipated D/C place= Home  Barriers= maintain hospital stay for aggressive IV diuretics, quite elevated blood pressure.  She needs better control and slow correction  Subjective: Tells me her headache is much better and overall feels better but her systolic blood pressure still remains greater than 190-200.  Severely hypokalemic this morning.  Review of Systems Otherwise negative except as per HPI, including: General = no fevers, chills, dizziness,  fatigue HEENT/EYES = negative for loss of vision, double vision, blurred vision,  sore throa Cardiovascular= negative for chest pain, palpitation Respiratory/lungs= negative for shortness of breath, cough, wheezing; hemoptysis,  Gastrointestinal= negative for nausea, vomiting, abdominal pain Genitourinary= negative for Dysuria MSK = Negative for arthralgia, myalgias Neurology= Negative for headache, numbness, tingling  Psychiatry= Negative for suicidal and homocidal ideation Skin= Negative for Rash   Examination:  Constitutional: Not in acute distress Respiratory: Clear to auscultation bilaterally Cardiovascular: Normal sinus rhythm, no rubs Abdomen: Nontender nondistended good bowel sounds Musculoskeletal: No edema noted Skin: No rashes seen Neurologic: CN 2-12 grossly intact.  And nonfocal Psychiatric: Normal judgment and insight. Alert and oriented x 3. Normal mood.      Objective: Vitals:   11/13/19 0625 11/13/19 0749 11/13/19 0814 11/13/19 1103  BP: (!) 184/101 (!) 171/99 (!) 183/101 (!) 161/95  Pulse: 72 76 74 72  Resp:  Temp:  98.5 F (36.9 C)  98.7 F (37.1 C)  TempSrc:  Oral  Oral  SpO2:  99% 100% 96%  Weight:      Height:        Intake/Output Summary (Last 24 hours) at 11/13/2019 1139 Last data filed at 11/13/2019 1105 Gross per 24 hour  Intake 807.77 ml  Output 5550 ml  Net -4742.23 ml  Filed Weights    11/11/19 0509 11/12/19 0400 11/13/19 0438  Weight: 113.9 kg 110 kg 105.8 kg     Data Reviewed:   CBC: Recent Labs  Lab 11/09/19 2020 11/10/19 1314 11/12/19 0435 11/13/19 0521  WBC 10.8* 15.0* 14.0* 17.9*  HGB 13.2 12.9 13.3 14.3  HCT 37.9 38.1 41.0 41.5  MCV 92.9 93.4 98.3 92.6  PLT 230 248 233 161   Basic Metabolic Panel: Recent Labs  Lab 11/09/19 2020 11/10/19 1314 11/12/19 0435 11/13/19 0521  NA 139 139 138 133*  K 2.6* 3.0* 2.9* 2.3*  CL 100 102 103 91*  CO2 28 27 23 28   GLUCOSE 100* 109* 103* 109*  BUN 15 11 7 8   CREATININE 0.96 1.06* 0.87 1.19*  CALCIUM 8.9 8.3* 8.3* 9.0  MG  --  1.7 1.9 1.9   GFR: Estimated Creatinine Clearance: 80.5 mL/min (A) (by C-G formula based on SCr of 1.19 mg/dL (H)). Liver Function Tests: Recent Labs  Lab 11/09/19 2020  AST 34  ALT 32  ALKPHOS 51  BILITOT 1.4*  PROT 6.4*  ALBUMIN 3.2*   No results for input(s): LIPASE, AMYLASE in the last 168 hours. No results for input(s): AMMONIA in the last 168 hours. Coagulation Profile: No results for input(s): INR, PROTIME in the last 168 hours. Cardiac Enzymes: No results for input(s): CKTOTAL, CKMB, CKMBINDEX, TROPONINI in the last 168 hours. BNP (last 3 results) No results for input(s): PROBNP in the last 8760 hours. HbA1C: Recent Labs    11/11/19 0808  HGBA1C 5.4   CBG: No results for input(s): GLUCAP in the last 168 hours. Lipid Profile: Recent Labs    11/11/19 0808  CHOL 175  HDL 36*  LDLCALC 120*  TRIG 93  CHOLHDL 4.9   Thyroid Function Tests: Recent Labs    11/11/19 0808  TSH 2.975   Anemia Panel: No results for input(s): VITAMINB12, FOLATE, FERRITIN, TIBC, IRON, RETICCTPCT in the last 72 hours. Sepsis Labs: Recent Labs  Lab 11/11/19 0808  PROCALCITON <0.10    Recent Results (from the past 240 hour(s))  Respiratory Panel by RT PCR (Flu A&B, Covid) - Nasopharyngeal Swab     Status: None   Collection Time: 11/09/19 10:25 PM   Specimen:  Nasopharyngeal Swab  Result Value Ref Range Status   SARS Coronavirus 2 by RT PCR NEGATIVE NEGATIVE Final    Comment: (NOTE) SARS-CoV-2 target nucleic acids are NOT DETECTED. The SARS-CoV-2 RNA is generally detectable in upper respiratoy specimens during the acute phase of infection. The lowest concentration of SARS-CoV-2 viral copies this assay can detect is 131 copies/mL. A negative result does not preclude SARS-Cov-2 infection and should not be used as the sole basis for treatment or other patient management decisions. A negative result may occur with  improper specimen collection/handling, submission of specimen other than nasopharyngeal swab, presence of viral mutation(s) within the areas targeted by this assay, and inadequate number of viral copies (<131 copies/mL). A negative result must be combined with clinical observations, patient history, and epidemiological information. The expected result is Negative. Fact Sheet for Patients:  PinkCheek.be Fact Sheet for Healthcare Providers:  GravelBags.it This test is not yet ap proved or cleared by the [place] FDA and  has been authorized for detection and/or diagnosis of SARS-CoV-2 by FDA under an Emergency Use Authorization (EUA). This EUA will remain  in effect (meaning this test can be used) for the duration of the COVID-19 declaration under Section 564(b)(1) of the Act, 21 U.S.C.  section 360bbb-3(b)(1), unless the authorization is terminated or revoked sooner.    Influenza A by PCR NEGATIVE NEGATIVE Final   Influenza B by PCR NEGATIVE NEGATIVE Final    Comment: (NOTE) The Xpert Xpress SARS-CoV-2/FLU/RSV assay is intended as an aid in  the diagnosis of influenza from Nasopharyngeal swab specimens and  should not be used as a sole basis for treatment. Nasal washings and  aspirates are unacceptable for Xpert Xpress SARS-CoV-2/FLU/RSV  testing. Fact Sheet for  Patients: https://www.moore.com/ Fact Sheet for Healthcare Providers: https://www.young.biz/ This test is not yet approved or cleared by the [place] FDA and  has been authorized for detection and/or diagnosis of SARS-CoV-2 by  FDA under an Emergency Use Authorization (EUA). This EUA will remain  in effect (meaning this test can be used) for the duration of the  Covid-19 declaration under Section 564(b)(1) of the Act, 21  U.S.C. section 360bbb-3(b)(1), unless the authorization is  terminated or revoked. Performed at [facility], [address] [place], [place] [address]          Radiology Studies: VAS [place] LOWER EXTREMITY VENOUS (DVT)  Result Date: 11/11/2019  Lower Venous DVTStudy Indications: Edema.  Comparison Study: No prior study Performing Technologist: Gertie Fey MHA, RDMS, RVT, RDCS  Examination Guidelines: A complete evaluation includes B-mode imaging, spectral Doppler, color Doppler, and power Doppler as needed of all accessible portions of each vessel. Bilateral testing is considered an integral part of a complete examination. Limited examinations for reoccurring indications may be performed as noted. The reflux portion of the exam is performed with the patient in reverse Trendelenburg.  +---------+---------------+---------+-----------+----------+--------------+ RIGHT    CompressibilityPhasicitySpontaneityPropertiesThrombus Aging +---------+---------------+---------+-----------+----------+--------------+ CFV      Full           No       Yes                                 +---------+---------------+---------+-----------+----------+--------------+ SFJ      Full                                                        +---------+---------------+---------+-----------+----------+--------------+ FV Prox  Full                                                         +---------+---------------+---------+-----------+----------+--------------+ FV Mid   Full                                                        +---------+---------------+---------+-----------+----------+--------------+ FV DistalFull                                                        +---------+---------------+---------+-----------+----------+--------------+ PFV      Full                                                        +---------+---------------+---------+-----------+----------+--------------+  POP      Full           No       Yes                                 +---------+---------------+---------+-----------+----------+--------------+ PTV      Full                                                        +---------+---------------+---------+-----------+----------+--------------+ PERO     Full                                                        +---------+---------------+---------+-----------+----------+--------------+   +---------+---------------+---------+-----------+----------+--------------+ LEFT     CompressibilityPhasicitySpontaneityPropertiesThrombus Aging +---------+---------------+---------+-----------+----------+--------------+ CFV      Full           No       Yes                                 +---------+---------------+---------+-----------+----------+--------------+ SFJ      Full                                                        +---------+---------------+---------+-----------+----------+--------------+ FV Prox  Full                                                        +---------+---------------+---------+-----------+----------+--------------+ FV Mid   Full                                                        +---------+---------------+---------+-----------+----------+--------------+ FV DistalFull                                                         +---------+---------------+---------+-----------+----------+--------------+ PFV      Full                                                        +---------+---------------+---------+-----------+----------+--------------+ POP      Full           No       Yes                                 +---------+---------------+---------+-----------+----------+--------------+  PTV      Full                    Yes                                 +---------+---------------+---------+-----------+----------+--------------+ PERO     Full                    Yes                                 +---------+---------------+---------+-----------+----------+--------------+     Summary: RIGHT: - There is no evidence of deep vein thrombosis in the lower extremity.  - No cystic structure found in the popliteal fossa.  LEFT: - There is no evidence of deep vein thrombosis in the lower extremity.  - No cystic structure found in the popliteal fossa.  Pulsatile lower extremity venous flow is suggestive of possible elevated right heart pressure.  *See table(s) above for measurements and observations. Electronically signed by Gretta Began MD on 11/11/2019 at 6:40:04 PM.    Final    VAS US RENAL ARTERY DUPLEX  Result Date: 11/12/2019 ABDOMINAL VISCERAL Indications: HTN High Risk Factors: Hypertension. Limitations: Air/bowel gas and obesity. Comparison Study: No prior exam Performing Technologist: Kennedy Bucker RDMS, RVT  Examination Guidelines: A complete evaluation includes B-mode imaging, spectral Doppler, color Doppler, and power Doppler as needed of all accessible portions of each vessel. Bilateral testing is considered an integral part of a complete examination. Limited examinations for reoccurring indications may be performed as noted.  Duplex Findings: +--------------------+--------+--------+------+--------+ Mesenteric          PSV cm/sEDV cm/sPlaqueComments  +--------------------+--------+--------+------+--------+ Aorta Prox            119                          +--------------------+--------+--------+------+--------+ Celiac Artery Origin  131                          +--------------------+--------+--------+------+--------+ SMA Proximal          200                          +--------------------+--------+--------+------+--------+    +------------------+--------+--------+--------------+ Right Renal ArteryPSV cm/sEDV cm/s   Comment     +------------------+--------+--------+--------------+ Origin                            Not visualized +------------------+--------+--------+--------------+ Proximal            166      31                  +------------------+--------+--------+--------------+ Mid                  57      15                  +------------------+--------+--------+--------------+ Distal               90      22                  +------------------+--------+--------+--------------+ +-----------------+--------+--------+--------------+ Left Renal ArteryPSV cm/sEDV cm/s   Comment     +-----------------+--------+--------+--------------+  Origin                           Not visualized +-----------------+--------+--------+--------------+ Proximal                         Not visualized +-----------------+--------+--------+--------------+ Mid                 91      28                  +-----------------+--------+--------+--------------+ Distal              44      12                  +-----------------+--------+--------+--------------+ +------------+--------+--------+----+-----------+--------+--------+----+ Right KidneyPSV cm/sEDV cm/sRI  Left KidneyPSV cm/sEDV cm/sRI   +------------+--------+--------+----+-----------+--------+--------+----+ Upper Pole  26      7       0.73Upper Pole 43      14      0.67 +------------+--------+--------+----+-----------+--------+--------+----+ Mid          36      13      0.        29      12      0.60 +------------+--------+--------+----+-----------+--------+--------+----+ Lower Pole  24      8       0.67Lower Pole 39      14      0.65 +------------+--------+--------+----+-----------+--------+--------+----+ Hilar       88      25      0.72Hilar      36      14      0.62 +------------+--------+--------+----+-----------+--------+--------+----+ +------------------+-----+------------------+-----+ Right Kidney           Left Kidney             +------------------+-----+------------------+-----+ RAR                    RAR                     +------------------+-----+------------------+-----+ RAR (manual)      1.39 RAR (manual)      0.76  +------------------+-----+------------------+-----+ Cortex                 Cortex                  +------------------+-----+------------------+-----+ Cortex thickness       Corex thickness         +------------------+-----+------------------+-----+ Kidney length (cm)11.50Kidney length (cm)11.20 +------------------+-----+------------------+-----+  Summary: Renal:  Right: Normal size right kidney. Abnormal right Resistive Index. No        evidence of right renal artery stenosis. RRV flow present. Left:  Normal size of left kidney. Normal left Resistive Index. No        evidence of left renal artery stenosis. LRV flow present. Mesenteric: Normal Celiac artery and Superior Mesenteric artery findings. Areas of limited visceral study include right renal artery and left renal artery.  *See table(s) above for measurements and observations.  Diagnosing physician: Sherald Hess MD  Electronically signed by Sherald Hess MD on 11/12/2019 at 5:12:24 PM.    Final         Scheduled Meds: . amLODipine  10 mg Oral Daily  . cloNIDine  0.2 mg Oral TID  . enoxaparin (LOVENOX) injection  40 mg Subcutaneous Q24H  . furosemide  40 mg Intravenous  BID  . hydrALAZINE  100 mg  Oral TID  . potassium chloride  40 mEq Oral Q4H  . sodium chloride flush  3 mL Intravenous Q12H   Continuous Infusions: . nitroGLYCERIN 45 mcg/min (11/13/19 1106)     LOS: 4 days   Time spent= 35 mins    Kathlee Barnhardt Joline Maxcy, MD [facility]  If 7PM-7AM, please contact night-coverage  11/13/2019, 11:39 AM

## 2019-11-14 LAB — BASIC METABOLIC PANEL
Anion gap: 16 — ABNORMAL HIGH (ref 5–15)
BUN: 17 mg/dL (ref 6–20)
CO2: 25 mmol/L (ref 22–32)
Calcium: 9 mg/dL (ref 8.9–10.3)
Chloride: 92 mmol/L — ABNORMAL LOW (ref 98–111)
Creatinine, Ser: 1.31 mg/dL — ABNORMAL HIGH (ref 0.44–1.00)
GFR calc Af Amer: 58 mL/min — ABNORMAL LOW (ref >60)
GFR calc non Af Amer: 50 mL/min — ABNORMAL LOW (ref >60)
Glucose, Bld: 109 mg/dL — ABNORMAL HIGH (ref 70–99)
Potassium: 2.9 mmol/L — ABNORMAL LOW (ref 3.5–5.1)
Sodium: 133 mmol/L — ABNORMAL LOW (ref 135–145)

## 2019-11-14 LAB — BASIC METABOLIC PANEL WITH GFR
Anion gap: 10 (ref 5–15)
BUN: 13 mg/dL (ref 6–20)
CO2: 32 mmol/L (ref 22–32)
Calcium: 9.3 mg/dL (ref 8.9–10.3)
Chloride: 93 mmol/L — ABNORMAL LOW (ref 98–111)
Creatinine, Ser: 1.13 mg/dL — ABNORMAL HIGH (ref 0.44–1.00)
GFR calc Af Amer: >60 mL/min
GFR calc non Af Amer: >60 mL/min
Glucose, Bld: 89 mg/dL (ref 70–99)
Potassium: 3.6 mmol/L (ref 3.5–5.1)
Sodium: 135 mmol/L (ref 135–145)

## 2019-11-14 LAB — CBC
HCT: 42.2 % (ref 36.0–46.0)
Hemoglobin: 14.5 g/dL (ref 12.0–15.0)
MCH: 31.4 pg (ref 26.0–34.0)
MCHC: 34.4 g/dL (ref 30.0–36.0)
MCV: 91.3 fL (ref 80.0–100.0)
Platelets: 260 10*3/uL (ref 150–400)
RBC: 4.62 MIL/uL (ref 3.87–5.11)
RDW: 13.7 % (ref 11.5–15.5)
WBC: 14.7 10*3/uL — ABNORMAL HIGH (ref 4.0–10.5)
nRBC: 0 % (ref 0.0–0.2)

## 2019-11-14 LAB — MAGNESIUM: Magnesium: 1.9 mg/dL (ref 1.7–2.4)

## 2019-11-14 LAB — BRAIN NATRIURETIC PEPTIDE: B Natriuretic Peptide: 94.9 pg/mL (ref 0.0–100.0)

## 2019-11-14 MED ORDER — SALINE SPRAY 0.65 % NA SOLN
1.0000 | NASAL | Status: DC | PRN
  Administered 2019-11-14 (×2): 1 via NASAL
  Filled 2019-11-14: qty 44

## 2019-11-14 MED ORDER — POTASSIUM CHLORIDE 10 MEQ/100ML IV SOLN
10.0000 meq | INTRAVENOUS | Status: AC
  Administered 2019-11-14 (×4): 10 meq via INTRAVENOUS
  Filled 2019-11-14 (×4): qty 100

## 2019-11-14 MED ORDER — HYDRALAZINE HCL 100 MG PO TABS: 100.0000 mg | ORAL_TABLET | Freq: Three times a day (TID) | ORAL | 0 refills | Status: DC

## 2019-11-14 MED ORDER — POTASSIUM CHLORIDE CRYS ER 20 MEQ PO TBCR
40.0000 meq | EXTENDED_RELEASE_TABLET | ORAL | Status: AC
  Administered 2019-11-14 (×2): 40 meq via ORAL
  Filled 2019-11-14 (×2): qty 2

## 2019-11-14 MED ORDER — AMLODIPINE BESYLATE 10 MG PO TABS: 10.0000 mg | ORAL_TABLET | Freq: Every day | ORAL | 0 refills | Status: DC

## 2019-11-14 MED ORDER — CLONIDINE HCL 0.2 MG PO TABS: 0.2000 mg | ORAL_TABLET | Freq: Three times a day (TID) | ORAL | 0 refills | Status: DC

## 2019-11-14 MED ORDER — POTASSIUM CHLORIDE CRYS ER 20 MEQ PO TBCR: 40.0000 meq | EXTENDED_RELEASE_TABLET | Freq: Two times a day (BID) | ORAL | 0 refills | Status: DC

## 2019-11-14 MED ORDER — MAGNESIUM OXIDE 400 (241.3 MG) MG PO TABS
800.0000 mg | ORAL_TABLET | Freq: Once | ORAL | Status: AC
  Administered 2019-11-14: 800 mg via ORAL
  Filled 2019-11-14: qty 2

## 2019-11-14 NOTE — Progress Notes (Signed)
Discharge instructions went over with patient, all questions answered. Telemetry removed, IVs removed. Patient to follow up at wellness clinic for labs and BP medication review. 24-hour urine collection delivered to lab.

## 2019-11-14 NOTE — TOC Transition Note (Signed)
Transition of Care [facility]) - CM/SW Discharge Note   Patient Details  Name: Robin Jarvis MRN: [id number] Date of Birth: 1976-06-15  [facility] CM/SW Contact:  Lawerance Sabal, RN Phone Number: 11/14/2019, 9:44 AM   Clinical Narrative:    Patient provided with MATCH letter. Demographics sent to CMA to call patient next week with follow up appointment to a [facility] for continued follow up support.           Patient Goals and CMS Choice        Discharge Placement                       Discharge Plan and Services                                     Social Determinants of Health (SDOH) Interventions     Readmission Risk Interventions No flowsheet data found.

## 2019-11-14 NOTE — Discharge Instructions (Signed)

## 2019-11-14 NOTE — Discharge Summary (Signed)
Physician Discharge Summary  Robin Jarvis OIZ:[id number] DOB: April 29, 1976 DOA: 11/09/2019  PCP: System, Provider Not In  Admit date: 11/09/2019 Discharge date: 11/14/2019  Admitted From: Home Disposition: Home  Recommendations for Outpatient Follow-up:  Advised to [facility]-up with [facility] on Monday [facility]/5.  Obtain repeat lab work BMP to check potassium Clonidine 0.2 mg 3 times daily, hydralazine 100 mg 3 times daily has been prescribed.  Norvasc increased to 10 mg daily. Potassium supplements has been given, 3 doses. Advised to obtain outpatient blood pressure measuring device and keep a log of it  Home Health: None Equipment/Devices: None Discharge Condition: Stable CODE STATUS: Full Diet recommendation: 2 g salt diet  Brief/Interim Summary: 44 year old with history of essential hypertension, herpes simplex came to the hospital with complaints of dyspnea on exertion and chest tightness.  She was noted to have elevated blood pressure reporting of headaches.  Upon admission she was in hypertensive emergency, hypokalemia, elevated BNP.  Chest x-ray was suggestive of cardiomegaly.  Elevated D-dimer, CTA was negative for PE but showed cardiomegaly with small pleural effusion and concerns for pulmonary edema.  Hypertensive emergency, uncontrolled History of essential hypertension -Blood pressures under better control now, nitroglycerin drip has been turned off.  Norvasc increased to 10 mg daily, added hydralazine 100 mg 3 times daily and clonidine 0.2 mg 3 times daily. -2 g salt diet -Recommend outpatient obtaining blood pressure measuring device and keep a log of this. -Renal arterial Dopplers -negative -CT of the chest-were able to see adrenals which appeared normal.  Hypokalemia -Aggressive repletion has been ordered.  Should be getting outpatient lab work on 4/5 to check her potassium and creatinine.  She will be going home on potassium supplements as well.  Dyspnea on  exertion with cardiomegaly, resolved Chronic diastolic congestive heart failure with preserved ejection fraction 55% Elevated BNP, resolved -Echocardiogram showed EF of 55%, grade 1 diastolic dysfunction, severe LVH, moderate hypokinesis of the left ventricular wall.  Elevated pulmonary arterial systolic pressure. -BNP is trended down.  Creatinine slowly improving, symptomatically she is feeling much better at baseline.  Therefore discontinue Lasix. -LDL 120, A1c 5.4, TSH normal, procalcitonin-negative  Mild renal insufficiency -Baseline creatinine 0.9, this is trended up to 1.3 secondary to aggressive diuresis.  We have stopped further diuresis is symptomatically she is feeling much better.  This should improve on its own.  She will be getting outpatient lab work in 2 days to ensure this slowly continues to improve.    Discharge Diagnoses:  Principal Problem:   Hypertensive emergency Active Problems:   Dyspnea   Chest pain   Weight gain   Cardiomegaly   Elevated brain natriuretic peptide (BNP) level   Hypokalemia    Consultations:  None  Subjective: Feels much better, denies any shortness of breath, headaches or any other complaints.  Wishes to go home today.  Discharge Exam: Vitals:   11/14/19 0751 11/14/19 0915  BP: (!) 147/84 (!) 143/82  Pulse: 79   Resp: 20   Temp: 99.9 F (37.7 C)   SpO2: 100%    Vitals:   11/14/19 0355 11/14/19 0631 11/14/19 0751 11/14/19 0915  BP: 140/84 (!) 147/85 (!) 147/84 (!) 143/82  Pulse: 70 75 79   Resp: 18  20   Temp: 97.9 F (36.6 C) 98.8 F (37.1 C) 99.9 F (37.7 C)   TempSrc: Oral Oral Oral   SpO2: 99%  100%   Weight: 105.7 kg     Height:  General: Pt is alert, awake, not in acute distress Cardiovascular: RRR, S1/S2 +, no rubs, no gallops Respiratory: CTA bilaterally, no wheezing, no rhonchi Abdominal: Soft, NT, ND, bowel sounds + Extremities: no edema, no cyanosis  Discharge Instructions   Allergies as of  11/14/2019      Reactions   Lisinopril Swelling      Medication List    STOP taking these medications   ibuprofen 200 MG tablet Commonly known as: ADVIL     TAKE these medications   acetaminophen 500 MG tablet Commonly known as: TYLENOL Take 1,000 mg by mouth every 6 (six) hours as needed for mild pain.   amLODipine 10 MG tablet Commonly known as: NORVASC Take 1 tablet (10 mg total) by mouth daily. Start taking on: November 15, 2019 What changed:   medication strength  how much to take   cloNIDine 0.2 MG tablet Commonly known as: CATAPRES Take 1 tablet (0.2 mg total) by mouth 3 (three) times daily.   hydrALAZINE 100 MG tablet Commonly known as: APRESOLINE Take 1 tablet (100 mg total) by mouth 3 (three) times daily.   multivitamin with minerals Tabs tablet Take 1 tablet by mouth daily.   potassium chloride SA 20 MEQ tablet Commonly known as: KLOR-CON Take 2 tablets (40 mEq total) by mouth 2 (two) times daily for 3 doses.      Follow-up Information    [facility]. Schedule an appointment as soon as possible for a visit on 11/16/2019.   Why: Please follow up for hypertension and hypokalemia.  Contact information: [address] [place] [address] [phone]         Allergies  Allergen Reactions  . Lisinopril Swelling    You were cared for by a hospitalist during your hospital stay. If you have any questions about your discharge medications or the care you received while you were in the hospital after you are discharged, you can call the unit and asked to speak with the hospitalist on call if the hospitalist that took care of you is not available. Once you are discharged, your primary care physician will handle any further medical issues. Please note that no refills for any discharge medications will be authorized once you are discharged, as it is imperative that you return to your primary care physician (or  establish a relationship with a primary care physician if you do not have one) for your aftercare needs so that they can reassess your need for medications and monitor your lab values.   Procedures/Studies: CT ANGIO CHEST PE W OR WO CONTRAST  Result Date: 11/10/2019 CLINICAL DATA:  Shortness of breath. Chest pain for 3 days. Positive D-dimer. EXAM: CT ANGIOGRAPHY CHEST WITH CONTRAST TECHNIQUE: Multidetector CT imaging of the chest was performed using the standard protocol during bolus administration of intravenous contrast. Multiplanar CT image reconstructions and MIPs were obtained to evaluate the vascular anatomy. CONTRAST:  80mL OMNIPAQUE IOHEXOL 350 MG/ML SOLN COMPARISON:  Chest radiograph of 1 day prior. No prior CT. FINDINGS: Cardiovascular: The quality of this exam for evaluation of pulmonary embolism is moderate. The bolus is moderately well timed, with a large amount of contrast remaining in the SVC. Degradation secondary to patient size. To the upper lungs, no pulmonary embolism to the large segmental level. No subsegmental embolism to the lower lungs. Pulmonary artery enlargement, outflow tract 3.8 cm. The aorta is grossly normal in caliber, but not well opacified. Moderate cardiomegaly, with right sided chamber enlargement.  Contrast reflux in the IVC and hepatic veins. Mediastinum/Nodes: No well-defined mediastinal adenopathy. Suspect mediastinal edema, possibly related to fluid overload. No hilar adenopathy. Lungs/Pleura: Small left and trace right pleural fluid. Mosaic attenuation, favored to be related to air trapping and hypoventilation. Upper Abdomen: Normal imaged portions of the liver, spleen, stomach, pancreas, adrenal glands, kidneys. Musculoskeletal: Moderate thoracic spondylosis. Review of the MIP images confirms the above findings. IMPRESSION: 1. Moderate quality exam for evaluation of pulmonary embolism. No evidence of pulmonary embolism, with limitations above. 2. Pulmonary artery  enlargement suggests pulmonary arterial hypertension. Cardiomegaly with elevated right heart pressures. 3. Small left and trace right pleural fluid. Question fluid overload. Electronically Signed   By: Jeronimo Greaves M.D.   On: 11/10/2019 18:21   DG Chest Port 1 View  Result Date: 11/09/2019 CLINICAL DATA:  Chest pain and shortness of breath for several days EXAM: PORTABLE CHEST 1 VIEW COMPARISON:  03/09/2019 FINDINGS: Cardiac shadow is enlarged but stable. The lungs are clear bilaterally. No acute bony abnormality is noted. IMPRESSION: Stable cardiomegaly.  No acute abnormality seen. Electronically Signed   By: Alcide Clever M.D.   On: 11/09/2019 20:22   ECHOCARDIOGRAM COMPLETE  Result Date: 11/10/2019    ECHOCARDIOGRAM REPORT   Patient Name:   QUANNA WITTKE Date of Exam: 11/10/2019 Medical Rec #:  [id number]   Height:       69.0 in Accession #:    [id number]  Weight:       254.0 lb Date of Birth:  1975-11-07   BSA:          2.287 m Patient Age:    43 years    BP:           193/96 mmHg Patient Gender: F           HR:           71 bpm. Exam Location:  Inpatient Procedure: 2D Echo, Cardiac Doppler and Color Doppler Indications:    R07.9* Chest pain, unspecified  History:        Patient has prior history of Echocardiogram examinations, most                 recent 05/04/2008. Cardiomegaly, Signs/Symptoms:Dyspnea; Risk                 Factors:Hypertension.  Sonographer:    Sheralyn Boatman RDCS Referring Phys: [id number] RONDELL A SMITH IMPRESSIONS  1. Left ventricular ejection fraction, by estimation, is 50 to 55%. The left ventricle has low normal function. The left ventricle has no regional wall motion abnormalities. There is severe left ventricular hypertrophy. Left ventricular diastolic parameters are consistent with Grade I diastolic dysfunction (impaired relaxation). Elevated left ventricular end-diastolic pressure. There is moderate hypokinesis of the left ventricular, entire septal wall.  2. Right ventricular systolic  function is normal. The right ventricular size is normal. There is moderately elevated pulmonary artery systolic pressure.  3. Left atrial size was mildly dilated.  4. Right atrial size was mildly dilated.  5. The mitral valve is grossly normal. Trivial mitral valve regurgitation.  6. Tricuspid valve regurgitation is moderate.  7. The aortic valve was not well visualized. Aortic valve regurgitation is not visualized.  8. The inferior vena cava is dilated in size with <50% respiratory variability, suggesting right atrial pressure of 15 mmHg.  9. Trivial to small circumferential pericardial effusion. Tamponade is not suspected.S FINDINGS  Left Ventricle: Left ventricular ejection fraction, by estimation, is 50 to 55%. The left ventricle  has low normal function. The left ventricle has no regional wall motion abnormalities. Moderate hypokinesis of the left ventricular, entire septal wall. The left ventricular internal cavity size was normal in size. There is severe left ventricular hypertrophy. Left ventricular diastolic parameters are consistent with Grade I diastolic dysfunction (impaired relaxation). Elevated left ventricular end-diastolic pressure. Right Ventricle: The right ventricular size is normal. No increase in right ventricular wall thickness. Right ventricular systolic function is normal. There is moderately elevated pulmonary artery systolic pressure. The tricuspid regurgitant velocity is 3.47 m/s, and with an assumed right atrial pressure of 15 mmHg, the estimated right ventricular systolic pressure is 63.2 mmHg. Left Atrium: Left atrial size was mildly dilated. Right Atrium: Right atrial size was mildly dilated. Pericardium: A small pericardial effusion is present. The pericardial effusion is circumferential. There is no evidence of cardiac tamponade. Mitral Valve: The mitral valve is grossly normal. Trivial mitral valve regurgitation. Tricuspid Valve: The tricuspid valve is grossly normal. Tricuspid  valve regurgitation is moderate. Aortic Valve: The aortic valve was not well visualized. Aortic valve regurgitation is not visualized. Pulmonic Valve: The pulmonic valve was grossly normal. Pulmonic valve regurgitation is trivial. Aorta: The aortic root and ascending aorta are structurally normal, with no evidence of dilitation. Venous: The inferior vena cava is dilated in size with less than 50% respiratory variability, suggesting right atrial pressure of 15 mmHg. IAS/Shunts: No atrial level shunt detected by color flow Doppler.  LEFT VENTRICLE PLAX 2D LVIDd:         4.60 cm      Diastology LVIDs:         3.35 cm      LV e' lateral:   5.56 cm/s LV PW:         1.82 cm      LV E/e' lateral: 23.4 LV IVS:        1.79 cm      LV e' medial:    7.40 cm/s LVOT diam:     2.00 cm      LV E/e' medial:  17.6 LV SV:         58 LV SV Index:   26 LVOT Area:     3.14 cm  LV Volumes (MOD) LV vol d, MOD A2C: 103.0 ml LV vol d, MOD A4C: 120.0 ml LV vol s, MOD A2C: 43.1 ml LV vol s, MOD A4C: 63.9 ml LV SV MOD A2C:     59.9 ml LV SV MOD A4C:     120.0 ml LV SV MOD BP:      63.5 ml RIGHT VENTRICLE             IVC RV S prime:     11.60 cm/s  IVC diam: 2.86 cm TAPSE (M-mode): 1.8 cm LEFT ATRIUM             Index       RIGHT ATRIUM           Index LA diam:        4.80 cm 2.10 cm/m  RA Area:     25.80 cm LA Vol (A2C):   72.3 ml 31.62 ml/m RA Volume:   86.30 ml  37.74 ml/m LA Vol (A4C):   90.3 ml 39.49 ml/m LA Biplane Vol: 81.9 ml 35.81 ml/m  AORTIC VALVE LVOT Vmax:   132.00 cm/s LVOT Vmean:  93.300 cm/s LVOT VTI:    0.186 m  AORTA Ao Root diam: 3.30 cm Ao Asc diam:  3.70 cm  MITRAL VALVE                TRICUSPID VALVE MV Area (PHT): 3.12 cm     TR Peak grad:   48.2 mmHg MV Decel Time: 243 msec     TR Vmax:        347.00 cm/s MV E velocity: 130.00 cm/s MV A velocity: 37.20 cm/s   SHUNTS MV E/A ratio:  3.49         Systemic VTI:  0.19 m                             Systemic Diam: 2.00 cm Zoila Shutter MD Electronically signed by Zoila Shutter MD Signature Date/Time: 11/10/2019/4:24:47 PM    Final    VAS [place] LOWER EXTREMITY VENOUS (DVT)  Result Date: 11/11/2019  Lower Venous DVTStudy Indications: Edema.  Comparison Study: No prior study Performing Technologist: Gertie Fey MHA, RDMS, RVT, RDCS  Examination Guidelines: A complete evaluation includes B-mode imaging, spectral Doppler, color Doppler, and power Doppler as needed of all accessible portions of each vessel. Bilateral testing is considered an integral part of a complete examination. Limited examinations for reoccurring indications may be performed as noted. The reflux portion of the exam is performed with the patient in reverse Trendelenburg.  +---------+---------------+---------+-----------+----------+--------------+ RIGHT    CompressibilityPhasicitySpontaneityPropertiesThrombus Aging +---------+---------------+---------+-----------+----------+--------------+ CFV      Full           No       Yes                                 +---------+---------------+---------+-----------+----------+--------------+ SFJ      Full                                                        +---------+---------------+---------+-----------+----------+--------------+ FV Prox  Full                                                        +---------+---------------+---------+-----------+----------+--------------+ FV Mid   Full                                                        +---------+---------------+---------+-----------+----------+--------------+ FV DistalFull                                                        +---------+---------------+---------+-----------+----------+--------------+ PFV      Full                                                        +---------+---------------+---------+-----------+----------+--------------+  POP      Full           No       Yes                                  +---------+---------------+---------+-----------+----------+--------------+ PTV      Full                                                        +---------+---------------+---------+-----------+----------+--------------+ PERO     Full                                                        +---------+---------------+---------+-----------+----------+--------------+   +---------+---------------+---------+-----------+----------+--------------+ LEFT     CompressibilityPhasicitySpontaneityPropertiesThrombus Aging +---------+---------------+---------+-----------+----------+--------------+ CFV      Full           No       Yes                                 +---------+---------------+---------+-----------+----------+--------------+ SFJ      Full                                                        +---------+---------------+---------+-----------+----------+--------------+ FV Prox  Full                                                        +---------+---------------+---------+-----------+----------+--------------+ FV Mid   Full                                                        +---------+---------------+---------+-----------+----------+--------------+ FV DistalFull                                                        +---------+---------------+---------+-----------+----------+--------------+ PFV      Full                                                        +---------+---------------+---------+-----------+----------+--------------+ POP      Full           No       Yes                                 +---------+---------------+---------+-----------+----------+--------------+  PTV      Full                    Yes                                 +---------+---------------+---------+-----------+----------+--------------+ PERO     Full                    Yes                                  +---------+---------------+---------+-----------+----------+--------------+     Summary: RIGHT: - There is no evidence of deep vein thrombosis in the lower extremity.  - No cystic structure found in the popliteal fossa.  LEFT: - There is no evidence of deep vein thrombosis in the lower extremity.  - No cystic structure found in the popliteal fossa.  Pulsatile lower extremity venous flow is suggestive of possible elevated right heart pressure.  *See table(s) above for measurements and observations. Electronically signed by Curt Jews MD on 11/11/2019 at 6:40:04 PM.    Final    VAS US RENAL ARTERY DUPLEX  Result Date: 11/12/2019 ABDOMINAL VISCERAL Indications: HTN High Risk Factors: Hypertension. Limitations: Air/bowel gas and obesity. Comparison Study: No prior exam Performing Technologist: Baldwin Crown RDMS, RVT  Examination Guidelines: A complete evaluation includes B-mode imaging, spectral Doppler, color Doppler, and power Doppler as needed of all accessible portions of each vessel. Bilateral testing is considered an integral part of a complete examination. Limited examinations for reoccurring indications may be performed as noted.  Duplex Findings: +--------------------+--------+--------+------+--------+ Mesenteric          PSV cm/sEDV cm/sPlaqueComments +--------------------+--------+--------+------+--------+ Aorta Prox            119                          +--------------------+--------+--------+------+--------+ Celiac Artery Origin  131                          +--------------------+--------+--------+------+--------+ SMA Proximal          200                          +--------------------+--------+--------+------+--------+    +------------------+--------+--------+--------------+ Right Renal ArteryPSV cm/sEDV cm/s   Comment     +------------------+--------+--------+--------------+ Origin                            Not visualized  +------------------+--------+--------+--------------+ Proximal            166      31                  +------------------+--------+--------+--------------+ Mid                  57      15                  +------------------+--------+--------+--------------+ Distal               90      22                  +------------------+--------+--------+--------------+ +-----------------+--------+--------+--------------+ Left Renal ArteryPSV cm/sEDV cm/s   Comment     +-----------------+--------+--------+--------------+  Origin                           Not visualized +-----------------+--------+--------+--------------+ Proximal                         Not visualized +-----------------+--------+--------+--------------+ Mid                 91      28                  +-----------------+--------+--------+--------------+ Distal              44      12                  +-----------------+--------+--------+--------------+ +------------+--------+--------+----+-----------+--------+--------+----+ Right KidneyPSV cm/sEDV cm/sRI  Left KidneyPSV cm/sEDV cm/sRI   +------------+--------+--------+----+-----------+--------+--------+----+ Upper Pole  26      7       0.73Upper Pole 43      14      0.67 +------------+--------+--------+----+-----------+--------+--------+----+ Mid         36      13      0.        29      12      0.60 +------------+--------+--------+----+-----------+--------+--------+----+ Lower Pole  24      8       0.67Lower Pole 39      14      0.65 +------------+--------+--------+----+-----------+--------+--------+----+ Hilar       88      25      0.72Hilar      36      14      0.62 +------------+--------+--------+----+-----------+--------+--------+----+ +------------------+-----+------------------+-----+ Right Kidney           Left Kidney             +------------------+-----+------------------+-----+ RAR                    RAR                      +------------------+-----+------------------+-----+ RAR (manual)      1.39 RAR (manual)      0.76  +------------------+-----+------------------+-----+ Cortex                 Cortex                  +------------------+-----+------------------+-----+ Cortex thickness       Corex thickness         +------------------+-----+------------------+-----+ Kidney length (cm)11.50Kidney length (cm)11.20 +------------------+-----+------------------+-----+  Summary: Renal:  Right: Normal size right kidney. Abnormal right Resistive Index. No        evidence of right renal artery stenosis. RRV flow present. Left:  Normal size of left kidney. Normal left Resistive Index. No        evidence of left renal artery stenosis. LRV flow present. Mesenteric: Normal Celiac artery and Superior Mesenteric artery findings. Areas of limited visceral study include right renal artery and left renal artery.  *See table(s) above for measurements and observations.  Diagnosing physician: Sherald Hess MD  Electronically signed by Sherald Hess MD on 11/12/2019 at 5:12:24 PM.    Final       The results of significant diagnostics from this hospitalization (including imaging, microbiology, ancillary and laboratory) are listed below for reference.     Microbiology: Recent Results (from the past 240 hour(s))  Respiratory Panel by RT  PCR (Flu A&B, Covid) - Nasopharyngeal Swab     Status: None   Collection Time: 11/09/19 10:25 PM   Specimen: Nasopharyngeal Swab  Result Value Ref Range Status   SARS Coronavirus 2 by RT PCR NEGATIVE NEGATIVE Final    Comment: (NOTE) SARS-CoV-2 target nucleic acids are NOT DETECTED. The SARS-CoV-2 RNA is generally detectable in upper respiratoy specimens during the acute phase of infection. The lowest concentration of SARS-CoV-2 viral copies this assay can detect is 131 copies/mL. A negative result does not preclude SARS-Cov-2 infection and should not be used  as the sole basis for treatment or other patient management decisions. A negative result may occur with  improper specimen collection/handling, submission of specimen other than nasopharyngeal swab, presence of viral mutation(s) within the areas targeted by this assay, and inadequate number of viral copies (<131 copies/mL). A negative result must be combined with clinical observations, patient history, and epidemiological information. The expected result is Negative. Fact Sheet for Patients:  https://www.moore.com/ Fact Sheet for Healthcare Providers:  https://www.young.biz/ This test is not yet ap proved or cleared by the [place] FDA and  has been authorized for detection and/or diagnosis of SARS-CoV-2 by FDA under an Emergency Use Authorization (EUA). This EUA will remain  in effect (meaning this test can be used) for the duration of the COVID-19 declaration under Section 564(b)(1) of the Act, 21 U.S.C. section 360bbb-3(b)(1), unless the authorization is terminated or revoked sooner.    Influenza A by PCR NEGATIVE NEGATIVE Final   Influenza B by PCR NEGATIVE NEGATIVE Final    Comment: (NOTE) The Xpert Xpress SARS-CoV-2/FLU/RSV assay is intended as an aid in  the diagnosis of influenza from Nasopharyngeal swab specimens and  should not be used as a sole basis for treatment. Nasal washings and  aspirates are unacceptable for Xpert Xpress SARS-CoV-2/FLU/RSV  testing. Fact Sheet for Patients: https://www.moore.com/ Fact Sheet for Healthcare Providers: https://www.young.biz/ This test is not yet approved or cleared by the [place] FDA and  has been authorized for detection and/or diagnosis of SARS-CoV-2 by  FDA under an Emergency Use Authorization (EUA). This EUA will remain  in effect (meaning this test can be used) for the duration of the  Covid-19 declaration under Section 564(b)(1) of the Act,  21  U.S.C. section 360bbb-3(b)(1), unless the authorization is  terminated or revoked. Performed at [facility], [address] [place], [place] [address]      Labs: BNP (last 3 results) Recent Labs    11/09/19 2020 11/14/19 0512  BNP 713.0* 94.9   Basic Metabolic Panel: Recent Labs  Lab 11/10/19 1314 11/12/19 0435 11/13/19 0521 11/13/19 1446 11/14/19 0512  NA 139 138 133* 133* 133*  K 3.0* 2.9* 2.3* 3.0* 2.9*  CL 102 103 91* 94* 92*  CO2 GLUCOSE 109* 103* 109* 105* 109*  BUN CREATININE 1.06* 0.87 1.19* 1.24* 1.31*  CALCIUM 8.3* 8.3* 9.0 8.9 9.0  MG 1.7 1.9 1.9  --  1.9   Liver Function Tests: Recent Labs  Lab 11/09/19 2020  AST 34  ALT 32  ALKPHOS 51  BILITOT 1.4*  PROT 6.4*  ALBUMIN 3.2*   No results for input(s): LIPASE, AMYLASE in the last 168 hours. No results for input(s): AMMONIA in the last 168 hours. CBC: Recent Labs  Lab 11/09/19 2020 11/10/19 1314 11/12/19 0435 11/13/19 0521 11/14/19 0512  WBC 10.8* 15.0* 14.0* 17.9* 14.7*  HGB 13.2  12.9 13.3 14.3 14.5  HCT 37.9 38.1 41.0 41.5 42.2  MCV 92.9 93.4 98.3 92.6 91.3  PLT 230 248 233 298 260   Cardiac Enzymes: No results for input(s): CKTOTAL, CKMB, CKMBINDEX, TROPONINI in the last 168 hours. BNP: Invalid input(s): POCBNP CBG: No results for input(s): GLUCAP in the last 168 hours. D-Dimer No results for input(s): DDIMER in the last 72 hours. Hgb A1c No results for input(s): HGBA1C in the last 72 hours. Lipid Profile No results for input(s): CHOL, HDL, LDLCALC, TRIG, CHOLHDL, LDLDIRECT in the last 72 hours. Thyroid function studies No results for input(s): TSH, T4TOTAL, T3FREE, THYROIDAB in the last 72 hours.  Invalid input(s): FREET3 Anemia work up No results for input(s): VITAMINB12, FOLATE, FERRITIN, TIBC, IRON, RETICCTPCT in the last 72 hours. Urinalysis    Component Value Date/Time   COLORURINE YELLOW 03/09/2019 0357   APPEARANCEUR  CLEAR 03/09/2019 0357   LABSPEC 1.015 03/09/2019 0357   PHURINE 7.0 03/09/2019 0357   GLUCOSEU NEGATIVE 03/09/2019 0357   HGBUR TRACE (A) 03/09/2019 0357   BILIRUBINUR NEGATIVE 03/09/2019 0357   KETONESUR NEGATIVE 03/09/2019 0357   PROTEINUR 100 (A) 03/09/2019 0357   UROBILINOGEN 1.0 11/20/2011 0814   NITRITE NEGATIVE 03/09/2019 0357   LEUKOCYTESUR NEGATIVE 03/09/2019 0357   Sepsis Labs Invalid input(s): PROCALCITONIN,  WBC,  LACTICIDVEN Microbiology Recent Results (from the past 240 hour(s))  Respiratory Panel by RT PCR (Flu A&B, Covid) - Nasopharyngeal Swab     Status: None   Collection Time: 11/09/19 10:25 PM   Specimen: Nasopharyngeal Swab  Result Value Ref Range Status   SARS Coronavirus 2 by RT PCR NEGATIVE NEGATIVE Final    Comment: (NOTE) SARS-CoV-2 target nucleic acids are NOT DETECTED. The SARS-CoV-2 RNA is generally detectable in upper respiratoy specimens during the acute phase of infection. The lowest concentration of SARS-CoV-2 viral copies this assay can detect is 131 copies/mL. A negative result does not preclude SARS-Cov-2 infection and should not be used as the sole basis for treatment or other patient management decisions. A negative result may occur with  improper specimen collection/handling, submission of specimen other than nasopharyngeal swab, presence of viral mutation(s) within the areas targeted by this assay, and inadequate number of viral copies (<131 copies/mL). A negative result must be combined with clinical observations, patient history, and epidemiological information. The expected result is Negative. Fact Sheet for Patients:  https://www.moore.com/ Fact Sheet for Healthcare Providers:  https://www.young.biz/ This test is not yet ap proved or cleared by the [place] FDA and  has been authorized for detection and/or diagnosis of SARS-CoV-2 by FDA under an Emergency Use Authorization (EUA). This EUA  will remain  in effect (meaning this test can be used) for the duration of the COVID-19 declaration under Section 564(b)(1) of the Act, 21 U.S.C. section 360bbb-3(b)(1), unless the authorization is terminated or revoked sooner.    Influenza A by PCR NEGATIVE NEGATIVE Final   Influenza B by PCR NEGATIVE NEGATIVE Final    Comment: (NOTE) The Xpert Xpress SARS-CoV-2/FLU/RSV assay is intended as an aid in  the diagnosis of influenza from Nasopharyngeal swab specimens and  should not be used as a sole basis for treatment. Nasal washings and  aspirates are unacceptable for Xpert Xpress SARS-CoV-2/FLU/RSV  testing. Fact Sheet for Patients: https://www.moore.com/ Fact Sheet for Healthcare Providers: https://www.young.biz/ This test is not yet approved or cleared by the [place] FDA and  has been authorized for detection and/or diagnosis of SARS-CoV-2 by  FDA under an Emergency Use  Authorization (EUA). This EUA will remain  in effect (meaning this test can be used) for the duration of the  Covid-19 declaration under Section 564(b)(1) of the Act, 21  U.S.C. section 360bbb-3(b)(1), unless the authorization is  terminated or revoked. Performed at [facility], [address] [place], [place] [address]      Time coordinating discharge:  I have spent 35 minutes face to face with the patient and on the ward discussing the patients care, assessment, plan and disposition with other care givers. >50% of the time was devoted counseling the patient about the risks and benefits of treatment/Discharge disposition and coordinating care.   SIGNED:   Dimple Nanas, MD  [facility] 11/14/2019, 10:53 AM   If 7PM-7AM, please contact night-coverage

## 2019-11-23 LAB — METANEPHRINES, URINE, 24 HOUR
Metaneph Total, Ur: 86 ug/L
Metanephrines, 24H Ur: 387 ug/24 hr — ABNORMAL HIGH (ref 36–209)
Normetanephrine, 24H Ur: 756 ug/24 hr — ABNORMAL HIGH (ref 131–612)
Normetanephrine, Ur: 168 ug/L
Total Volume: 4500

## 2019-11-24 LAB — ALDOSTERONE + RENIN ACTIVITY W/ RATIO
ALDO / PRA Ratio: 5.8 (ref 0.0–30.0)
ALDO / PRA Ratio: 2.3 (ref 0.0–30.0)
Aldosterone: 16 ng/dL (ref 0.0–30.0)
Aldosterone: 19 ng/dL (ref 0.0–30.0)
PRA LC/MS/MS: 2.772 ng/mL/hr (ref 0.167–5.380)
PRA LC/MS/MS: 8.2 ng/mL/hr — ABNORMAL HIGH (ref 0.167–5.380)

## 2020-07-28 IMAGING — CT CT ANGIO CHEST
2 of 6 series · 18 of 36 positions shown · IV contrast (omnipaque)
Comparison: Chest radiograph of 1 day prior. No prior CT.

CLINICAL DATA: Shortness of breath. Chest pain for 3 days. Positive
D-dimer.

EXAM:
CT ANGIOGRAPHY CHEST WITH CONTRAST
TECHNIQUE: Multidetector CT imaging of the chest was performed using the
standard protocol during bolus administration of intravenous
contrast. Multiplanar CT image reconstructions and MIPs were
obtained to evaluate the vascular anatomy.
CONTRAST:  80mL OMNIPAQUE IOHEXOL 350 MG/ML SOLN

[Series 7: pe thins · axial · 0.88mm/px · z∈[+958,+1225]mm · 17 of 425 slices shown]
[im 22/425  lung]
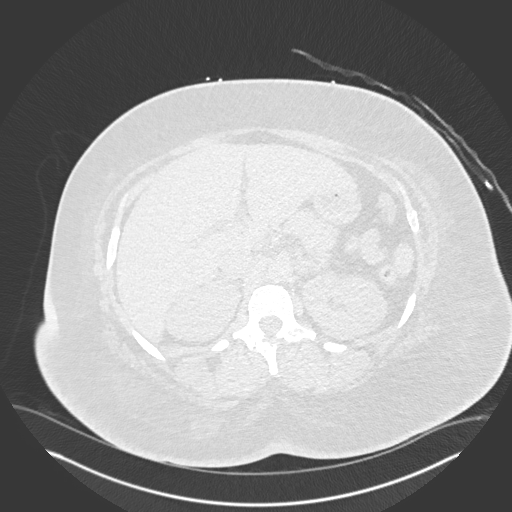
[im 43/425  mediastinal]
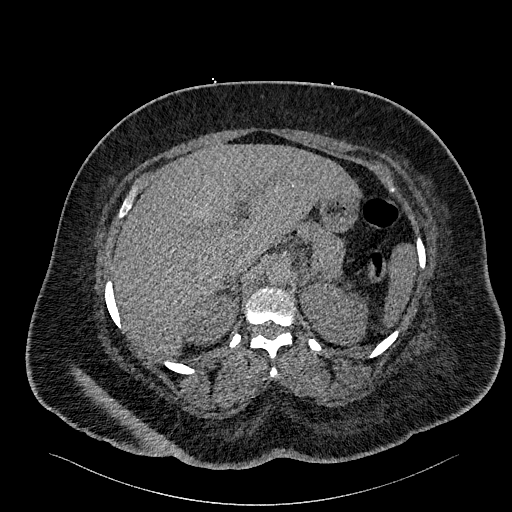
[im 64/425  lung]
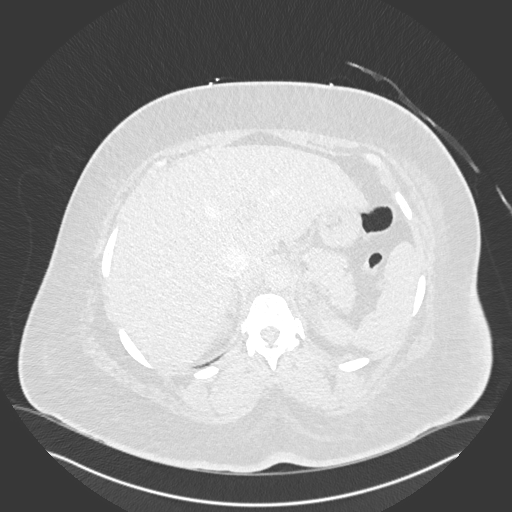
[im 85/425  mediastinal]
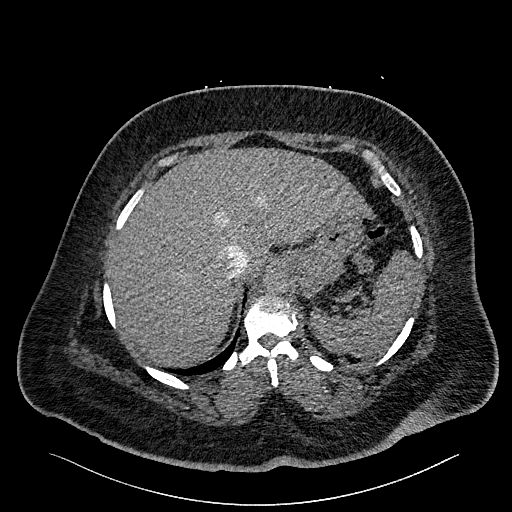
[im 128/425  lung]
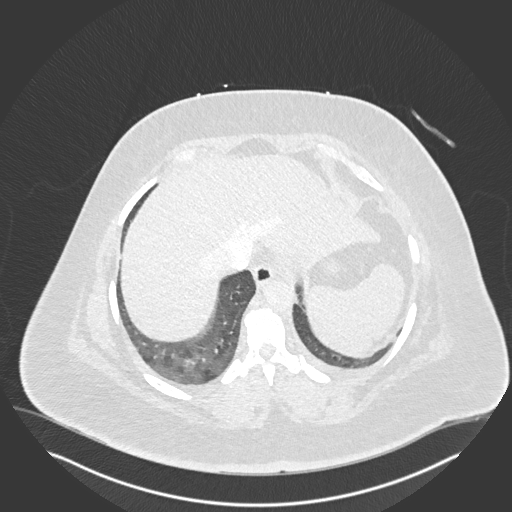
[im 149/425  mediastinal]
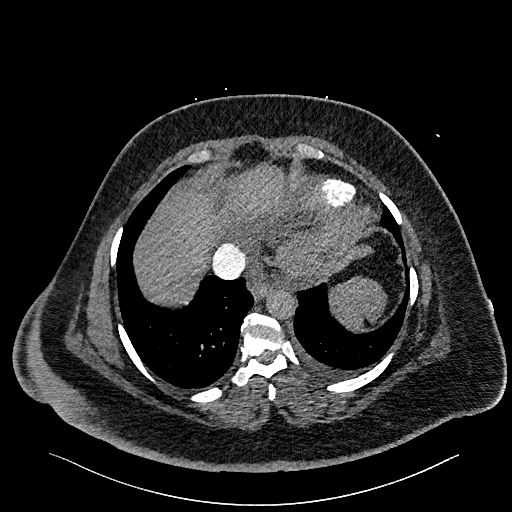
[im 170/425  lung]
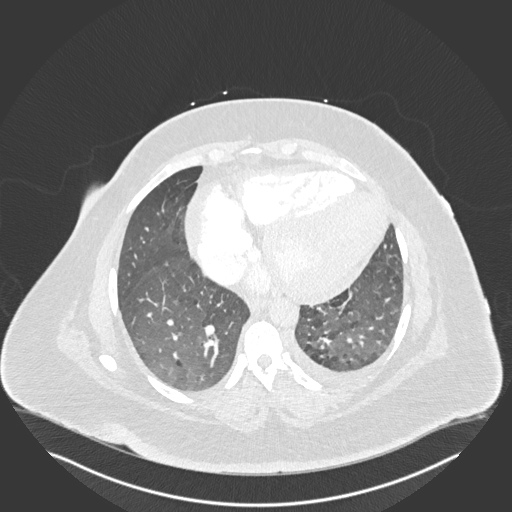
[im 191/425  mediastinal]
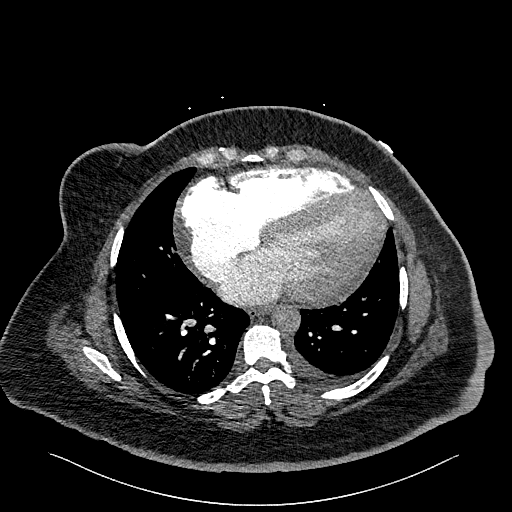
[im 213/425  lung]
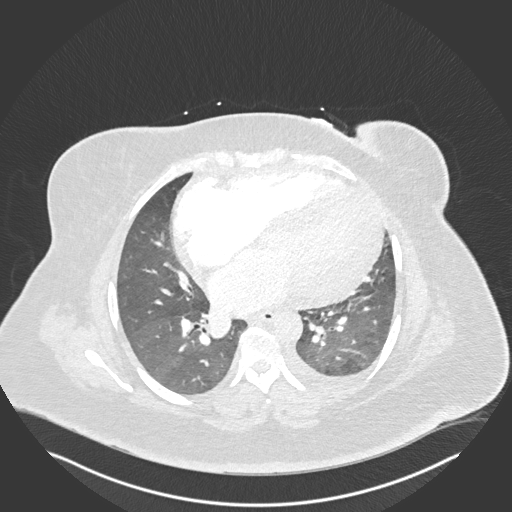
[im 234/425  mediastinal]
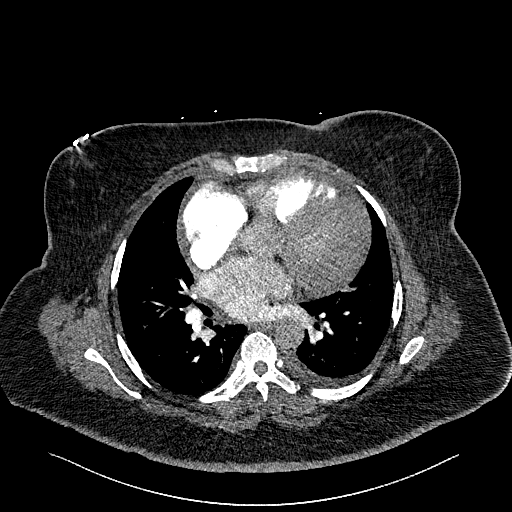
[im 255/425  lung]
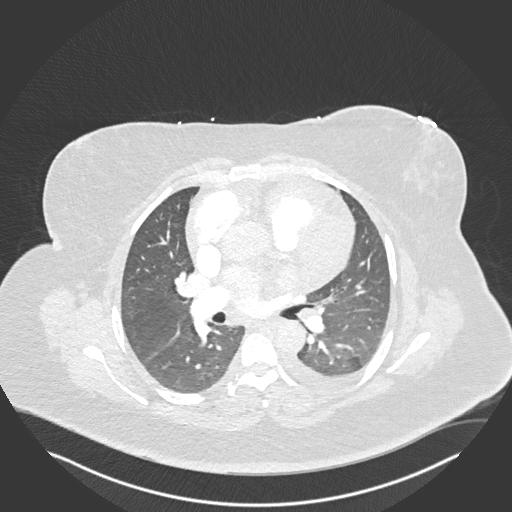
[im 276/425  mediastinal]
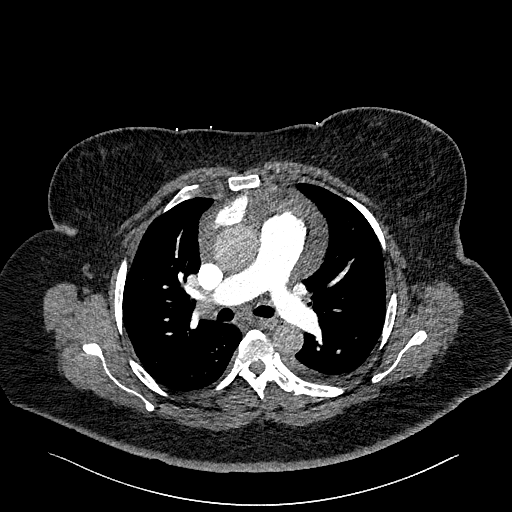
[im 297/425  lung]
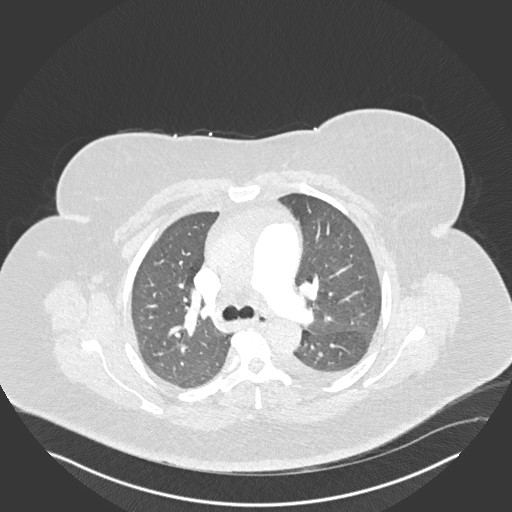
[im 340/425  mediastinal]
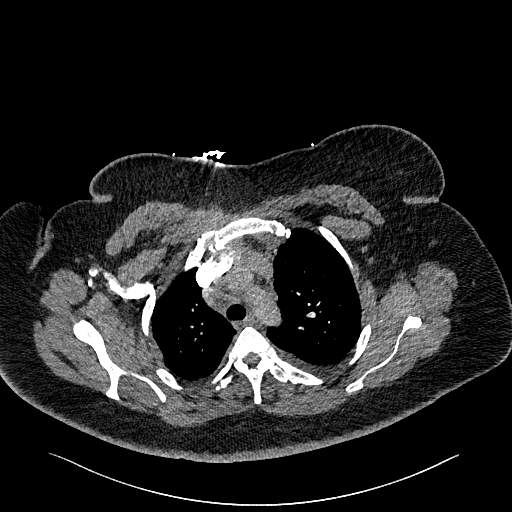
[im 361/425  lung]
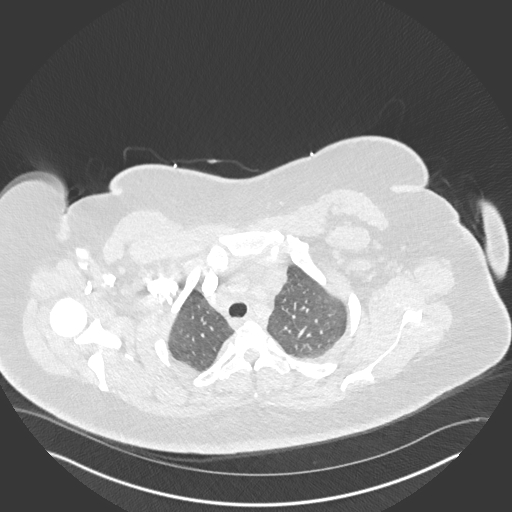
[im 382/425  mediastinal]
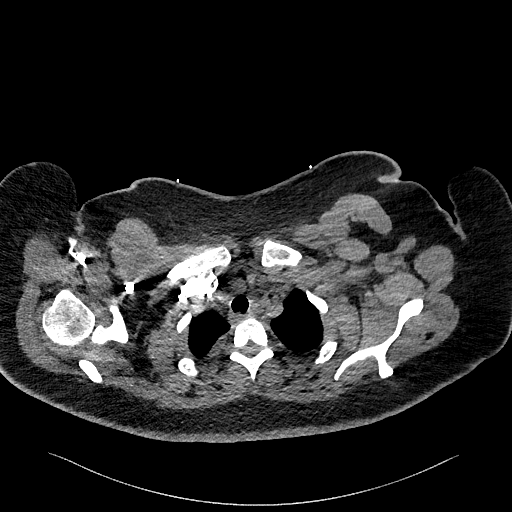
[im 403/425  lung]
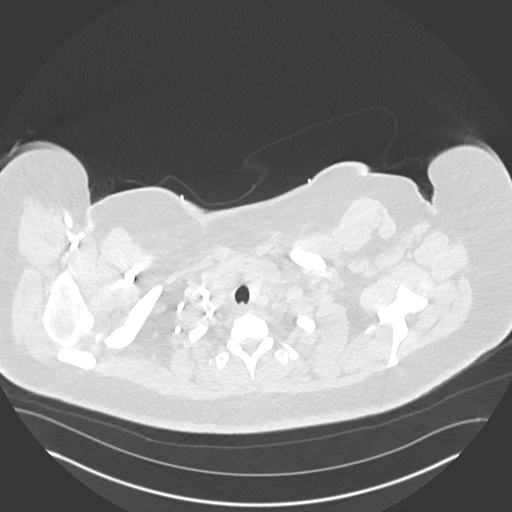

[Series 8: pe 2mm cor · coronal · 0.59mm/px · 1 of 185 slices shown]
[im 93/185  mediastinal]
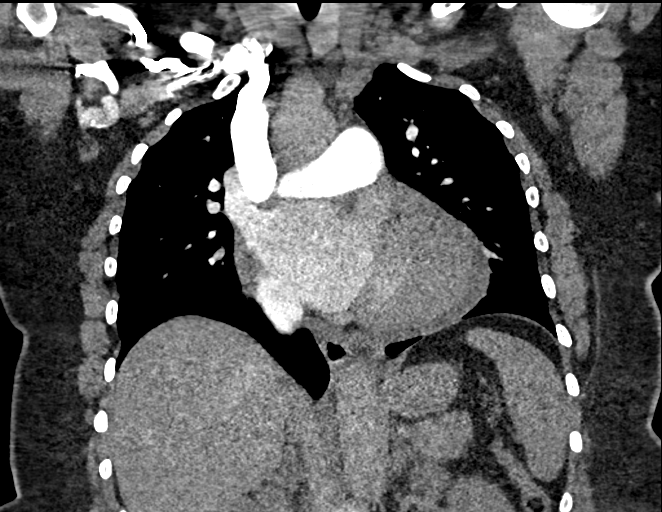

[18 of 36 positions shown; findings below may reference images not displayed]

FINDINGS: Cardiovascular: The quality of this exam for evaluation of pulmonary
embolism is moderate. The bolus is moderately well timed, with a
large amount of contrast remaining in the SVC. Degradation secondary
to patient size. To the upper lungs, no pulmonary embolism to the
large segmental level. No subsegmental embolism to the lower lungs.

Pulmonary artery enlargement, outflow tract 3.8 cm.

The aorta is grossly normal in caliber, but not well opacified.

Moderate cardiomegaly, with right sided chamber enlargement.
Contrast reflux in the IVC and hepatic veins.

Mediastinum/Nodes: No well-defined mediastinal adenopathy. Suspect
mediastinal edema, possibly related to fluid overload. No hilar
adenopathy.

Lungs/Pleura: Small left and trace right pleural fluid. Mosaic
attenuation, favored to be related to air trapping and
hypoventilation.

Upper Abdomen: Normal imaged portions of the liver, spleen, stomach,
pancreas, adrenal glands, kidneys.

Musculoskeletal: Moderate thoracic spondylosis.

Review of the MIP images confirms the above findings.
IMPRESSION: 1. Moderate quality exam for evaluation of pulmonary embolism. No
evidence of pulmonary embolism, with limitations above.
2. Pulmonary artery enlargement suggests pulmonary arterial
hypertension. Cardiomegaly with elevated right heart pressures.
3. Small left and trace right pleural fluid. Question fluid
overload.

## 2021-02-08 ENCOUNTER — Encounter (HOSPITAL_BASED_OUTPATIENT_CLINIC_OR_DEPARTMENT_OTHER): Payer: Self-pay

## 2021-02-08 ENCOUNTER — Other Ambulatory Visit: Payer: Self-pay

## 2021-02-08 ENCOUNTER — Emergency Department (HOSPITAL_BASED_OUTPATIENT_CLINIC_OR_DEPARTMENT_OTHER)
Admission: EM | Admit: 2021-02-08 | Discharge: 2021-02-08 | Disposition: A | Discharge status: Home / Self Care | Payer: Self-pay | Attending: Emergency Medicine | Admitting: Emergency Medicine

## 2021-02-08 ENCOUNTER — Emergency Department (HOSPITAL_BASED_OUTPATIENT_CLINIC_OR_DEPARTMENT_OTHER): Payer: Self-pay

## 2021-02-08 DIAGNOSIS — Z79899 Other long term (current) drug therapy: Secondary | ICD-10-CM | POA: Insufficient documentation

## 2021-02-08 DIAGNOSIS — I10 Essential (primary) hypertension: Secondary | ICD-10-CM | POA: Insufficient documentation

## 2021-02-08 DIAGNOSIS — R0602 Shortness of breath: Secondary | ICD-10-CM | POA: Insufficient documentation

## 2021-02-08 DIAGNOSIS — R0789 Other chest pain: Secondary | ICD-10-CM

## 2021-02-08 LAB — CBC
HCT: 42.6 % (ref 36.0–46.0)
Hemoglobin: 14.9 g/dL (ref 12.0–15.0)
MCH: 31.4 pg (ref 26.0–34.0)
MCHC: 35 g/dL (ref 30.0–36.0)
MCV: 89.7 fL (ref 80.0–100.0)
Platelets: 283 10*3/uL (ref 150–400)
RBC: 4.75 MIL/uL (ref 3.87–5.11)
RDW: 11.9 % (ref 11.5–15.5)
WBC: 11.4 10*3/uL — ABNORMAL HIGH (ref 4.0–10.5)
nRBC: 0 % (ref 0.0–0.2)

## 2021-02-08 LAB — MAGNESIUM: Magnesium: 2.1 mg/dL (ref 1.7–2.4)

## 2021-02-08 LAB — TROPONIN I (HIGH SENSITIVITY)
Troponin I (High Sensitivity): 23 ng/L — ABNORMAL HIGH (ref <18)
Troponin I (High Sensitivity): 19 ng/L — ABNORMAL HIGH (ref <18)

## 2021-02-08 LAB — BASIC METABOLIC PANEL
Anion gap: 10 (ref 5–15)
BUN: 12 mg/dL (ref 6–20)
CO2: 32 mmol/L (ref 22–32)
Calcium: 9.3 mg/dL (ref 8.9–10.3)
Chloride: 93 mmol/L — ABNORMAL LOW (ref 98–111)
Creatinine, Ser: 0.94 mg/dL (ref 0.44–1.00)
GFR, Estimated: >60 mL/min (ref >60)
Glucose, Bld: 96 mg/dL (ref 70–99)
Potassium: 2.6 mmol/L — CL (ref 3.5–5.1)
Sodium: 135 mmol/L (ref 135–145)

## 2021-02-08 LAB — PREGNANCY, URINE: Preg Test, Ur: NEGATIVE

## 2021-02-08 LAB — BRAIN NATRIURETIC PEPTIDE: B Natriuretic Peptide: 283.6 pg/mL — ABNORMAL HIGH (ref 0.0–100.0)

## 2021-02-08 MED ORDER — NITROGLYCERIN 0.4 MG SL SUBL
0.4000 mg | SUBLINGUAL_TABLET | SUBLINGUAL | Status: DC | PRN
  Administered 2021-02-08 (×2): 0.4 mg via SUBLINGUAL
  Filled 2021-02-08: qty 1

## 2021-02-08 MED ORDER — POTASSIUM CHLORIDE CRYS ER 20 MEQ PO TBCR
40.0000 meq | EXTENDED_RELEASE_TABLET | Freq: Once | ORAL | Status: AC
  Administered 2021-02-08: 40 meq via ORAL
  Filled 2021-02-08: qty 2

## 2021-02-08 MED ORDER — POTASSIUM CHLORIDE ER 10 MEQ PO TBCR: 20.0000 meq | EXTENDED_RELEASE_TABLET | Freq: Two times a day (BID) | ORAL | 0 refills | Status: DC

## 2021-02-08 MED ORDER — AMLODIPINE BESYLATE 10 MG PO TABS: 10.0000 mg | ORAL_TABLET | Freq: Every day | ORAL | 0 refills | Status: DC

## 2021-02-08 MED ORDER — HYDROCHLOROTHIAZIDE 25 MG PO TABS: 25.0000 mg | ORAL_TABLET | Freq: Every day | ORAL | 0 refills | Status: DC

## 2021-02-08 MED ORDER — HYDROCHLOROTHIAZIDE 25 MG PO TABS: 25.0000 mg | ORAL_TABLET | Freq: Once | ORAL | Status: DC

## 2021-02-08 MED ORDER — AMLODIPINE BESYLATE 5 MG PO TABS
10.0000 mg | ORAL_TABLET | Freq: Once | ORAL | Status: AC
  Administered 2021-02-08: 10 mg via ORAL
  Filled 2021-02-08: qty 2

## 2021-02-08 MED ORDER — LABETALOL HCL 5 MG/ML IV SOLN
10.0000 mg | Freq: Once | INTRAVENOUS | Status: AC
  Administered 2021-02-08: 10 mg via INTRAVENOUS
  Filled 2021-02-08: qty 4

## 2021-02-08 MED ORDER — ACETAMINOPHEN 500 MG PO TABS
1000.0000 mg | ORAL_TABLET | Freq: Once | ORAL | Status: AC
  Administered 2021-02-08: 1000 mg via ORAL
  Filled 2021-02-08: qty 2

## 2021-02-08 NOTE — ED Notes (Signed)
Pt amb to [facility]

## 2021-02-08 NOTE — ED Triage Notes (Signed)
Pt c/o intermittent CP x 2 days-denies fever/flu sx-NAD-steady gait

## 2021-02-08 NOTE — ED Notes (Signed)
EDP aware of pts HTN. Pt denies HA or chest pain. Pt comfortable with going home-explained the f/u process and importance of establishing PCP care to continue with BP medications.  Pt given rx for 3 medications-understands the importance of taking all 3.  Given information on how to find a PCP as well as a referral to the [facility].  Pt ambulatory out of the dept.

## 2021-02-08 NOTE — Discharge Instructions (Addendum)
I am starting you on 2 blood pressure medications 1 is called amlodipine you will take this once per day the other is called hydrochlorothiazide but also take this once per day.  Please monitor your symptoms if you start to have chest pain or shortness of breath please return to the ER.  Please follow-up with your primary care provider in 1 week to have your potassium as well as your electrolytes checked.  Please take the potassium supplements that I provided to you twice daily once the morning once in the evening.  As discussed, please return to the ER for new or concerning symptoms.  Do not need to take any additional blood pressure medications today but please start taking medications tomorrow morning.

## 2021-02-08 NOTE — ED Notes (Signed)
Pt refuses 3rd NTG; reports HA ans would like medication for same

## 2021-02-08 NOTE — ED Provider Notes (Signed)
[facility] EMERGENCY DEPARTMENT Provider Note   CSN: [id number] Arrival date & time: 02/08/21  1622     History Chief Complaint  Patient presents with   Chest Pain    Robin Jarvis is a 45 y.o. female.  HPI Patient is a 45 year old female with past medical history significant for hypertensive emergency 3/21 at which time she was admitted to the hospital had evidence of diastolic dysfunction with ventricular hypertrophy, had pulmonary edema, LVH on EKG and required Lasix diuresis in the hospital.  She is presented to the ER today with chest pain that is been ongoing for 2 days she states that it has been intermittent for the past 1 month but has been constant over the past 2 days she presented today to the ER with blood pressure 232/143.  She states that she has had more elevated blood pressures over the past month.  She states that she normally takes hydralazine, clonidine, amlodipine She takes amlodipine 10 mg daily, clonidine 0.2 mg 3 times daily, hydralazine 100 mg 3 times daily, hydrochlorothiazide 25 mg once daily.  She states that she stopped taking all of his medications except for the hydrochlorothiazide 1 month ago.  This is when her chest pain began.  She states that she is currently without insurance which is what caused her to not build to follow-up with her physician who normally cares for her and prescribed medications.  She denies any shortness of breath at this time but states that her chest pain is ongoing.  No nausea vomiting diarrhea fevers or chills.  No numbness weakness slurred speech confusion or headache.    Past Medical History:  Diagnosis Date   Herpes    Hypertension    Migraine     Patient Active Problem List   Diagnosis Date Noted   Dyspnea 11/10/2019   Chest pain 11/10/2019   Weight gain 11/10/2019   Cardiomegaly 11/10/2019   Elevated brain natriuretic peptide (BNP) level 11/10/2019   Hypokalemia 11/10/2019   Hypertensive emergency  11/09/2019    Past Surgical History:  Procedure Laterality Date   CESAREAN SECTION     TUBAL LIGATION       OB History   No obstetric history on file.     No family history on file.  Social History   Tobacco Use   Smoking status: Never   Smokeless tobacco: Never  Substance Use Topics   Alcohol use: Yes    Alcohol/week: 4.0 standard drinks    Types: 4 Glasses of wine per week    Comment: occ   Drug use: No    Home Medications Prior to Admission medications   Medication Sig Start Date End Date Taking? Authorizing Provider  amLODipine (NORVASC) 10 MG tablet Take 1 tablet (10 mg total) by mouth daily. 02/08/21 03/10/21 Yes Juriel Cid S, PA  hydrochlorothiazide (HYDRODIURIL) 25 MG tablet Take 1 tablet (25 mg total) by mouth daily. 02/08/21 03/10/21 Yes Jaylissa Felty S, PA  potassium chloride (KLOR-CON) 10 MEQ tablet Take 2 tablets (20 mEq total) by mouth 2 (two) times daily for 5 days. 02/08/21 02/13/21 Yes Giordan Fordham, Stevphen Meuse S, PA  acetaminophen (TYLENOL) 500 MG tablet Take 1,000 mg by mouth every 6 (six) hours as needed for mild pain.    [provider]  amLODipine (NORVASC) 10 MG tablet Take 1 tablet (10 mg total) by mouth daily. 11/15/19   Amin, Loura Halt, MD  cloNIDine (CATAPRES) 0.2 MG tablet Take 1 tablet (0.2 mg total) by mouth 3 (  three) times daily. 11/14/19 12/14/19  Amin, Loura Halt, MD  hydrALAZINE (APRESOLINE) 100 MG tablet Take 1 tablet (100 mg total) by mouth 3 (three) times daily. 11/14/19 12/14/19  Amin, Loura Halt, MD  hydrochlorothiazide (HYDRODIURIL) 25 MG tablet Take 25 mg by mouth daily. 12/28/20   [provider]  Multiple Vitamin (MULTIVITAMIN WITH MINERALS) TABS tablet Take 1 tablet by mouth daily.    [provider]  potassium chloride SA (KLOR-CON) 20 MEQ tablet Take 2 tablets (40 mEq total) by mouth 2 (two) times daily for 3 doses. 11/14/19 11/16/19  Dimple Nanas, MD    Allergies    Lisinopril  Review of Systems   Review of  Systems  Constitutional:  Negative for chills and fever.  HENT:  Negative for congestion.   Eyes:  Negative for pain.  Respiratory:  Positive for shortness of breath (None currently). Negative for cough.   Cardiovascular:  Positive for chest pain. Negative for leg swelling.  Gastrointestinal:  Negative for abdominal pain and vomiting.  Genitourinary:  Negative for dysuria.  Musculoskeletal:  Negative for myalgias.  Skin:  Negative for rash.  Neurological:  Negative for dizziness and headaches.   Physical Exam Updated Vital Signs BP (!) 221/117   Pulse (!) 58   Temp 98.9 F (37.2 C) (Oral)   Resp 17   Ht 5\' 9"  (1.753 m)   Wt 111.1 kg   LMP 01/18/2021   SpO2 99%   BMI 36.18 kg/m   Physical Exam Vitals and nursing note reviewed.  Constitutional:      General: She is not in acute distress.    Appearance: She is obese.     Comments: 45 year old female no acute distress sitting comfortably in bed.  Somewhat anxious appearing.  HENT:     Head: Normocephalic and atraumatic.     Nose: Nose normal.  Eyes:     General: No scleral icterus. Cardiovascular:     Rate and Rhythm: Normal rate and regular rhythm.     Pulses: Normal pulses.     Heart sounds: Normal heart sounds.  Pulmonary:     Effort: Pulmonary effort is normal. No respiratory distress.     Breath sounds: No wheezing.  Abdominal:     Palpations: Abdomen is soft.     Tenderness: There is no abdominal tenderness.  Musculoskeletal:     Cervical back: Normal range of motion.     Right lower leg: No edema.     Left lower leg: No edema.     Comments: No lower extremity edema  Skin:    General: Skin is warm and dry.     Capillary Refill: Capillary refill takes less than 2 seconds.  Neurological:     Mental Status: She is alert. Mental status is at baseline.     Comments: Alert and oriented to self, place, time and event.   Speech is fluent, clear without dysarthria or dysphasia.   Strength 5/5 in upper/lower  extremities   Sensation intact in upper/lower extremities   Normal gait.  Normal finger-to-nose and feet tapping.  CN I not tested  CN II grossly intact visual fields bilaterally. Did not visualize posterior eye.  CN III, IV, VI PERRLA and EOMs intact bilaterally  CN V Intact sensation to sharp and light touch to the face  CN VII facial movements symmetric  CN VIII not tested  CN IX, X no uvula deviation, symmetric rise of soft palate  CN XI 5/5 SCM and trapezius strength  bilaterally  CN XII Midline tongue protrusion, symmetric L/R movements    Psychiatric:        Mood and Affect: Mood normal.        Behavior: Behavior normal.    ED Results / Procedures / Treatments   Labs (all labs ordered are listed, but only abnormal results are displayed) Labs Reviewed  BASIC METABOLIC PANEL - Abnormal; Notable for the following components:      Result Value   Potassium 2.6 (*)    Chloride 93 (*)    All other components within normal limits  CBC - Abnormal; Notable for the following components:   WBC 11.4 (*)    All other components within normal limits  BRAIN NATRIURETIC PEPTIDE - Abnormal; Notable for the following components:   B Natriuretic Peptide 283.6 (*)    All other components within normal limits  TROPONIN I (HIGH SENSITIVITY) - Abnormal; Notable for the following components:   Troponin I (High Sensitivity) 23 (*)    All other components within normal limits  TROPONIN I (HIGH SENSITIVITY) - Abnormal; Notable for the following components:   Troponin I (High Sensitivity) 19 (*)    All other components within normal limits  PREGNANCY, URINE  MAGNESIUM    EKG EKG Interpretation  Date/Time:  Wednesday February 08 2021 16:33:24 EDT Ventricular Rate:  80 PR Interval:  150 QRS Duration: 100 QT Interval:  436 QTC Calculation: 502 R Axis:   -63 Text Interpretation: Sinus rhythm with occasional Premature ventricular complexes and Premature atrial complexes Left anterior  fascicular block Left ventricular hypertrophy with repolarization abnormality ( Cornell product ) Prolonged QT Abnormal ECG Confirmed by Tilden Fossaees, Elizabeth [phone]([id number]) on 02/08/2021 4:37:08 PM  Radiology DG Chest 2 View  Result Date: 02/08/2021 CLINICAL DATA:  45 year old female with chest pain. EXAM: CHEST - 2 VIEW COMPARISON:  Chest radiograph dated 11/09/2019 FINDINGS: The lungs are clear. There is no pleural effusion pneumothorax. Top-normal cardiac size. No acute osseous pathology. IMPRESSION: No active cardiopulmonary disease. Electronically Signed   By: Elgie CollardArash  Radparvar M.D.   On: 02/08/2021 16:58    Procedures .Critical Care  Date/Time: 02/09/2021 12:44 AM Performed by: Gailen ShelterFondaw, Charlestine Rookstool S, PA Authorized by: Gailen ShelterFondaw, Candies Palm S, PA   Critical care provider statement:    Critical care time (minutes):  35   Critical care time was exclusive of:  Separately billable procedures and treating other patients and teaching time   Critical care was time spent personally by me on the following activities:  Discussions with consultants, evaluation of patient's response to treatment, examination of patient, review of old charts, re-evaluation of patient's condition, pulse oximetry, ordering and review of radiographic studies, ordering and review of laboratory studies and ordering and performing treatments and interventions   I assumed direction of critical care for this patient from another provider in my specialty: no   Comments:     Patient with hypertensive emergency with systolic blood pressure greater than 230 and use of intravenous IV meds.  Also with chest pain qualifying patient for critical care.   Medications Ordered in ED Medications  nitroGLYCERIN (NITROSTAT) SL tablet 0.4 mg (0.4 mg Sublingual Given 02/08/21 1753)  acetaminophen (TYLENOL) tablet 1,000 mg (1,000 mg Oral Given 02/08/21 1806)  potassium chloride SA (KLOR-CON) CR tablet 40 mEq (40 mEq Oral Given 02/08/21 1806)  amLODipine (NORVASC)  tablet 10 mg (10 mg Oral Given 02/08/21 1825)  labetalol (NORMODYNE) injection 10 mg (10 mg Intravenous Given 02/08/21 1826)    ED Course  I have reviewed the triage vital signs and the nursing notes.  Pertinent labs & imaging results that were available during my care of the patient were reviewed by me and considered in my medical decision making (see chart for details).  Clinical Course as of 02/09/21 0040  Wed Feb 08, 2021  1819 Discussed with Dr. Madilyn Hook about which anti-hypertensive to use -- will give amlodipine / labetalol  [WF]  2003 BP(!): 193/102 Goal was 20% reduction which was achieved.  [WF]    Clinical Course User Index [WF] Gailen Shelter, [place]   MDM Rules/Calculators/A&P                          Patient is a 45 year old female with a past medical history notable for severe hypertension with hypertensive emergency if she is required admission to the hospital and aggressive treatment in the past.  She presented to the ER today with continued use of hydrochlorothiazide but no other medications for her blood pressure pressures been significantly elevated here in the ER initially 252/143.  Goal blood pressure reduction is 20% which is a systolic blood pressure of 200.  Patient initially given 2 doses of sublingual nitroglycerin which completely improved her chest pain she has no longer having any symptoms.  However her blood pressure continues to be elevated.  Provide patient with a dose of amlodipine as well as 1 IV dose of labetalol.  Patient has a potassium of 2.6 likely secondary to hydrochlorothiazide use.  Electrolytes otherwise normal.  CBC without significant leukocytosis and no anemia.  Magnesium within normal limits.  Urine pregnancy test is negative.  Troponin x2 without a second delta very mildly elevated likely due to patient's longstanding hypertension perhaps chronically elevated for her.  BNP mildly elevated at 283 but not consistent with CHF patient does not appear  fluid overloaded.  Chest x-ray unremarkable no pulmonary edema.  EKG notable for LVH consistent with patient's history of longstanding and poorly treated hypertension.  Offered patient admission given her presentation today which she declined.  She preferred to be discharged home will start on amlodipine and continue her hydrochlorothiazide we will also give her 20 mEq of potassium 2 times daily for the next 5 days and have her get her potassium rechecked in 7 days by PCP if she is unable to follow-up she is recommended to recheck in the ER.  Patient ultimately is reasonable discharge home but was given very strict return precautions because of concern for hypertensive emergency in the chest pain shortness of breath should prompt her to return to the ER.  She is agreeable to this plan.  I discussed this case with my attending physician who cosigned this note including patient's presenting symptoms, physical exam, and planned diagnostics and interventions. Attending physician stated agreement with plan or made changes to plan which were implemented.   Attending physician assessed patient at bedside.   Patient able to teach back discharge instructions prior to discharge.  Feels much improved at this time.  She did develop a mild headache after nitroglycerin was treated with Tylenol.  She remains neurologically intact on exam prior to discharge.  All questions answered best my ability.  Final Clinical Impression(s) / ED Diagnoses Final diagnoses:  Hypertension, unspecified type  Atypical chest pain    Rx / DC Orders ED Discharge Orders          Ordered    hydrochlorothiazide (HYDRODIURIL) 25 MG tablet  Daily  02/08/21 2101    amLODipine (NORVASC) 10 MG tablet  Daily        02/08/21 2101    potassium chloride (KLOR-CON) 10 MEQ tablet  2 times daily        02/08/21 2103             Solon Augusta Oreland, [place] 02/09/21 Marcie Bal    Tilden Fossa, MD 02/13/21 [phone]

## 2022-01-25 ENCOUNTER — Inpatient Hospital Stay (HOSPITAL_BASED_OUTPATIENT_CLINIC_OR_DEPARTMENT_OTHER)
Admission: EM | Admit: 2022-01-25 | Discharge: 2022-02-02 | DRG: 305 | Disposition: A | Discharge status: Home / Self Care | Payer: Self-pay | Attending: Internal Medicine | Admitting: Internal Medicine

## 2022-01-25 ENCOUNTER — Encounter (HOSPITAL_BASED_OUTPATIENT_CLINIC_OR_DEPARTMENT_OTHER): Payer: Self-pay

## 2022-01-25 ENCOUNTER — Other Ambulatory Visit: Payer: Self-pay

## 2022-01-25 ENCOUNTER — Emergency Department (HOSPITAL_BASED_OUTPATIENT_CLINIC_OR_DEPARTMENT_OTHER): Payer: Self-pay

## 2022-01-25 ENCOUNTER — Inpatient Hospital Stay (HOSPITAL_COMMUNITY): Payer: Self-pay

## 2022-01-25 DIAGNOSIS — I420 Dilated cardiomyopathy: Secondary | ICD-10-CM

## 2022-01-25 DIAGNOSIS — Z888 Allergy status to other drugs, medicaments and biological substances status: Secondary | ICD-10-CM

## 2022-01-25 DIAGNOSIS — R7989 Other specified abnormal findings of blood chemistry: Secondary | ICD-10-CM

## 2022-01-25 DIAGNOSIS — E876 Hypokalemia: Secondary | ICD-10-CM | POA: Diagnosis present

## 2022-01-25 DIAGNOSIS — I130 Hypertensive heart and chronic kidney disease with heart failure and stage 1 through stage 4 chronic kidney disease, or unspecified chronic kidney disease: Secondary | ICD-10-CM | POA: Diagnosis present

## 2022-01-25 DIAGNOSIS — I272 Pulmonary hypertension, unspecified: Secondary | ICD-10-CM | POA: Diagnosis present

## 2022-01-25 DIAGNOSIS — I358 Other nonrheumatic aortic valve disorders: Secondary | ICD-10-CM | POA: Diagnosis present

## 2022-01-25 DIAGNOSIS — R778 Other specified abnormalities of plasma proteins: Secondary | ICD-10-CM

## 2022-01-25 DIAGNOSIS — I1A0 Resistant hypertension: Secondary | ICD-10-CM

## 2022-01-25 DIAGNOSIS — I3139 Other pericardial effusion (noninflammatory): Secondary | ICD-10-CM | POA: Diagnosis present

## 2022-01-25 DIAGNOSIS — Z91199 Patient's noncompliance with other medical treatment and regimen due to unspecified reason: Secondary | ICD-10-CM

## 2022-01-25 DIAGNOSIS — N179 Acute kidney failure, unspecified: Secondary | ICD-10-CM | POA: Diagnosis not present

## 2022-01-25 DIAGNOSIS — I248 Other forms of acute ischemic heart disease: Secondary | ICD-10-CM | POA: Diagnosis present

## 2022-01-25 DIAGNOSIS — Z9851 Tubal ligation status: Secondary | ICD-10-CM

## 2022-01-25 DIAGNOSIS — Z713 Dietary counseling and surveillance: Secondary | ICD-10-CM

## 2022-01-25 DIAGNOSIS — Z79899 Other long term (current) drug therapy: Secondary | ICD-10-CM

## 2022-01-25 DIAGNOSIS — N182 Chronic kidney disease, stage 2 (mild): Secondary | ICD-10-CM | POA: Diagnosis present

## 2022-01-25 DIAGNOSIS — I5042 Chronic combined systolic (congestive) and diastolic (congestive) heart failure: Secondary | ICD-10-CM | POA: Diagnosis present

## 2022-01-25 DIAGNOSIS — Z8249 Family history of ischemic heart disease and other diseases of the circulatory system: Secondary | ICD-10-CM

## 2022-01-25 DIAGNOSIS — I161 Hypertensive emergency: Principal | ICD-10-CM | POA: Diagnosis present

## 2022-01-25 DIAGNOSIS — I10 Essential (primary) hypertension: Secondary | ICD-10-CM

## 2022-01-25 DIAGNOSIS — I472 Ventricular tachycardia, unspecified: Secondary | ICD-10-CM | POA: Diagnosis not present

## 2022-01-25 DIAGNOSIS — I43 Cardiomyopathy in diseases classified elsewhere: Secondary | ICD-10-CM | POA: Diagnosis present

## 2022-01-25 DIAGNOSIS — Z91148 Patient's other noncompliance with medication regimen for other reason: Secondary | ICD-10-CM

## 2022-01-25 DIAGNOSIS — Z6835 Body mass index (BMI) 35.0-35.9, adult: Secondary | ICD-10-CM

## 2022-01-25 LAB — RAPID URINE DRUG SCREEN, HOSP PERFORMED
Amphetamines: NOT DETECTED
Barbiturates: NOT DETECTED
Benzodiazepines: NOT DETECTED
Cocaine: NOT DETECTED
Opiates: NOT DETECTED
Tetrahydrocannabinol: NOT DETECTED

## 2022-01-25 LAB — CBC
HCT: 42.2 % (ref 36.0–46.0)
Hemoglobin: 14.4 g/dL (ref 12.0–15.0)
MCH: 31 pg (ref 26.0–34.0)
MCHC: 34.1 g/dL (ref 30.0–36.0)
MCV: 90.9 fL (ref 80.0–100.0)
Platelets: 251 10*3/uL (ref 150–400)
RBC: 4.64 MIL/uL (ref 3.87–5.11)
RDW: 14.3 % (ref 11.5–15.5)
WBC: 12 10*3/uL — ABNORMAL HIGH (ref 4.0–10.5)
nRBC: 0 % (ref 0.0–0.2)

## 2022-01-25 LAB — URINALYSIS, ROUTINE W REFLEX MICROSCOPIC
Bacteria, UA: NONE SEEN
Bilirubin Urine: NEGATIVE
Glucose, UA: NEGATIVE mg/dL
Hgb urine dipstick: NEGATIVE
Ketones, ur: NEGATIVE mg/dL
Leukocytes,Ua: NEGATIVE
Nitrite: NEGATIVE
Protein, ur: 100 mg/dL — AB
Specific Gravity, Urine: 1.016 (ref 1.005–1.030)
pH: 7 (ref 5.0–8.0)

## 2022-01-25 LAB — BASIC METABOLIC PANEL
Anion gap: 11 (ref 5–15)
Anion gap: 14 (ref 5–15)
BUN: 16 mg/dL (ref 6–20)
BUN: 12 mg/dL (ref 6–20)
CO2: 27 mmol/L (ref 22–32)
CO2: 26 mmol/L (ref 22–32)
Calcium: 9.1 mg/dL (ref 8.9–10.3)
Calcium: 8.9 mg/dL (ref 8.9–10.3)
Chloride: 99 mmol/L (ref 98–111)
Chloride: 98 mmol/L (ref 98–111)
Creatinine, Ser: 1.16 mg/dL — ABNORMAL HIGH (ref 0.44–1.00)
Creatinine, Ser: 1.18 mg/dL — ABNORMAL HIGH (ref 0.44–1.00)
GFR, Estimated: 60 mL/min — ABNORMAL LOW (ref >60)
GFR, Estimated: 59 mL/min — ABNORMAL LOW (ref >60)
Glucose, Bld: 118 mg/dL — ABNORMAL HIGH (ref 70–99)
Glucose, Bld: 128 mg/dL — ABNORMAL HIGH (ref 70–99)
Potassium: 2.8 mmol/L — ABNORMAL LOW (ref 3.5–5.1)
Potassium: 3 mmol/L — ABNORMAL LOW (ref 3.5–5.1)
Sodium: 137 mmol/L (ref 135–145)
Sodium: 138 mmol/L (ref 135–145)

## 2022-01-25 LAB — BRAIN NATRIURETIC PEPTIDE: B Natriuretic Peptide: 1003.2 pg/mL — ABNORMAL HIGH (ref 0.0–100.0)

## 2022-01-25 LAB — TROPONIN I (HIGH SENSITIVITY)
Troponin I (High Sensitivity): 36 ng/L — ABNORMAL HIGH (ref <18)
Troponin I (High Sensitivity): 34 ng/L — ABNORMAL HIGH (ref <18)

## 2022-01-25 LAB — PREGNANCY, URINE: Preg Test, Ur: NEGATIVE

## 2022-01-25 LAB — HIV ANTIBODY (ROUTINE TESTING W REFLEX): HIV Screen 4th Generation wRfx: NONREACTIVE

## 2022-01-25 LAB — LIPID PANEL
Cholesterol: 151 mg/dL (ref 0–200)
HDL: 36 mg/dL — ABNORMAL LOW (ref >40)
LDL Cholesterol: 100 mg/dL — ABNORMAL HIGH (ref 0–99)
Total CHOL/HDL Ratio: 4.2 RATIO
Triglycerides: 75 mg/dL (ref <150)
VLDL: 15 mg/dL (ref 0–40)

## 2022-01-25 LAB — GLUCOSE, CAPILLARY: Glucose-Capillary: 146 mg/dL — ABNORMAL HIGH (ref 70–99)

## 2022-01-25 LAB — HEMOGLOBIN A1C
Hgb A1c MFr Bld: 5.5 % (ref 4.8–5.6)
Mean Plasma Glucose: 111.15 mg/dL

## 2022-01-25 MED ORDER — LABETALOL HCL 5 MG/ML IV SOLN
20.0000 mg | INTRAVENOUS | Status: DC | PRN
Start: 2022-01-25 — End: 2022-01-27

## 2022-01-25 MED ORDER — POTASSIUM CHLORIDE CRYS ER 20 MEQ PO TBCR
40.0000 meq | EXTENDED_RELEASE_TABLET | Freq: Once | ORAL | Status: AC
  Administered 2022-01-25: 40 meq via ORAL
  Filled 2022-01-25: qty 2

## 2022-01-25 MED ORDER — DOCUSATE SODIUM 100 MG PO CAPS
100.0000 mg | ORAL_CAPSULE | Freq: Two times a day (BID) | ORAL | Status: DC | PRN
  Administered 2022-01-27 (×2): 100 mg via ORAL
  Filled 2022-01-25 (×2): qty 1

## 2022-01-25 MED ORDER — POTASSIUM CHLORIDE 10 MEQ/50ML IV SOLN: 10.0000 meq | INTRAVENOUS | Status: DC

## 2022-01-25 MED ORDER — HYDRALAZINE HCL 25 MG PO TABS
50.0000 mg | ORAL_TABLET | Freq: Once | ORAL | Status: AC
  Administered 2022-01-25: 50 mg via ORAL
  Filled 2022-01-25: qty 2

## 2022-01-25 MED ORDER — HYDRALAZINE HCL 50 MG PO TABS
50.0000 mg | ORAL_TABLET | Freq: Once | ORAL | Status: AC
Start: 2022-01-25 — End: 2022-01-25
  Administered 2022-01-25: 50 mg via ORAL
  Filled 2022-01-25: qty 1

## 2022-01-25 MED ORDER — ENOXAPARIN SODIUM 40 MG/0.4ML IJ SOSY: 40.0000 mg | PREFILLED_SYRINGE | INTRAMUSCULAR | Status: DC

## 2022-01-25 MED ORDER — HEPARIN SODIUM (PORCINE) 5000 UNIT/ML IJ SOLN
5000.0000 [IU] | Freq: Three times a day (TID) | INTRAMUSCULAR | Status: DC
  Administered 2022-01-25 – 2022-01-29 (×11): 5000 [IU] via SUBCUTANEOUS
  Filled 2022-01-25 (×11): qty 1

## 2022-01-25 MED ORDER — CLEVIDIPINE BUTYRATE 0.5 MG/ML IV EMUL: 0.0000 mg/h | INTRAVENOUS | Status: DC

## 2022-01-25 MED ORDER — AMLODIPINE BESYLATE 10 MG PO TABS
10.0000 mg | ORAL_TABLET | Freq: Every day | ORAL | Status: DC
  Administered 2022-01-26 – 2022-02-02 (×8): 10 mg via ORAL
  Filled 2022-01-25 (×8): qty 1

## 2022-01-25 MED ORDER — POTASSIUM CHLORIDE CRYS ER 20 MEQ PO TBCR
20.0000 meq | EXTENDED_RELEASE_TABLET | ORAL | Status: AC
  Administered 2022-01-25 (×2): 20 meq via ORAL
  Filled 2022-01-25 (×2): qty 1

## 2022-01-25 MED ORDER — NITROGLYCERIN 2 % TD OINT
1.0000 [in_us] | TOPICAL_OINTMENT | Freq: Once | TRANSDERMAL | Status: AC
  Administered 2022-01-25: 1 [in_us] via TOPICAL
  Filled 2022-01-25: qty 1

## 2022-01-25 MED ORDER — POTASSIUM CHLORIDE 10 MEQ/100ML IV SOLN
10.0000 meq | INTRAVENOUS | Status: AC
  Administered 2022-01-25 (×4): 10 meq via INTRAVENOUS
  Filled 2022-01-25 (×4): qty 100

## 2022-01-25 MED ORDER — POTASSIUM CHLORIDE 10 MEQ/100ML IV SOLN
10.0000 meq | INTRAVENOUS | Status: DC
  Administered 2022-01-25 (×2): 10 meq via INTRAVENOUS
  Filled 2022-01-25 (×3): qty 100

## 2022-01-25 MED ORDER — ONDANSETRON HCL 4 MG/2ML IJ SOLN
4.0000 mg | Freq: Four times a day (QID) | INTRAMUSCULAR | Status: DC | PRN
  Administered 2022-01-25: 4 mg via INTRAVENOUS
  Filled 2022-01-25: qty 2

## 2022-01-25 MED ORDER — HYDRALAZINE HCL 20 MG/ML IJ SOLN
10.0000 mg | INTRAMUSCULAR | Status: DC | PRN
  Administered 2022-01-25 – 2022-01-30 (×4): 10 mg via INTRAVENOUS
  Filled 2022-01-25 (×5): qty 1

## 2022-01-25 MED ORDER — LABETALOL HCL 5 MG/ML IV SOLN
40.0000 mg | INTRAVENOUS | Status: DC | PRN
  Administered 2022-01-25: 40 mg via INTRAVENOUS
  Filled 2022-01-25 (×2): qty 8

## 2022-01-25 MED ORDER — WHITE PETROLATUM EX OINT
1.0000 | TOPICAL_OINTMENT | CUTANEOUS | Status: DC | PRN
Start: 2022-01-25 — End: 2022-02-02
  Administered 2022-01-25: 1 via TOPICAL
  Filled 2022-01-25: qty 28.35

## 2022-01-25 MED ORDER — CLEVIDIPINE BUTYRATE 0.5 MG/ML IV EMUL
0.0000 mg/h | INTRAVENOUS | Status: DC
  Filled 2022-01-25: qty 100

## 2022-01-25 MED ORDER — CLEVIDIPINE BUTYRATE 0.5 MG/ML IV EMUL
0.0000 mg/h | INTRAVENOUS | Status: DC
  Filled 2022-01-25: qty 50

## 2022-01-25 MED ORDER — IOHEXOL 350 MG/ML SOLN
100.0000 mL | Freq: Once | INTRAVENOUS | Status: AC | PRN
  Administered 2022-01-25: 100 mL via INTRAVENOUS

## 2022-01-25 MED ORDER — AMLODIPINE BESYLATE 5 MG PO TABS
10.0000 mg | ORAL_TABLET | Freq: Once | ORAL | Status: AC
  Administered 2022-01-25: 10 mg via ORAL
  Filled 2022-01-25: qty 2

## 2022-01-25 MED ORDER — HYDRALAZINE HCL 50 MG PO TABS
100.0000 mg | ORAL_TABLET | Freq: Three times a day (TID) | ORAL | Status: DC
  Administered 2022-01-25 – 2022-01-26 (×2): 100 mg via ORAL
  Filled 2022-01-25 (×2): qty 2

## 2022-01-25 MED ORDER — POLYETHYLENE GLYCOL 3350 17 G PO PACK: 17.0000 g | PACK | Freq: Every day | ORAL | Status: DC | PRN

## 2022-01-25 MED ORDER — LABETALOL HCL 200 MG PO TABS
200.0000 mg | ORAL_TABLET | Freq: Three times a day (TID) | ORAL | Status: DC
  Administered 2022-01-25 – 2022-01-26 (×3): 200 mg via ORAL
  Filled 2022-01-25 (×3): qty 1

## 2022-01-25 MED ORDER — LABETALOL HCL 5 MG/ML IV SOLN
20.0000 mg | Freq: Once | INTRAVENOUS | Status: AC
  Administered 2022-01-25: 20 mg via INTRAVENOUS
  Filled 2022-01-25: qty 4

## 2022-01-25 MED ORDER — NITROGLYCERIN IN D5W 200-5 MCG/ML-% IV SOLN: 0.0000 ug/min | INTRAVENOUS | Status: DC

## 2022-01-25 MED ORDER — ACETAMINOPHEN 325 MG PO TABS
650.0000 mg | ORAL_TABLET | Freq: Four times a day (QID) | ORAL | Status: DC | PRN
  Administered 2022-01-25 – 2022-01-26 (×2): 650 mg via ORAL
  Filled 2022-01-25 (×2): qty 2

## 2022-01-25 NOTE — Progress Notes (Signed)
eLink Physician-Brief Progress Note Patient Name: Robin Jarvis DOB: 1976-01-06 MRN: [id number]   Date of Service  01/25/2022  HPI/Events of Note  K+ 3.0  eICU Interventions  K+ replaced per E-Link electrolyte replacement protocol.        Migdalia Dk 01/25/2022, 7:36 PM

## 2022-01-25 NOTE — ED Notes (Signed)
Report to [facility] transport 

## 2022-01-25 NOTE — H&P (Signed)
NAME:  Robin Jarvis, MRN:  [id number], DOB:  04/25/1976, LOS: 0 ADMISSION DATE:  01/25/2022, CONSULTATION DATE:  01/25/2022 REFERRING MD: Dr Rhunette Croft, CHIEF COMPLAINT:  HTN Emergency  History of Present Illness:  46 year old female with PMH significant for uncontrolled HTN. Presents to [facility] on 6/15 with reported 1 week of chest pain, headache, and nausea. Reports not taking hypertensive medications since last June due to lack of health insurance. On arrival to ED BP 230/127. Crt 1.16, K 2.8, BNP 1003. Troponin 36 > 34. EKG with LVH. Given Labetalol 60 mg IV total, Norvasc 10 mg, Hydralazine 50 mg tab, nitro patch, Potassium 40 meq, followed by 10 meq x 3. Decision made to transfer to [facility] for further evaluation.   On arrival to unit patient with 6/10 chest pain. BP 182/109.   Pertinent  Medical History  HTN, Cardiomegaly, Grade 1 DD previous EF 50-55, Mild renal insuffiencey   Significant Hospital Events: Including procedures, antibiotic start and stop dates in addition to other pertinent events   6/15 Presents to ED   Interim History / Subjective:  As above.   Objective   Blood pressure (!) 190/125, pulse 70, temperature 98.5 F (36.9 C), temperature source Oral, resp. rate 19, height 5\' 9"  (1.753 m), weight 108.9 kg, last menstrual period 12/22/2021, SpO2 97 %.       No intake or output data in the 24 hours ending 01/25/22 1333 Filed Weights   01/25/22 0945  Weight: 108.9 kg    Examination: General: Adult female, no distress noted  HENT: MMM Lungs: Clear breath sounds, no use of accessory muscles  Cardiovascular: RRR HR 67, no mRG Abdomen: Obese, non-tender, non-distended, active bowel sounds  Extremities: -edema  Neuro: alert, oriented, follows commands  GU: intact   Resolved Hospital Problem list     Assessment & Plan:   HTN Emergency  Chest Pain   H/O Grade 1 DD, Cardiomegaly  Plan -Cardiac Monitoring  -Titrate Cleveprex gtt for SBP goal <180  (currently off) -PRN Labetalol/Hydralazine  -Start scheduled Norvasc and Hydralazine, add labetalol  -Obtain CTA to r/o dissection given ongoing chest pain  -ECHO pending  -Aldosterone/Renin pending  -UDS pending   Hypokalemia  Plan -Replacing now  -Trend BMP   Chronic Renal Insufficieny in setting of uncontrolled HTN Plan -Trend BMP -Avoid Nephrotoxic medications    Best Practice (right click and "Reselect all SmartList Selections" daily)   Diet/type: Regular consistency (see orders) DVT prophylaxis: systemic heparin GI prophylaxis: N/A Lines: N/A Foley:  N/A Code Status:  full code Last date of multidisciplinary goals of care discussion [pending]  Labs   CBC: Recent Labs  Lab 01/25/22 1000  WBC 12.0*  HGB 14.4  HCT 42.2  MCV 90.9  PLT 251    Basic Metabolic Panel: Recent Labs  Lab 01/25/22 1000  NA 137  K 2.8*  CL 99  CO2 27  GLUCOSE 118*  BUN 16  CREATININE 1.16*  CALCIUM 9.1   GFR: Estimated Creatinine Clearance: 82.2 mL/min (A) (by C-G formula based on SCr of 1.16 mg/dL (H)). Recent Labs  Lab 01/25/22 1000  WBC 12.0*    Liver Function Tests: No results for input(s): "AST", "ALT", "ALKPHOS", "BILITOT", "PROT", "ALBUMIN" in the last 168 hours. No results for input(s): "LIPASE", "AMYLASE" in the last 168 hours. No results for input(s): "AMMONIA" in the last 168 hours.  ABG No results found for: "PHART", "PCO2ART", "PO2ART", "HCO3", "TCO2", "ACIDBASEDEF", "O2SAT"   Coagulation Profile: No results  for input(s): "INR", "PROTIME" in the last 168 hours.  Cardiac Enzymes: No results for input(s): "CKTOTAL", "CKMB", "CKMBINDEX", "TROPONINI" in the last 168 hours.  HbA1C: Hgb A1c MFr Bld  Date/Time Value Ref Range Status  11/11/2019 08:08 AM 5.4 4.8 - 5.6 % Final    Comment:    (NOTE) Pre diabetes:          5.7%-6.4% Diabetes:              >6.4% Glycemic control for   <7.0% adults with diabetes     CBG: No results for input(s):  "GLUCAP" in the last 168 hours.  Review of Systems:   +nausea, chest pain improving   Past Medical History:  She,  has a past medical history of Herpes, Hypertension, and Migraine.   Surgical History:   Past Surgical History:  Procedure Laterality Date   CESAREAN SECTION     TUBAL LIGATION       Social History:   reports that she has never smoked. She has never used smokeless tobacco. She reports current alcohol use of about 4.0 standard drinks of alcohol per week. She reports that she does not use drugs.   Family History:  Her family history is not on file.   Allergies Allergies  Allergen Reactions   Lisinopril Swelling     Home Medications  Prior to Admission medications   Medication Sig Start Date End Date Taking? Authorizing Provider  acetaminophen (TYLENOL) 500 MG tablet Take 1,000 mg by mouth every 6 (six) hours as needed for mild pain.    [provider]  amLODipine (NORVASC) 10 MG tablet Take 1 tablet (10 mg total) by mouth daily. 11/15/19   Amin, Loura Halt, MD  amLODipine (NORVASC) 10 MG tablet Take 1 tablet (10 mg total) by mouth daily. 02/08/21 03/10/21  Gailen Shelter, PA  cloNIDine (CATAPRES) 0.2 MG tablet Take 1 tablet (0.2 mg total) by mouth 3 (three) times daily. 11/14/19 12/14/19  Amin, Loura Halt, MD  hydrALAZINE (APRESOLINE) 100 MG tablet Take 1 tablet (100 mg total) by mouth 3 (three) times daily. 11/14/19 12/14/19  Amin, Loura Halt, MD  hydrochlorothiazide (HYDRODIURIL) 25 MG tablet Take 25 mg by mouth daily. 12/28/20   [provider]  hydrochlorothiazide (HYDRODIURIL) 25 MG tablet Take 1 tablet (25 mg total) by mouth daily. 02/08/21 03/10/21  Gailen Shelter, PA  Multiple Vitamin (MULTIVITAMIN WITH MINERALS) TABS tablet Take 1 tablet by mouth daily.    [provider]  potassium chloride (KLOR-CON) 10 MEQ tablet Take 2 tablets (20 mEq total) by mouth 2 (two) times daily for 5 days. 02/08/21 02/13/21  Gailen Shelter, PA  potassium  chloride SA (KLOR-CON) 20 MEQ tablet Take 2 tablets (40 mEq total) by mouth 2 (two) times daily for 3 doses. 11/14/19 11/16/19  Dimple Nanas, MD     Critical care time: 44 minutes     Jovita Kussmaul, AGACNP-BC [facility]  PCCM Pgr: [phone]

## 2022-01-25 NOTE — ED Notes (Signed)
Report to ED Charge, Chestine Spore, RN

## 2022-01-25 NOTE — ED Triage Notes (Signed)
Pt ambulated to bathroom with steady gait to provide urine sample.  Reports chest pain beginning a week ago, intermittent with exertion.  Hx HTN, no pmd.  Comes to ED when feeling poorly, states last time took bp meds last year.  Reports intermittent headache, vomited at times.  Works in [employer].

## 2022-01-25 NOTE — ED Provider Notes (Signed)
[facility] EMERGENCY DEPARTMENT Provider Note   CSN: [id number] Arrival date & time: 01/25/22  0930     History  Chief Complaint  Patient presents with   Chest Pain    Robin Jarvis is a 46 y.o. female who presents the emergency department complaining of chest pain, shortness of breath, and vomiting.  Patient states that she has been experiencing this chest pain for about a week, and reports that it is intermittent, and worse with exertion.  Reports intermittent blurry vision, 3 episodes of emesis.  Has history of significant hypertension, does not take any medication as she does not have insurance. She left her job site in [place], packed up and drove back home to [place].  She is supposed to be on hydralazine, clonidine, amlodipine, and hydrochlorothiazide. Has not taken any since last seen in ER in June of last year.    Chest Pain Associated symptoms: nausea, shortness of breath and vomiting   Associated symptoms: no abdominal pain, no cough, no fever, no headache and no palpitations        Home Medications Prior to Admission medications   Medication Sig Start Date End Date Taking? Authorizing Provider  acetaminophen (TYLENOL) 500 MG tablet Take 1,000 mg by mouth every 6 (six) hours as needed for mild pain.    [provider]  amLODipine (NORVASC) 10 MG tablet Take 1 tablet (10 mg total) by mouth daily. 11/15/19   Amin, Loura Halt, MD  amLODipine (NORVASC) 10 MG tablet Take 1 tablet (10 mg total) by mouth daily. 02/08/21 03/10/21  Gailen Shelter, PA  cloNIDine (CATAPRES) 0.2 MG tablet Take 1 tablet (0.2 mg total) by mouth 3 (three) times daily. 11/14/19 12/14/19  Amin, Loura Halt, MD  hydrALAZINE (APRESOLINE) 100 MG tablet Take 1 tablet (100 mg total) by mouth 3 (three) times daily. 11/14/19 12/14/19  Amin, Loura Halt, MD  hydrochlorothiazide (HYDRODIURIL) 25 MG tablet Take 25 mg by mouth daily. 12/28/20   [provider]  hydrochlorothiazide  (HYDRODIURIL) 25 MG tablet Take 1 tablet (25 mg total) by mouth daily. 02/08/21 03/10/21  Gailen Shelter, PA  Multiple Vitamin (MULTIVITAMIN WITH MINERALS) TABS tablet Take 1 tablet by mouth daily.    [provider]  potassium chloride (KLOR-CON) 10 MEQ tablet Take 2 tablets (20 mEq total) by mouth 2 (two) times daily for 5 days. 02/08/21 02/13/21  Gailen Shelter, PA  potassium chloride SA (KLOR-CON) 20 MEQ tablet Take 2 tablets (40 mEq total) by mouth 2 (two) times daily for 3 doses. 11/14/19 11/16/19  Dimple Nanas, MD      Allergies    Lisinopril    Review of Systems   Review of Systems  Constitutional:  Negative for fever.  Eyes:  Positive for visual disturbance.  Respiratory:  Positive for chest tightness and shortness of breath. Negative for cough.   Cardiovascular:  Positive for chest pain. Negative for palpitations and leg swelling.  Gastrointestinal:  Positive for nausea and vomiting. Negative for abdominal pain.  Neurological:  Negative for headaches.  All other systems reviewed and are negative.   Physical Exam Updated Vital Signs BP (!) 182/109   Pulse 66   Temp 98.5 F (36.9 C) (Oral)   Resp 20   Ht 5\' 9"  (1.753 m)   Wt 108.9 kg   LMP 12/22/2021   SpO2 94%   BMI 35.44 kg/m  Physical Exam Vitals and nursing note reviewed.  Constitutional:      Appearance:  Normal appearance.     Comments: Appears anxious and tearful  HENT:     Head: Normocephalic and atraumatic.  Eyes:     Conjunctiva/sclera: Conjunctivae normal.  Cardiovascular:     Rate and Rhythm: Regular rhythm. Tachycardia present.  Pulmonary:     Effort: Pulmonary effort is normal. No respiratory distress.     Breath sounds: Normal breath sounds.  Abdominal:     General: There is no distension.     Palpations: Abdomen is soft.     Tenderness: There is no abdominal tenderness.  Musculoskeletal:     Right lower leg: No edema.     Left lower leg: No edema.  Skin:    General: Skin is warm  and dry.  Neurological:     General: No focal deficit present.     Mental Status: She is alert.     ED Results / Procedures / Treatments   Labs (all labs ordered are listed, but only abnormal results are displayed) Labs Reviewed  BASIC METABOLIC PANEL - Abnormal; Notable for the following components:      Result Value   Potassium 2.8 (*)    Glucose, Bld 118 (*)    Creatinine, Ser 1.16 (*)    GFR, Estimated 60 (*)    All other components within normal limits  CBC - Abnormal; Notable for the following components:   WBC 12.0 (*)    All other components within normal limits  BRAIN NATRIURETIC PEPTIDE - Abnormal; Notable for the following components:   B Natriuretic Peptide 1,003.2 (*)    All other components within normal limits  TROPONIN I (HIGH SENSITIVITY) - Abnormal; Notable for the following components:   Troponin I (High Sensitivity) 36 (*)    All other components within normal limits  TROPONIN I (HIGH SENSITIVITY) - Abnormal; Notable for the following components:   Troponin I (High Sensitivity) 34 (*)    All other components within normal limits  PREGNANCY, URINE    EKG EKG Interpretation  Date/Time:  Thursday January 25 2022 09:45:24 EDT Ventricular Rate:  97 PR Interval:  178 QRS Duration: 90 QT Interval:  392 QTC Calculation: 498 R Axis:   -21 Text Interpretation: Sinus tachycardia Multiple premature complexes, vent & supraven LAE, consider biatrial enlargement Abnormal R-wave progression, late transition Left ventricular hypertrophy Borderline T abnormalities, lateral leads Borderline prolonged QT interval No significant change since last tracing Confirmed by Varney Biles [phone]) on 01/25/2022 11:21:42 AM  Radiology DG Chest 2 View  Result Date: 01/25/2022 CLINICAL DATA:  Chest pain EXAM: CHEST - 2 VIEW COMPARISON:  02/08/2021 FINDINGS: Transverse diameter of heart is increased. Central pulmonary vessels are prominent. There are no signs of alveolar pulmonary  edema or focal pulmonary consolidation. There is no significant pleural effusion or pneumothorax. IMPRESSION: Cardiomegaly. Central pulmonary vessels are more prominent suggesting possible mild CHF. There are no signs of alveolar pulmonary edema. There is no focal pulmonary consolidation. Electronically Signed   By: Elmer Picker M.D.   On: 01/25/2022 10:24    Procedures .Critical Care  Performed by: Kateri Plummer, PA-C Authorized by: Kateri Plummer, PA-C   Critical care provider statement:    Critical care time (minutes):  30   Critical care time was exclusive of:  Separately billable procedures and treating other patients   Critical care was necessary to treat or prevent imminent or life-threatening deterioration of the following conditions:  Cardiac failure and circulatory failure (hypertensive urgency/emergency with chest pain)   Critical care  was time spent personally by me on the following activities:  Development of treatment plan with patient or surrogate, discussions with consultants, evaluation of patient's response to treatment, examination of patient, ordering and review of laboratory studies, ordering and review of radiographic studies, ordering and performing treatments and interventions, pulse oximetry, re-evaluation of patient's condition and review of old charts     Medications Ordered in ED Medications  labetalol (NORMODYNE) injection 40 mg (40 mg Intravenous Given 01/25/22 1247)  potassium chloride 10 mEq in 100 mL IVPB (10 mEq Intravenous New Bag/Given 01/25/22 1236)  clevidipine (CLEVIPREX) infusion 0.5 mg/mL (has no administration in time range)  nitroGLYCERIN (NITROGLYN) 2 % ointment 1 inch (1 inch Topical Given 01/25/22 1020)  labetalol (NORMODYNE) injection 20 mg (20 mg Intravenous Given 01/25/22 1112)  amLODipine (NORVASC) tablet 10 mg (10 mg Oral Given 01/25/22 1151)  hydrALAZINE (APRESOLINE) tablet 50 mg (50 mg Oral Given 01/25/22 1151)  potassium chloride  SA (KLOR-CON M) CR tablet 40 mEq (40 mEq Oral Given 01/25/22 1236)    ED Course/ Medical Decision Making/ A&P                           Medical Decision Making Amount and/or Complexity of Data Reviewed Labs: ordered. Radiology: ordered.  Risk Prescription drug management.   This patient is a 46 y.o. female who presents to the ED for concern of chest pain, this involves an extensive number of treatment options, and is a complaint that carries with it a high risk of complications and morbidity.   The emergent differential diagnosis prior to evaluation includes, but is not limited to,  ACS, pericarditis, aortic dissection, PE, pneumothorax, esophageal spasm or rupture, chronic angina, valvular disease, cardiomyopathy, myocarditis, pulmonary HTN, pneumonia, bronchitis, GERD, reflux/PUD, biliary disease, pancreatitis, disk disease, costochondritis, anxiety or panic attack. This is not an exhaustive differential.   Past Medical History / Co-morbidities / Social History: Hypertension  Additional history: Chart reviewed. Pertinent results include: Patient was seen for similar symptoms about 1 year ago at this facility.  Labs were significant for mild elevation in troponin and BNP, as well as hypokalemia.  She was given IV blood pressure medications and had improvement of her blood pressure.  She was recommended to be admitted for ongoing management, but she declined.  However patient was admitted in March 2021 for hypertensive urgency.  She had evidence of diastolic dysfunction and ventricular hypertrophy, as well as pulmonary edema.  She required Lasix diuresis in the hospital.   Physical Exam: Physical exam performed. The pertinent findings include: Upper tensive to 230/127, tachycardic to 101.  Patient appears anxious and tearful.   Lab Tests: I ordered, and personally interpreted labs.  The pertinent results include: Leukocytosis of 12.0.  Hypokalemia 2.8.  Creatinine 1.16.  Initial  troponin 36.   Imaging Studies: I ordered imaging studies including chest x-ray. I independently visualized and interpreted imaging which showed cardiomegaly and prominent central pulmonary vessels, no pulmonary edema or focal consolidation. I agree with the radiologist interpretation.   Cardiac Monitoring:  The patient was maintained on a cardiac monitor.  My attending physician Dr. Rhunette Croft viewed and interpreted the cardiac monitored which showed an underlying rhythm of: sinus tachycardia with LVH, no significant change since last tracing. I agree with this interpretation.   Medications: I ordered medication including nitroglycerin, IV and PO blood pressure medications, and potassium replacement for hypokalemia and BP reduction goal ~ 170 systolic and MAP of 120.  Reevaluation of the patient after these medicines showed that the patient  mildly improved . I have reviewed the patients home medicines and have made adjustments as needed.  Consultations Obtained: I requested consultation with the intensivist Dr Carlis Abbott,  and discussed lab and imaging findings as well as pertinent plan - they recommend: ED to ED transfer to [facility] where critical care will evaluate as there are no ICU beds at the moment   Disposition: After consideration of the diagnostic results and the patients response to treatment, I feel that patient is requiring critical care admission after transfer.   I discussed this case with my attending physician Dr. Kathrynn Humble who cosigned this note including patient's presenting symptoms, physical exam, and planned diagnostics and interventions. Attending physician stated agreement with plan or made changes to plan which were implemented.     Final Clinical Impression(s) / ED Diagnoses Final diagnoses:  Elevated brain natriuretic peptide (BNP) level  Hypertensive emergency    Rx / DC Orders ED Discharge Orders     None      Portions of this report may have been transcribed  using voice recognition software. Every effort was made to ensure accuracy; however, inadvertent computerized transcription errors may be present.    Kateri Plummer, PA-C 01/25/22 [place], Ankit, MD 01/26/22 [phone]

## 2022-01-26 ENCOUNTER — Inpatient Hospital Stay (HOSPITAL_COMMUNITY): Payer: Self-pay

## 2022-01-26 DIAGNOSIS — I5030 Unspecified diastolic (congestive) heart failure: Secondary | ICD-10-CM

## 2022-01-26 DIAGNOSIS — I161 Hypertensive emergency: Secondary | ICD-10-CM

## 2022-01-26 LAB — CBC
HCT: 38.2 % (ref 36.0–46.0)
Hemoglobin: 12.9 g/dL (ref 12.0–15.0)
MCH: 31.2 pg (ref 26.0–34.0)
MCHC: 33.8 g/dL (ref 30.0–36.0)
MCV: 92.3 fL (ref 80.0–100.0)
Platelets: 265 10*3/uL (ref 150–400)
RBC: 4.14 MIL/uL (ref 3.87–5.11)
RDW: 14.6 % (ref 11.5–15.5)
WBC: 14.5 10*3/uL — ABNORMAL HIGH (ref 4.0–10.5)
nRBC: 0 % (ref 0.0–0.2)

## 2022-01-26 LAB — BASIC METABOLIC PANEL
Anion gap: 11 (ref 5–15)
BUN: 14 mg/dL (ref 6–20)
CO2: 24 mmol/L (ref 22–32)
Calcium: 8.3 mg/dL — ABNORMAL LOW (ref 8.9–10.3)
Chloride: 100 mmol/L (ref 98–111)
Creatinine, Ser: 1.27 mg/dL — ABNORMAL HIGH (ref 0.44–1.00)
GFR, Estimated: 54 mL/min — ABNORMAL LOW (ref >60)
Glucose, Bld: 113 mg/dL — ABNORMAL HIGH (ref 70–99)
Potassium: 3.3 mmol/L — ABNORMAL LOW (ref 3.5–5.1)
Sodium: 135 mmol/L (ref 135–145)

## 2022-01-26 LAB — ECHOCARDIOGRAM COMPLETE
Area-P 1/2: 6.32 cm2
Calc EF: 41.9 %
Height: 69 in
S' Lateral: 3.2 cm
Single Plane A2C EF: 48.3 %
Single Plane A4C EF: 34.8 %
Weight: 3840 oz

## 2022-01-26 LAB — PHOSPHORUS: Phosphorus: 3.4 mg/dL (ref 2.5–4.6)

## 2022-01-26 LAB — MAGNESIUM: Magnesium: 1.7 mg/dL (ref 1.7–2.4)

## 2022-01-26 MED ORDER — HYDRALAZINE HCL 50 MG PO TABS
50.0000 mg | ORAL_TABLET | Freq: Three times a day (TID) | ORAL | Status: DC
  Administered 2022-01-26 – 2022-01-27 (×3): 50 mg via ORAL
  Filled 2022-01-26 (×4): qty 1

## 2022-01-26 MED ORDER — MAGNESIUM SULFATE 2 GM/50ML IV SOLN
2.0000 g | Freq: Once | INTRAVENOUS | Status: AC
  Administered 2022-01-26: 2 g via INTRAVENOUS
  Filled 2022-01-26: qty 50

## 2022-01-26 MED ORDER — MELATONIN 3 MG PO TABS
3.0000 mg | ORAL_TABLET | Freq: Every evening | ORAL | Status: AC | PRN
  Administered 2022-01-26 (×2): 3 mg via ORAL
  Filled 2022-01-26 (×3): qty 1

## 2022-01-26 MED ORDER — LABETALOL HCL 100 MG PO TABS
100.0000 mg | ORAL_TABLET | Freq: Three times a day (TID) | ORAL | Status: DC
  Administered 2022-01-26 – 2022-01-27 (×3): 100 mg via ORAL
  Filled 2022-01-26 (×3): qty 1

## 2022-01-26 MED ORDER — POTASSIUM CHLORIDE CRYS ER 20 MEQ PO TBCR
20.0000 meq | EXTENDED_RELEASE_TABLET | ORAL | Status: AC
  Administered 2022-01-26 (×2): 20 meq via ORAL
  Filled 2022-01-26 (×2): qty 1

## 2022-01-26 MED ORDER — CHLORHEXIDINE GLUCONATE CLOTH 2 % EX PADS
6.0000 | MEDICATED_PAD | Freq: Every day | CUTANEOUS | Status: DC
  Administered 2022-01-26 – 2022-01-27 (×2): 6 via TOPICAL

## 2022-01-26 MED ORDER — LACTATED RINGERS IV SOLN: INTRAVENOUS | Status: AC

## 2022-01-26 MED ORDER — POTASSIUM CHLORIDE CRYS ER 20 MEQ PO TBCR
20.0000 meq | EXTENDED_RELEASE_TABLET | ORAL | Status: DC
  Administered 2022-01-26: 20 meq via ORAL
  Filled 2022-01-26: qty 1

## 2022-01-26 MED ORDER — POTASSIUM CHLORIDE 10 MEQ/100ML IV SOLN
10.0000 meq | INTRAVENOUS | Status: DC
  Administered 2022-01-26 (×2): 10 meq via INTRAVENOUS
  Filled 2022-01-26 (×4): qty 100

## 2022-01-26 NOTE — Progress Notes (Signed)
[facility] ADULT ICU REPLACEMENT PROTOCOL   The patient does apply for the [facility] Adult ICU Electrolyte Replacment Protocol based on the criteria listed below:   1.Exclusion criteria: TCTS patients, ECMO patients, and Dialysis patients 2. Is GFR >/= 30 ml/min? Yes.    Patient's GFR today is 54 3. Is SCr </= 2? Yes.   Patient's SCr is 1.27 mg/dL 4. Did SCr increase >/= 0.5 in 24 hours? No. 5.Pt's weight >40kg  Yes.   6. Abnormal electrolyte(s): mag 1.7, K+ 3.3  7. Electrolytes replaced per protocol 8.  Call MD STAT for K+ </= 2.5, Phos </= 1, or Mag </= 1 Physician:  n/a   Melvern Banker 01/26/2022 5:08 AM

## 2022-01-26 NOTE — Progress Notes (Signed)
eLink Physician-Brief Progress Note Patient Name: Robin Jarvis DOB: 1975-08-24 MRN: [id number]   Date of Service  01/26/2022  HPI/Events of Note  Patient is requesting a sleep aid.  eICU Interventions  Melatonin 3 mg po Q HS PRN ordered.        Ottilie Wigglesworth U Abie Killian 01/26/2022, 1:01 AM

## 2022-01-26 NOTE — Progress Notes (Signed)
   NAME:  Mechille Varghese, MRN:  [id number], DOB:  24-Nov-1975, LOS: 1 ADMISSION DATE:  01/25/2022, CONSULTATION DATE:  6/16 REFERRING MD:  Rhunette Croft, CHIEF COMPLAINT:  Hypertension   History of Present Illness:  46 y/o old female with uncontrolled hypertension presented to [facility] on 6/15 with chest pain, headache and hypertensive emergency.    Pertinent  Medical History  HTN, Cardiomegaly, Grade 1 DD previous EF 50-55, Mild renal insuffiencey   Significant Hospital Events: Including procedures, antibiotic start and stop dates in addition to other pertinent events   6/15 admission  Interim History / Subjective:  Feels like she is "in a fog" this morning BP dropped last night after oral medication administration  Objective   Blood pressure (!) 153/89, pulse 63, temperature 99 F (37.2 C), temperature source Oral, resp. rate (!) 25, height 5\' 9"  (1.753 m), weight 108.9 kg, last menstrual period 12/22/2021, SpO2 95 %.        Intake/Output Summary (Last 24 hours) at 01/26/2022 1021 Last data filed at 01/26/2022 0900 Gross per 24 hour  Intake 1126.21 ml  Output 900 ml  Net 226.21 ml   Filed Weights   01/25/22 0945  Weight: 108.9 kg    Examination:  General:  Resting comfortably in bed getting TTE HENT: NCAT OP clear PULM: symmetric chest rise CV: extremities warm, well perfused GI: non-distended MSK: normal bulk and tone Neuro: awake, alert, no distress, MAEW   Resolved Hospital Problem list    Assessment & Plan:  Hypertensive emergency with chest pain and headache > UDS negative 6/16 blood pressure dropped overnight more than goal, neuro exam non-focal Concern for adrenaloma given hypokalemia> work up in 2021 was negative MAP goal today is 100-120 Decrease hydralazine and labetalol by 50% today Continue amlodipine Continue prn hydralazine and labetalol Gentle IV fluids F/u plasma renin activity/aldosterone level  AKI, unclear baseline Cr Monitor BMET and  UOP Replace electrolytes as needed Encourage po intake today Gentle IV fluids  Mild confusion today (feels like she is in a fog), neuro exam non-focal Suspect related to BP change Decrease oral anti-hypertensives this morning  Morbid obesity Counsel to lose weight  LVH on EKG Echo today  Chest pain> no ischemia noted on EKG Tele Monitor  Move to progressive care   Best Practice (right click and "Reselect all SmartList Selections" daily)   Diet/type: Regular consistency (see orders) DVT prophylaxis: prophylactic heparin  GI prophylaxis: N/A Lines: N/A Foley:  N/A Code Status:  full code Last date of multidisciplinary goals of care discussion [6/15 full code]  Critical care time: 35 minutes     07-19-2006, MD Reader PCCM Pager: [phone] Cell: [phone] After 7:00 pm call Elink  [phone]

## 2022-01-26 NOTE — TOC Progression Note (Signed)
Transition of Care [facility]) - Progression Note    Patient Details  Name: Robin Jarvis MRN: [id number] Date of Birth: 12-09-1975  Transition of Care [facility]) CM/SW Contact  Tom-Johnson, Hershal Coria, RN Phone Number: 01/26/2022, 4:26 PM  Clinical Narrative:       Patient admitted with Hypertensive emergency. Does not have a PCP or Medical Insurance.  MATCH done for prescription assistance. MD to send prescription to [facility] pharmacy. Message sent to Manufacturing engineer for Medicaid eligibility. Awaiting their response. Hosp f/u will be scheduled when patient is medically ready for discharge. CM will continue to follow with needs.    Expected Discharge Plan and Services                                                 Social Determinants of Health (SDOH) Interventions    Readmission Risk Interventions     No data to display

## 2022-01-27 ENCOUNTER — Encounter (HOSPITAL_COMMUNITY): Payer: Self-pay | Admitting: Pulmonary Disease

## 2022-01-27 ENCOUNTER — Inpatient Hospital Stay (HOSPITAL_COMMUNITY): Payer: Self-pay

## 2022-01-27 DIAGNOSIS — R7989 Other specified abnormal findings of blood chemistry: Secondary | ICD-10-CM

## 2022-01-27 DIAGNOSIS — R778 Other specified abnormalities of plasma proteins: Secondary | ICD-10-CM

## 2022-01-27 DIAGNOSIS — I420 Dilated cardiomyopathy: Secondary | ICD-10-CM

## 2022-01-27 DIAGNOSIS — Z91199 Patient's noncompliance with other medical treatment and regimen due to unspecified reason: Secondary | ICD-10-CM

## 2022-01-27 LAB — CBC
HCT: 40.4 % (ref 36.0–46.0)
Hemoglobin: 13.2 g/dL (ref 12.0–15.0)
MCH: 31.1 pg (ref 26.0–34.0)
MCHC: 32.7 g/dL (ref 30.0–36.0)
MCV: 95.1 fL (ref 80.0–100.0)
Platelets: 273 10*3/uL (ref 150–400)
RBC: 4.25 MIL/uL (ref 3.87–5.11)
RDW: 15.1 % (ref 11.5–15.5)
WBC: 11.3 10*3/uL — ABNORMAL HIGH (ref 4.0–10.5)
nRBC: 0 % (ref 0.0–0.2)

## 2022-01-27 LAB — BASIC METABOLIC PANEL
Anion gap: 8 (ref 5–15)
BUN: 14 mg/dL (ref 6–20)
CO2: 24 mmol/L (ref 22–32)
Calcium: 8.2 mg/dL — ABNORMAL LOW (ref 8.9–10.3)
Chloride: 103 mmol/L (ref 98–111)
Creatinine, Ser: 1.08 mg/dL — ABNORMAL HIGH (ref 0.44–1.00)
GFR, Estimated: >60 mL/min (ref >60)
Glucose, Bld: 112 mg/dL — ABNORMAL HIGH (ref 70–99)
Potassium: 3.8 mmol/L (ref 3.5–5.1)
Sodium: 135 mmol/L (ref 135–145)

## 2022-01-27 LAB — PHOSPHORUS: Phosphorus: 3.1 mg/dL (ref 2.5–4.6)

## 2022-01-27 LAB — MAGNESIUM: Magnesium: 2.2 mg/dL (ref 1.7–2.4)

## 2022-01-27 MED ORDER — ISOSORB DINITRATE-HYDRALAZINE 20-37.5 MG PO TABS
1.0000 | ORAL_TABLET | Freq: Two times a day (BID) | ORAL | Status: DC
  Administered 2022-01-27 – 2022-01-28 (×3): 1 via ORAL
  Filled 2022-01-27 (×3): qty 1

## 2022-01-27 MED ORDER — ISOSORB DINITRATE-HYDRALAZINE 20-37.5 MG PO TABS
2.0000 | ORAL_TABLET | Freq: Two times a day (BID) | ORAL | Status: DC
Start: 2022-01-27 — End: 2022-01-27

## 2022-01-27 MED ORDER — SPIRONOLACTONE 12.5 MG HALF TABLET
12.5000 mg | ORAL_TABLET | Freq: Every day | ORAL | Status: DC
  Administered 2022-01-27 – 2022-01-28 (×2): 12.5 mg via ORAL
  Filled 2022-01-27 (×2): qty 1

## 2022-01-27 MED ORDER — CARVEDILOL 6.25 MG PO TABS
18.7500 mg | ORAL_TABLET | Freq: Two times a day (BID) | ORAL | Status: DC
  Administered 2022-01-27 – 2022-01-28 (×4): 18.75 mg via ORAL
  Filled 2022-01-27 (×4): qty 1

## 2022-01-27 NOTE — Progress Notes (Signed)
Progress Note:    Robin Jarvis    [id number] DOB: [birth year] DOA: 01/25/2022  PCP: System, Provider Not In    Brief Narrative:   46 year old African-American female with a past medical history of hypertension, morbid obesity, medication noncompliance due to no insurance, history of grade 1 diastolic dysfunction and cardiomegaly.  She was admitted for hypertensive emergency and chest pain and initially on Cleviprex drip but now weaned off.  Initially under ICU care.  TRH now taking over.   Subjective:   No acute complaints.  Chest pain resolved.  No syncope, presyncope.  No shortness of breath.  No peripheral edema.   Assessment and Plan:   Dilated cardiomyopathy: Previous echo in 2021 showed EF 50 to 55%.  Echo this admission shows EF [id number], grade 2 diastolic dysfunction, severe LVH and global hypokinesis of the left ventricle.  No signs of decompensation.  No peripheral edema.  Not hypoxic.  Chest x-ray negative.  Cardiology consulted for guideline directed medical therapy.  Hypertensive emergency: Weaned off Cleviprex drip.  Continue Norvasc and hydralazine.  Chest pain: Mildly elevated high sensitive troponins now flattened.  Likely due to demand ischemia.  Chest pain resolved.  Doubt ACS.  Chronic kidney disease stage II/IIIa: Appears at baseline.  Baseline creatinine 0.9-1.1.  Morbid obesity: BMI 35+ hypertension  Medication noncompliance: No insurance.  TOC consulted.   Other information:    DVT prophylaxis: Heparin subcu Code Status: Full code Family Communication: No family present at bedside Disposition:   Status is: Inpatient Remains inpatient appropriate because: Cardiomyopathy medication optimization       Consultants:   Cardiology    Objective:    Vitals:   01/27/22 1030 01/27/22 1045 01/27/22 1100 01/27/22 1152  BP:   (!) 168/92   Pulse: 63 73 71   Resp: 14 18 19    Temp:    99.2 F (37.3 C)  TempSrc:    Oral  SpO2: 99% 99% 99%    Weight:      Height:        Intake/Output Summary (Last 24 hours) at 01/27/2022 1301 Last data filed at 01/27/2022 0900 Gross per 24 hour  Intake 1691.58 ml  Output --  Net 1691.58 ml   Filed Weights   01/25/22 0945  Weight: 108.9 kg       Physical Exam:    General exam: Appears calm and comfortable  Respiratory system: Clear to auscultation. Respiratory effort normal. Cardiovascular system: S1 & S2 heard, RRR. No JVD, murmurs, rubs, gallops or clicks. No pedal edema. Gastrointestinal system: Abdomen is nondistended, soft and nontender. No organomegaly or masses felt. Normal bowel sounds heard. Central nervous system: Alert and oriented. No focal neurological deficits. Extremities: Symmetric 5 x 5 power. Skin: No rashes, lesions or ulcers Psychiatry: Judgement and insight appear normal. Mood & affect appropriate.     Data Reviewed:    I have personally reviewed following labs and imaging studies  CBC: Recent Labs  Lab 01/25/22 1000 01/26/22 0324 01/27/22 0225  WBC 12.0* 14.5* 11.3*  HGB 14.4 12.9 13.2  HCT 42.2 38.2 40.4  MCV 90.9 92.3 95.1  PLT 251 265 [id number]    Basic Metabolic Panel: Recent Labs  Lab 01/25/22 1000 01/25/22 1814 01/26/22 0324 01/27/22 0225  NA 137 138 135 135  K 2.8* 3.0* 3.3* 3.8  CL 99 98 100 103  CO2 27 26 24 24   GLUCOSE 118* 128* 113* 112*  BUN 16 12 14 14   CREATININE 1.16* 1.18*  1.27* 1.08*  CALCIUM 9.1 8.9 8.3* 8.2*  MG  --   --  1.7 2.2  PHOS  --   --  3.4 3.1    GFR: Estimated Creatinine Clearance: 88.3 mL/min (A) (by C-G formula based on SCr of 1.08 mg/dL (H)).  Liver Function Tests: No results for input(s): "AST", "ALT", "ALKPHOS", "BILITOT", "PROT", "ALBUMIN" in the last 168 hours.  CBG: Recent Labs  Lab 01/25/22 1515  GLUCAP 146*     No results found for this or any previous visit (from the past 240 hour(s)).       Radiology Studies:    DG Chest Port 1 View  Result Date: 01/27/2022 CLINICAL DATA:   Dyspnea. EXAM: PORTABLE CHEST 1 VIEW COMPARISON:  01/25/2022 FINDINGS: Lungs are adequately inflated without airspace consolidation, effusion or pneumothorax. Mild stable cardiomegaly. Remainder of the exam is unchanged. IMPRESSION: 1. No acute findings. 2. Mild stable cardiomegaly. Electronically Signed   By: Marin Olp M.D.   On: 01/27/2022 10:07   ECHOCARDIOGRAM COMPLETE  Result Date: 01/26/2022    ECHOCARDIOGRAM REPORT   Patient Name:   Robin Jarvis Date of Exam: 01/26/2022 Medical Rec #:  [id number]   Height:       69.0 in Accession #:    [id number]  Weight:       240.0 lb Date of Birth:  10-22-1975   BSA:          2.232 m Patient Age:    89 years    BP:           153/89 mmHg Patient Gender: F           HR:           72 bpm. Exam Location:  Inpatient Procedure: 2D Echo, Cardiac Doppler, Color Doppler and Strain Analysis Indications:    CHF  History:        Patient has prior history of Echocardiogram examinations, most                 recent 11/10/2019. Cardiomegaly, Signs/Symptoms:Chest Pain; Risk                 Factors:Hypertension.  Sonographer:    Merrie Roof RDCS Referring Phys: [place]  1. Left ventricular ejection fraction, by estimation, is 40%. The left ventricle has mildly decreased function. The left ventricle demonstrates global hypokinesis. The left ventricular internal cavity size was mildly dilated. There is severe left ventricular hypertrophy. Left ventricular diastolic parameters are consistent with Grade II diastolic dysfunction (pseudonormalization). Elevated left ventricular end-diastolic pressure.  2. Right ventricular systolic function is normal. The right ventricular size is normal. There is moderately elevated pulmonary artery systolic pressure.  3. Left atrial size was severely dilated.  4. The pericardial effusion is posterior to the left ventricle.  5. The mitral valve is abnormal. Mild mitral valve regurgitation. No evidence of mitral stenosis.  6.  Tricuspid valve regurgitation is moderate to severe.  7. The aortic valve is tricuspid. There is mild calcification of the aortic valve. There is mild thickening of the aortic valve. Aortic valve regurgitation is trivial. Aortic valve sclerosis is present, with no evidence of aortic valve stenosis.  8. The inferior vena cava is dilated in size with <50% respiratory variability, suggesting right atrial pressure of 15 mmHg. FINDINGS  Left Ventricle: Left ventricular ejection fraction, by estimation, is 40%. The left ventricle has mildly decreased function. The left ventricle demonstrates global hypokinesis. The left ventricular internal cavity  size was mildly dilated. There is severe left ventricular hypertrophy. Left ventricular diastolic parameters are consistent with Grade II diastolic dysfunction (pseudonormalization). Elevated left ventricular end-diastolic pressure. Right Ventricle: The right ventricular size is normal. No increase in right ventricular wall thickness. Right ventricular systolic function is normal. There is moderately elevated pulmonary artery systolic pressure. The tricuspid regurgitant velocity is 3.09 m/s, and with an assumed right atrial pressure of 15 mmHg, the estimated right ventricular systolic pressure is 53.2 mmHg. Left Atrium: Left atrial size was severely dilated. Right Atrium: Right atrial size was normal in size. Pericardium: Trivial pericardial effusion is present. The pericardial effusion is posterior to the left ventricle. Mitral Valve: The mitral valve is abnormal. There is mild thickening of the mitral valve leaflet(s). Mild mitral valve regurgitation. No evidence of mitral valve stenosis. Tricuspid Valve: The tricuspid valve is normal in structure. Tricuspid valve regurgitation is moderate to severe. No evidence of tricuspid stenosis. Aortic Valve: The aortic valve is tricuspid. There is mild calcification of the aortic valve. There is mild thickening of the aortic valve.  Aortic valve regurgitation is trivial. Aortic valve sclerosis is present, with no evidence of aortic valve stenosis. Pulmonic Valve: The pulmonic valve was normal in structure. Pulmonic valve regurgitation is mild. No evidence of pulmonic stenosis. Aorta: The aortic root is normal in size and structure. Venous: The inferior vena cava is dilated in size with less than 50% respiratory variability, suggesting right atrial pressure of 15 mmHg. IAS/Shunts: No atrial level shunt detected by color flow Doppler.  LEFT VENTRICLE PLAX 2D LVIDd:         4.50 cm      Diastology LVIDs:         3.20 cm      LV e' medial:    5.77 cm/s LV PW:         1.70 cm      LV E/e' medial:  19.4 LV IVS:        1.70 cm      LV e' lateral:   8.81 cm/s LVOT diam:     2.10 cm      LV E/e' lateral: 12.7 LV SV:         58 LV SV Index:   26 LVOT Area:     3.46 cm  LV Volumes (MOD) LV vol d, MOD A2C: 152.0 ml LV vol d, MOD A4C: 128.0 ml LV vol s, MOD A2C: 78.6 ml LV vol s, MOD A4C: 83.5 ml LV SV MOD A2C:     73.4 ml LV SV MOD A4C:     128.0 ml LV SV MOD BP:      58.4 ml RIGHT VENTRICLE            IVC RV Basal diam:  4.30 cm    IVC diam: 2.50 cm RV Mid diam:    3.10 cm RV S prime:     9.79 cm/s TAPSE (M-mode): 1.6 cm LEFT ATRIUM              Index        RIGHT ATRIUM           Index LA diam:        5.30 cm  2.37 cm/m   RA Area:     29.40 cm LA Vol (A2C):   149.0 ml 66.74 ml/m  RA Volume:   109.00 ml 48.83 ml/m LA Vol (A4C):   87.6 ml  39.24 ml/m LA Biplane Vol: 118.0 ml  52.86 ml/m  AORTIC VALVE LVOT Vmax:   105.00 cm/s LVOT Vmean:  63.900 cm/s LVOT VTI:    0.168 m  AORTA Ao Root diam: 3.20 cm Ao Asc diam:  3.40 cm MITRAL VALVE                TRICUSPID VALVE MV Area (PHT): 6.32 cm     TR Peak grad:   38.2 mmHg MV Decel Time: 120 msec     TR Vmax:        309.00 cm/s MV E velocity: 112.00 cm/s MV A velocity: 34.60 cm/s   SHUNTS MV E/A ratio:  3.24         Systemic VTI:  0.17 m                             Systemic Diam: 2.10 cm Jenkins Rouge MD  Electronically signed by Jenkins Rouge MD Signature Date/Time: 01/26/2022/11:23:36 AM    Final    CT Angio Chest/Abd/Pel for Dissection W and/or W/WO  Result Date: 01/25/2022 CLINICAL DATA:  Chest pain and back pain. Aortic dissection suspected. EXAM: CT ANGIOGRAPHY CHEST, ABDOMEN AND PELVIS TECHNIQUE: 11/10/2019 Multidetector CT imaging through the chest, abdomen and pelvis was performed using the standard protocol during bolus administration of intravenous contrast. Multiplanar reconstructed images and MIPs were obtained and reviewed to evaluate the vascular anatomy. RADIATION DOSE REDUCTION: This exam was performed according to the departmental dose-optimization program which includes automated exposure control, adjustment of the mA and/or kV according to patient size and/or use of iterative reconstruction technique. CONTRAST:  148mL OMNIPAQUE IOHEXOL 350 MG/ML SOLN COMPARISON:  11/10/2019 FINDINGS: CTA CHEST FINDINGS Cardiovascular: Cardiomegaly. Small amount of pericardial fluid. Global cardiac enlargement. No visible coronary artery calcification or aortic atherosclerotic calcification. No aortic dissection. Prominent pulmonary arterial tree consistent with a degree of pulmonary arterial hypertension. Diameter of the main pulmonary artery measures 3.6 cm. No pulmonary emboli. Mediastinum/Nodes: Mild prominence of the right paratracheal lymph nodes. Similar appearance to the study of 2021 favors benign nature. Lungs/Pleura: Small pleural effusion on the left layering dependently. No definite pulmonary edema. No infiltrate, mass or nodule. Musculoskeletal: Negative Review of the MIP images confirms the above findings. CTA ABDOMEN AND PELVIS FINDINGS VASCULAR Aorta: Minimal atherosclerotic change of the infrarenal abdominal aorta. No aneurysm. No dissection. Celiac: Normal SMA: Normal Renals: Normal IMA: Normal Inflow: Normal Veins: Normal as seen without opacification. Review of the MIP images confirms the  above findings. NON-VASCULAR Hepatobiliary: No liver parenchymal abnormality seen. No calcified gallstones. Pancreas: Normal Spleen: Normal Adrenals/Urinary Tract: Adrenal glands are normal. Kidneys are normal. Bladder is normal. Stomach/Bowel: Stomach and small intestine are normal. Normal appendix. Normal colon. Lymphatic: No adenopathy. Reproductive: Normal. Other: No free fluid or air. Musculoskeletal: Ordinary mild lower lumbar degenerative changes. Review of the MIP images confirms the above findings. IMPRESSION: No aortic dissection or acute vascular pathology. There is only minimal atherosclerotic change of the infrarenal abdominal aorta. Global cardiomegaly. Small pericardial effusion. Prominent main pulmonary artery suggesting a degree of pulmonary arterial hypertension. Small left effusion layering dependently. Chronic mild prominence of the paratracheal lymph nodes, unchanged since 2021 and therefore probably not significant. No acute abdominal or in finding. Electronically Signed   By: Nelson Chimes M.D.   On: 01/25/2022 15:56        Medications:    Scheduled Meds:  amLODipine  10 mg Oral Daily   carvedilol  18.75 mg Oral BID WC  Chlorhexidine Gluconate Cloth  6 each Topical Daily   heparin  5,000 Units Subcutaneous Q8H   hydrALAZINE  50 mg Oral Q8H   Continuous Infusions:     LOS: 2 days    Time spent: 55 minutes    Verdia Kuba, MD [facility]   To contact the attending provider between 7A-7P or the covering provider during after hours 7P-7A, please log into the web site [email or web address] and access using universal [place] password for that web site. If you do not have the password, please call the hospital operator.  01/27/2022, 1:01 PM

## 2022-01-27 NOTE — Consult Note (Addendum)
Cardiology Consultation:   Patient ID: Robin Jarvis MRN: [id number]; DOB: 05/30/1976  Admit date: 01/25/2022 Date of Consult: 01/27/2022  PCP:  System, Provider Not In   [place] Providers Cardiologist:  New to [facility] - Dr. Radford Pax   Patient Profile:   Robin Jarvis is a 46 y.o. female with a hx of HTN who is being seen 01/27/2022 for the evaluation of dilated cardiomyopathy in the setting of hypertensive emergency at the request of Dr. Rowe Pavy.  History of Present Illness:   Robin Jarvis is a pleasant 46 year old female with past medical history of hypertension.  She has a history of gestational hypertension in the past, blood pressure issue always resolved with delivery.  Her last children was born in 2004, she noted her blood pressure remained elevated even after C-section.  She has been taking various blood pressure medication ever since.  Unfortunately she does not have a fixed primary care provider.  Over the years, she has been in the emergency room multiple times due to elevated blood pressure including March 2017, June 2017, July 2018, July 2020, March 2021, and June 2022.  She has never been seen by cardiology service before.  Previous echocardiogram in September 2009 demonstrated EF 65 to 75%, moderately to significantly thickened LV.  Repeat echocardiogram in March 2021 showed EF 50 to 55%, severe LVH, grade 1 DD, moderate hypokinesis of the LV entire septal wall, moderately elevated PA systolic pressure, trivial MR.  Renal artery Doppler obtained on 11/12/2019 showed normal-sized kidneys, no evidence of right or left renal artery stenosis, abnormal right resistive index.  Previous secondary hypertension work-up include aldosterone, plasma renin activity (PRA), and aldosterone/PRA ratio in March 2021 which were all normal.  TSH normal in March 2021.  Mildly elevated 24-hour normetanephrine and metanephrine on 11/13/2019.  Repeat aldosterone level, aldosterone/PRA and PRA test in April 2021  demonstrated mildly elevated PRA of 8.2 (normal <5.38).  Previous blood pressure medication include amlodipine, clonidine, hydralazine, hydrochlorothiazide, however patient says she was never on all for blood pressure medication at the same time.  The last time she has taken any of her blood pressure medication was over a year ago.  She was unable to obtain refill due to financial reason.  Over the past 2 to 3 months, she has been having increasing dyspnea on exertion.  Symptom worsened to the degree she eventually sought medical attention on 01/25/2022.  Prior to coming to the hospital, patient did experience substernal chest pressure with physical activity or emotional stress.  She also had headaches as well.  On arrival to the ED, her blood pressure was 230/127.  Creatinine 1.16.  Potassium 2.8.  BMP at 1003.  Serial troponin 36 --> 34.  EKG demonstrated sinus rhythm, LVH, but no significant ST-T wave changes.  Since the patient was admitted, amlodipine, hydralazine and labetalol were restarted.  Blood pressure is now down to 140-170s range.  Repeat aldosterone renin ratio is currently pending.  CTA of chest abdomen and pelvis was negative for dissection.  Echocardiogram was repeated on 01/26/2022 which showed EF 40%, grade 2 DD, global hypokinesis, mild MR, moderate to severe TR.     Past Medical History:  Diagnosis Date   Herpes    Hypertension    Migraine     Past Surgical History:  Procedure Laterality Date   CESAREAN SECTION     TUBAL LIGATION       Home Medications:  Prior to Admission medications   Medication Sig Start Date End Date  Taking? Authorizing Provider  acetaminophen (TYLENOL) 500 MG tablet Take 1,000 mg by mouth every 6 (six) hours as needed for mild pain.   Yes [provider]  Multiple Vitamin (MULTIVITAMIN WITH MINERALS) TABS tablet Take 1 tablet by mouth daily.   Yes [provider]  amLODipine (NORVASC) 10 MG tablet Take 1 tablet (10 mg total) by  mouth daily. Patient not taking: Reported on 01/25/2022 11/15/19   Dimple Nanas, MD  amLODipine (NORVASC) 10 MG tablet Take 1 tablet (10 mg total) by mouth daily. 02/08/21 03/10/21  Gailen Shelter, PA  cloNIDine (CATAPRES) 0.2 MG tablet Take 1 tablet (0.2 mg total) by mouth 3 (three) times daily. 11/14/19 12/14/19  Amin, Loura Halt, MD  hydrALAZINE (APRESOLINE) 100 MG tablet Take 1 tablet (100 mg total) by mouth 3 (three) times daily. 11/14/19 12/14/19  Amin, Loura Halt, MD  hydrochlorothiazide (HYDRODIURIL) 25 MG tablet Take 1 tablet (25 mg total) by mouth daily. 02/08/21 03/10/21  Gailen Shelter, PA  potassium chloride (KLOR-CON) 10 MEQ tablet Take 2 tablets (20 mEq total) by mouth 2 (two) times daily for 5 days. 02/08/21 02/13/21  Gailen Shelter, PA  potassium chloride SA (KLOR-CON) 20 MEQ tablet Take 2 tablets (40 mEq total) by mouth 2 (two) times daily for 3 doses. 11/14/19 11/16/19  Dimple Nanas, MD    Inpatient Medications: Scheduled Meds:  amLODipine  10 mg Oral Daily   Chlorhexidine Gluconate Cloth  6 each Topical Daily   heparin  5,000 Units Subcutaneous Q8H   hydrALAZINE  50 mg Oral Q8H   labetalol  100 mg Oral Q8H   Continuous Infusions:  PRN Meds: acetaminophen, docusate sodium, hydrALAZINE, labetalol, melatonin, ondansetron (ZOFRAN) IV, polyethylene glycol, white petrolatum  Allergies:    Allergies  Allergen Reactions   Lisinopril Swelling    Social History:   Social History   Socioeconomic History   Marital status: Single    Spouse name: Not on file   Number of children: Not on file   Years of education: Not on file   Highest education level: Not on file  Occupational History   Not on file  Tobacco Use   Smoking status: Never   Smokeless tobacco: Never  Substance and Sexual Activity   Alcohol use: Yes    Alcohol/week: 4.0 standard drinks of alcohol    Types: 4 Glasses of wine per week    Comment: occ   Drug use: No   Sexual activity: Yes  Other Topics  Concern   Not on file  Social History Narrative   Not on file   Social Determinants of Health   Financial Resource Strain: Not on file  Food Insecurity: Not on file  Transportation Needs: Not on file  Physical Activity: Not on file  Stress: Not on file  Social Connections: Not on file  Intimate Partner Violence: Not on file    Family History:    Family History  Problem Relation Age of Onset   HIV/AIDS Mother    HIV/AIDS Father    Hypertension Maternal Aunt    Hyperlipidemia Maternal Aunt    Diabetes Maternal Aunt      ROS:  Please see the history of present illness.   All other ROS reviewed and negative.     Physical Exam/Data:   Vitals:   01/27/22 0730 01/27/22 0745 01/27/22 0753 01/27/22 0800  BP:      Pulse: 70 68  68  Resp: 13 16  17   Temp:  98.8 F (37.1 C)   TempSrc:   Oral   SpO2: 99% 99%  97%  Weight:      Height:        Intake/Output Summary (Last 24 hours) at 01/27/2022 0854 Last data filed at 01/27/2022 0800 Gross per 24 hour  Intake 1971.48 ml  Output --  Net 1971.48 ml      01/25/2022    9:45 AM 02/08/2021    4:25 PM 11/14/2019    3:55 AM  Last 3 Weights  Weight (lbs) 240 lb 245 lb 233 lb  Weight (kg) 108.863 kg 111.131 kg 105.688 kg     Body mass index is 35.44 kg/m.  General:  Well nourished, well developed, in no acute distress HEENT: normal Neck: no JVD Vascular: No carotid bruits; Distal pulses 2+ bilaterally Cardiac:  normal S1, S2; RRR; no murmur  Lungs:  clear to auscultation bilaterally, no wheezing, rhonchi or rales  Abd: soft, nontender, no hepatomegaly  Ext: no edema Musculoskeletal:  No deformities, BUE and BLE strength normal and equal Skin: warm and dry  Neuro:  CNs 2-12 intact, no focal abnormalities noted Psych:  Normal affect   EKG:  The EKG was personally reviewed and demonstrates: Normal sinus rhythm, LVH, no significant ST-T wave change Telemetry:  Telemetry was personally reviewed and demonstrates: Normal  sinus rhythm, no significant ventricular ectopy.  Relevant CV Studies:   Echo 01/26/2022 1. Left ventricular ejection fraction, by estimation, is 40%. The left  ventricle has mildly decreased function. The left ventricle demonstrates  global hypokinesis. The left ventricular internal cavity size was mildly  dilated. There is severe left  ventricular hypertrophy. Left ventricular diastolic parameters are  consistent with Grade II diastolic dysfunction (pseudonormalization).  Elevated left ventricular end-diastolic pressure.   2. Right ventricular systolic function is normal. The right ventricular  size is normal. There is moderately elevated pulmonary artery systolic  pressure.   3. Left atrial size was severely dilated.   4. The pericardial effusion is posterior to the left ventricle.   5. The mitral valve is abnormal. Mild mitral valve regurgitation. No  evidence of mitral stenosis.   6. Tricuspid valve regurgitation is moderate to severe.   7. The aortic valve is tricuspid. There is mild calcification of the  aortic valve. There is mild thickening of the aortic valve. Aortic valve  regurgitation is trivial. Aortic valve sclerosis is present, with no  evidence of aortic valve stenosis.   8. The inferior vena cava is dilated in size with <50% respiratory  variability, suggesting right atrial pressure of 15 mmHg.   Laboratory Data:  High Sensitivity Troponin:   Recent Labs  Lab 01/25/22 1000 01/25/22 1212  TROPONINIHS 36* 34*     Chemistry Recent Labs  Lab 01/25/22 1814 01/26/22 0324 01/27/22 0225  NA 138 135 135  K 3.0* 3.3* 3.8  CL 98 100 103  CO2 26 24 24   GLUCOSE 128* 113* 112*  BUN 12 14 14   CREATININE 1.18* 1.27* 1.08*  CALCIUM 8.9 8.3* 8.2*  MG  --  1.7 2.2  GFRNONAA 59* 54* >60  ANIONGAP 14 11 8     No results for input(s): "PROT", "ALBUMIN", "AST", "ALT", "ALKPHOS", "BILITOT" in the last 168 hours. Lipids  Recent Labs  Lab 01/25/22 1814  CHOL 151   TRIG 75  HDL 36*  LDLCALC 100*  CHOLHDL 4.2    Hematology Recent Labs  Lab 01/25/22 1000 01/26/22 0324 01/27/22 0225  WBC 12.0* 14.5* 11.3*  RBC 4.64 4.14 4.25  HGB 14.4 12.9 13.2  HCT 42.2 38.2 40.4  MCV 90.9 92.3 95.1  MCH 31.0 31.2 31.1  MCHC 34.1 33.8 32.7  RDW 14.3 14.6 15.1  PLT 251 265 273   Thyroid No results for input(s): "TSH", "FREET4" in the last 168 hours.  BNP Recent Labs  Lab 01/25/22 1000  BNP 1,003.2*    DDimer No results for input(s): "DDIMER" in the last 168 hours.   Radiology/Studies:  ECHOCARDIOGRAM COMPLETE  Result Date: 01/26/2022    ECHOCARDIOGRAM REPORT   Patient Name:   Robin Jarvis Date of Exam: 01/26/2022 Medical Rec #:  [id number]   Height:       69.0 in Accession #:    [id number]  Weight:       240.0 lb Date of Birth:  July 04, 1976   BSA:          2.232 m Patient Age:    51 years    BP:           153/89 mmHg Patient Gender: F           HR:           72 bpm. Exam Location:  Inpatient Procedure: 2D Echo, Cardiac Doppler, Color Doppler and Strain Analysis Indications:    CHF  History:        Patient has prior history of Echocardiogram examinations, most                 recent 11/10/2019. Cardiomegaly, Signs/Symptoms:Chest Pain; Risk                 Factors:Hypertension.  Sonographer:    Merrie Roof RDCS Referring Phys: [place]  1. Left ventricular ejection fraction, by estimation, is 40%. The left ventricle has mildly decreased function. The left ventricle demonstrates global hypokinesis. The left ventricular internal cavity size was mildly dilated. There is severe left ventricular hypertrophy. Left ventricular diastolic parameters are consistent with Grade II diastolic dysfunction (pseudonormalization). Elevated left ventricular end-diastolic pressure.  2. Right ventricular systolic function is normal. The right ventricular size is normal. There is moderately elevated pulmonary artery systolic pressure.  3. Left atrial size was  severely dilated.  4. The pericardial effusion is posterior to the left ventricle.  5. The mitral valve is abnormal. Mild mitral valve regurgitation. No evidence of mitral stenosis.  6. Tricuspid valve regurgitation is moderate to severe.  7. The aortic valve is tricuspid. There is mild calcification of the aortic valve. There is mild thickening of the aortic valve. Aortic valve regurgitation is trivial. Aortic valve sclerosis is present, with no evidence of aortic valve stenosis.  8. The inferior vena cava is dilated in size with <50% respiratory variability, suggesting right atrial pressure of 15 mmHg. FINDINGS  Left Ventricle: Left ventricular ejection fraction, by estimation, is 40%. The left ventricle has mildly decreased function. The left ventricle demonstrates global hypokinesis. The left ventricular internal cavity size was mildly dilated. There is severe left ventricular hypertrophy. Left ventricular diastolic parameters are consistent with Grade II diastolic dysfunction (pseudonormalization). Elevated left ventricular end-diastolic pressure. Right Ventricle: The right ventricular size is normal. No increase in right ventricular wall thickness. Right ventricular systolic function is normal. There is moderately elevated pulmonary artery systolic pressure. The tricuspid regurgitant velocity is 3.09 m/s, and with an assumed right atrial pressure of 15 mmHg, the estimated right ventricular systolic pressure is [id number] mmHg. Left Atrium: Left atrial size was severely dilated. Right Atrium:  Right atrial size was normal in size. Pericardium: Trivial pericardial effusion is present. The pericardial effusion is posterior to the left ventricle. Mitral Valve: The mitral valve is abnormal. There is mild thickening of the mitral valve leaflet(s). Mild mitral valve regurgitation. No evidence of mitral valve stenosis. Tricuspid Valve: The tricuspid valve is normal in structure. Tricuspid valve regurgitation is moderate to  severe. No evidence of tricuspid stenosis. Aortic Valve: The aortic valve is tricuspid. There is mild calcification of the aortic valve. There is mild thickening of the aortic valve. Aortic valve regurgitation is trivial. Aortic valve sclerosis is present, with no evidence of aortic valve stenosis. Pulmonic Valve: The pulmonic valve was normal in structure. Pulmonic valve regurgitation is mild. No evidence of pulmonic stenosis. Aorta: The aortic root is normal in size and structure. Venous: The inferior vena cava is dilated in size with less than 50% respiratory variability, suggesting right atrial pressure of 15 mmHg. IAS/Shunts: No atrial level shunt detected by color flow Doppler.  LEFT VENTRICLE PLAX 2D LVIDd:         4.50 cm      Diastology LVIDs:         3.20 cm      LV e' medial:    5.77 cm/s LV PW:         1.70 cm      LV E/e' medial:  19.4 LV IVS:        1.70 cm      LV e' lateral:   8.81 cm/s LVOT diam:     2.10 cm      LV E/e' lateral: 12.7 LV SV:         58 LV SV Index:   26 LVOT Area:     3.46 cm  LV Volumes (MOD) LV vol d, MOD A2C: 152.0 ml LV vol d, MOD A4C: 128.0 ml LV vol s, MOD A2C: 78.6 ml LV vol s, MOD A4C: 83.5 ml LV SV MOD A2C:     73.4 ml LV SV MOD A4C:     128.0 ml LV SV MOD BP:      58.4 ml RIGHT VENTRICLE            IVC RV Basal diam:  4.30 cm    IVC diam: 2.50 cm RV Mid diam:    3.10 cm RV S prime:     9.79 cm/s TAPSE (M-mode): 1.6 cm LEFT ATRIUM              Index        RIGHT ATRIUM           Index LA diam:        5.30 cm  2.37 cm/m   RA Area:     29.40 cm LA Vol (A2C):   149.0 ml 66.74 ml/m  RA Volume:   109.00 ml 48.83 ml/m LA Vol (A4C):   87.6 ml  39.24 ml/m LA Biplane Vol: 118.0 ml 52.86 ml/m  AORTIC VALVE LVOT Vmax:   105.00 cm/s LVOT Vmean:  63.900 cm/s LVOT VTI:    0.168 m  AORTA Ao Root diam: 3.20 cm Ao Asc diam:  3.40 cm MITRAL VALVE                TRICUSPID VALVE MV Area (PHT): 6.32 cm     TR Peak grad:   38.2 mmHg MV Decel Time: 120 msec     TR Vmax:        309.00 cm/s  MV E velocity: 112.00 cm/s MV A velocity: 34.60 cm/s   SHUNTS MV E/A ratio:  3.24         Systemic VTI:  0.17 m                             Systemic Diam: 2.10 cm Jenkins Rouge MD Electronically signed by Jenkins Rouge MD Signature Date/Time: 01/26/2022/11:23:36 AM    Final    CT Angio Chest/Abd/Pel for Dissection W and/or W/WO  Result Date: 01/25/2022 CLINICAL DATA:  Chest pain and back pain. Aortic dissection suspected. EXAM: CT ANGIOGRAPHY CHEST, ABDOMEN AND PELVIS TECHNIQUE: 11/10/2019 Multidetector CT imaging through the chest, abdomen and pelvis was performed using the standard protocol during bolus administration of intravenous contrast. Multiplanar reconstructed images and MIPs were obtained and reviewed to evaluate the vascular anatomy. RADIATION DOSE REDUCTION: This exam was performed according to the departmental dose-optimization program which includes automated exposure control, adjustment of the mA and/or kV according to patient size and/or use of iterative reconstruction technique. CONTRAST:  139mL OMNIPAQUE IOHEXOL 350 MG/ML SOLN COMPARISON:  11/10/2019 FINDINGS: CTA CHEST FINDINGS Cardiovascular: Cardiomegaly. Small amount of pericardial fluid. Global cardiac enlargement. No visible coronary artery calcification or aortic atherosclerotic calcification. No aortic dissection. Prominent pulmonary arterial tree consistent with a degree of pulmonary arterial hypertension. Diameter of the main pulmonary artery measures 3.6 cm. No pulmonary emboli. Mediastinum/Nodes: Mild prominence of the right paratracheal lymph nodes. Similar appearance to the study of 2021 favors benign nature. Lungs/Pleura: Small pleural effusion on the left layering dependently. No definite pulmonary edema. No infiltrate, mass or nodule. Musculoskeletal: Negative Review of the MIP images confirms the above findings. CTA ABDOMEN AND PELVIS FINDINGS VASCULAR Aorta: Minimal atherosclerotic change of the infrarenal abdominal aorta. No  aneurysm. No dissection. Celiac: Normal SMA: Normal Renals: Normal IMA: Normal Inflow: Normal Veins: Normal as seen without opacification. Review of the MIP images confirms the above findings. NON-VASCULAR Hepatobiliary: No liver parenchymal abnormality seen. No calcified gallstones. Pancreas: Normal Spleen: Normal Adrenals/Urinary Tract: Adrenal glands are normal. Kidneys are normal. Bladder is normal. Stomach/Bowel: Stomach and small intestine are normal. Normal appendix. Normal colon. Lymphatic: No adenopathy. Reproductive: Normal. Other: No free fluid or air. Musculoskeletal: Ordinary mild lower lumbar degenerative changes. Review of the MIP images confirms the above findings. IMPRESSION: No aortic dissection or acute vascular pathology. There is only minimal atherosclerotic change of the infrarenal abdominal aorta. Global cardiomegaly. Small pericardial effusion. Prominent main pulmonary artery suggesting a degree of pulmonary arterial hypertension. Small left effusion layering dependently. Chronic mild prominence of the paratracheal lymph nodes, unchanged since 2021 and therefore probably not significant. No acute abdominal or in finding. Electronically Signed   By: Nelson Chimes M.D.   On: 01/25/2022 15:56   DG Chest 2 View  Result Date: 01/25/2022 CLINICAL DATA:  Chest pain EXAM: CHEST - 2 VIEW COMPARISON:  02/08/2021 FINDINGS: Transverse diameter of heart is increased. Central pulmonary vessels are prominent. There are no signs of alveolar pulmonary edema or focal pulmonary consolidation. There is no significant pleural effusion or pneumothorax. IMPRESSION: Cardiomegaly. Central pulmonary vessels are more prominent suggesting possible mild CHF. There are no signs of alveolar pulmonary edema. There is no focal pulmonary consolidation. Electronically Signed   By: Elmer Picker M.D.   On: 01/25/2022 10:24     Assessment and Plan:   Dilated cardiomyopathy:  -Although patient had exertional chest  pain prior to admission, most likely cause for her  dilated cardiomyopathy is related to chronically uncontrolled high blood pressure.  Her chest discomfort is also associated with emotional stress as well.  Will discuss with MD, potentially consider coronary CT to further assess.  -EF 40 to 45% on echocardiogram during this admission.  Previous echo in 2021 showed EF 50 to 55%.  Echocardiogram in 2009 showed EF 60 to 65%.  -We will need aggressive heart failure medication titration.  Patient had history of tubal ligation and is no longer considering further pregnancy.  Therefore we will start the patient on Entresto 24-26 mg twice a day.  Consider addition of Imdur while reducing amlodipine in the future.  In the past, she was also placed on clonidine, would not recommend clonidine therapy in this patient with possible compliance issue.  -Repeat echocardiogram in 3 months if able to control blood pressure.  In the future, if blood pressure remain difficult to control despite compliance with medications, we can think about renal artery denervation procedure, however prior to that she will need to demonstrate compliance with medication first.  Hypertensive emergency   -Patient has not taken any of her blood pressure medication for over a year.  She does not have a PCP.  -She underwent for secondary hypertension work-up during hospitalization in March and April 2021.  Although initial aldosterone renin ratio, aldosterone and renin activity were normal, repeat study a few days later showed mildly elevated renin activity with normal ratio.  Renal artery Doppler at the time was negative for significant blockage.  24-hour urine was obtained prior to discharge which came back later showing borderline elevated no metanephrine and metanephrine level.  Will review lab work with MD.  3.   Chest pain: Chest pain occurs both with exertion and with emotional stress.  Serial troponin borderline elevated and remained flat,  likely demand ischemia in the setting of hypertensive emergency.  4. Hypokalemia: improving   Risk Assessment/Risk Scores:     HEAR Score (for undifferentiated chest pain):     New York Heart Association (NYHA) Functional Class NYHA Class II        For questions or updates, please contact [facility] Please consult [email or web address] for contact info under    Hilbert Corrigan, [place]  01/27/2022 8:54 AM

## 2022-01-28 DIAGNOSIS — I5042 Chronic combined systolic (congestive) and diastolic (congestive) heart failure: Secondary | ICD-10-CM

## 2022-01-28 LAB — CBC
HCT: 36.5 % (ref 36.0–46.0)
Hemoglobin: 12.2 g/dL (ref 12.0–15.0)
MCH: 31.8 pg (ref 26.0–34.0)
MCHC: 33.4 g/dL (ref 30.0–36.0)
MCV: 95.1 fL (ref 80.0–100.0)
Platelets: 244 10*3/uL (ref 150–400)
RBC: 3.84 MIL/uL — ABNORMAL LOW (ref 3.87–5.11)
RDW: 14.6 % (ref 11.5–15.5)
WBC: 12.1 10*3/uL — ABNORMAL HIGH (ref 4.0–10.5)
nRBC: 0 % (ref 0.0–0.2)

## 2022-01-28 LAB — BASIC METABOLIC PANEL
Anion gap: 6 (ref 5–15)
Anion gap: 8 (ref 5–15)
BUN: 17 mg/dL (ref 6–20)
BUN: 15 mg/dL (ref 6–20)
CO2: 23 mmol/L (ref 22–32)
CO2: 23 mmol/L (ref 22–32)
Calcium: 7.8 mg/dL — ABNORMAL LOW (ref 8.9–10.3)
Calcium: 8.1 mg/dL — ABNORMAL LOW (ref 8.9–10.3)
Chloride: 105 mmol/L (ref 98–111)
Chloride: 104 mmol/L (ref 98–111)
Creatinine, Ser: 1.23 mg/dL — ABNORMAL HIGH (ref 0.44–1.00)
Creatinine, Ser: 1.09 mg/dL — ABNORMAL HIGH (ref 0.44–1.00)
GFR, Estimated: 56 mL/min — ABNORMAL LOW (ref >60)
GFR, Estimated: >60 mL/min (ref >60)
Glucose, Bld: 96 mg/dL (ref 70–99)
Glucose, Bld: 92 mg/dL (ref 70–99)
Potassium: 4 mmol/L (ref 3.5–5.1)
Potassium: 3.8 mmol/L (ref 3.5–5.1)
Sodium: 134 mmol/L — ABNORMAL LOW (ref 135–145)
Sodium: 135 mmol/L (ref 135–145)

## 2022-01-28 LAB — MAGNESIUM: Magnesium: 2 mg/dL (ref 1.7–2.4)

## 2022-01-28 MED ORDER — POTASSIUM CHLORIDE CRYS ER 20 MEQ PO TBCR
20.0000 meq | EXTENDED_RELEASE_TABLET | Freq: Once | ORAL | Status: AC
  Administered 2022-01-28: 20 meq via ORAL
  Filled 2022-01-28: qty 1

## 2022-01-28 MED ORDER — METOPROLOL TARTRATE 100 MG PO TABS: 100.0000 mg | ORAL_TABLET | Freq: Once | ORAL | Status: DC | PRN

## 2022-01-28 MED ORDER — ISOSORB DINITRATE-HYDRALAZINE 20-37.5 MG PO TABS
1.0000 | ORAL_TABLET | Freq: Once | ORAL | Status: AC
Start: 2022-01-28 — End: 2022-01-28
  Administered 2022-01-28: 1 via ORAL
  Filled 2022-01-28: qty 1

## 2022-01-28 MED ORDER — DAPAGLIFLOZIN PROPANEDIOL 5 MG PO TABS
5.0000 mg | ORAL_TABLET | Freq: Every day | ORAL | Status: DC
  Administered 2022-01-28: 5 mg via ORAL
  Filled 2022-01-28: qty 1

## 2022-01-28 MED ORDER — SPIRONOLACTONE 25 MG PO TABS
25.0000 mg | ORAL_TABLET | Freq: Every day | ORAL | Status: DC
  Administered 2022-01-29 – 2022-01-31 (×3): 25 mg via ORAL
  Filled 2022-01-28 (×3): qty 1

## 2022-01-28 MED ORDER — DAPAGLIFLOZIN PROPANEDIOL 10 MG PO TABS
10.0000 mg | ORAL_TABLET | Freq: Every day | ORAL | Status: DC
  Administered 2022-01-29 – 2022-02-02 (×5): 10 mg via ORAL
  Filled 2022-01-28 (×5): qty 1

## 2022-01-28 MED ORDER — ISOSORB DINITRATE-HYDRALAZINE 20-37.5 MG PO TABS
2.0000 | ORAL_TABLET | Freq: Two times a day (BID) | ORAL | Status: DC
  Administered 2022-01-28 – 2022-01-30 (×4): 2 via ORAL
  Filled 2022-01-28 (×4): qty 2

## 2022-01-28 NOTE — Progress Notes (Signed)
Monitor tech notified patient had 6 beats of VT. Patient sleeping, denied symptoms. BP 146/97, HR 56, SB.  D. Dunn PA notified.

## 2022-01-28 NOTE — Progress Notes (Addendum)
Progress Note  Patient Name: Robin Jarvis Date of Encounter: 01/28/2022  [facility] Cardiologist: None   Subjective   Denies any chest pain or shortness of breath.  BP remains markedly elevated at 195/111 mmHg this morning.    Inpatient Medications    Scheduled Meds:  amLODipine  10 mg Oral Daily   carvedilol  18.75 mg Oral BID WC   dapagliflozin propanediol  5 mg Oral Daily   heparin  5,000 Units Subcutaneous Q8H   isosorbide-hydrALAZINE  1 tablet Oral Once   isosorbide-hydrALAZINE  2 tablet Oral BID   [START ON 01/29/2022] spironolactone  25 mg Oral Daily   Continuous Infusions:  PRN Meds: acetaminophen, docusate sodium, hydrALAZINE, melatonin, ondansetron (ZOFRAN) IV, polyethylene glycol, white petrolatum   Vital Signs    Vitals:   01/28/22 0239 01/28/22 0625 01/28/22 0626 01/28/22 0831  BP: (!) 157/84  (!) 178/110 (!) 195/111  Pulse:  71  63  Resp:    18  Temp:  98 F (36.7 C)  98.4 F (36.9 C)  TempSrc:  Oral  Oral  SpO2:  99%  100%  Weight:      Height:        Intake/Output Summary (Last 24 hours) at 01/28/2022 0924 Last data filed at 01/28/2022 0143 Gross per 24 hour  Intake 970 ml  Output --  Net 970 ml      01/28/2022    1:00 AM 01/25/2022    9:45 AM 02/08/2021    4:25 PM  Last 3 Weights  Weight (lbs) 250 lb 3.6 oz 240 lb 245 lb  Weight (kg) 113.5 kg 108.863 kg 111.131 kg      Telemetry    Normal sinus rhythm- Personally Reviewed  ECG    No new EKG to review- Personally Reviewed  Physical Exam   GEN: No acute distress.   Neck: No JVD Cardiac: RRR, no murmurs, rubs, or gallops.  Respiratory: Clear to auscultation bilaterally. GI: Soft, nontender, non-distended  MS: No edema; No deformity. Neuro:  Nonfocal  Psych: Normal affect   Labs    High Sensitivity Troponin:   Recent Labs  Lab 01/25/22 1000 01/25/22 1212  TROPONINIHS 36* 34*      Chemistry Recent Labs  Lab 01/26/22 0324 01/27/22 0225 01/28/22 0001  NA 135 135  134*  K 3.3* 3.8 4.0  CL 100 103 105  CO2 24 24 23   GLUCOSE 113* 112* 96  BUN 14 14 17   CREATININE 1.27* 1.08* 1.23*  CALCIUM 8.3* 8.2* 7.8*  GFRNONAA 54* >60 56*  ANIONGAP 11 8 6      Hematology Recent Labs  Lab 01/26/22 0324 01/27/22 0225 01/28/22 0001  WBC 14.5* 11.3* 12.1*  RBC 4.14 4.25 3.84*  HGB 12.9 13.2 12.2  HCT 38.2 40.4 36.5  MCV 92.3 95.1 95.1  MCH 31.2 31.1 31.8  MCHC 33.8 32.7 33.4  RDW 14.6 15.1 14.6  PLT 265 273 244    BNP Recent Labs  Lab 01/25/22 1000  BNP 1,003.2*     DDimer No results for input(s): "DDIMER" in the last 168 hours.   Radiology    DG Chest Port 1 View  Result Date: 01/27/2022 CLINICAL DATA:  Dyspnea. EXAM: PORTABLE CHEST 1 VIEW COMPARISON:  01/25/2022 FINDINGS: Lungs are adequately inflated without airspace consolidation, effusion or pneumothorax. Mild stable cardiomegaly. Remainder of the exam is unchanged. IMPRESSION: 1. No acute findings. 2. Mild stable cardiomegaly. Electronically Signed   By: Marin Olp M.D.   On: 01/27/2022  10:07   ECHOCARDIOGRAM COMPLETE  Result Date: 01/26/2022    ECHOCARDIOGRAM REPORT   Patient Name:   Robin Jarvis Date of Exam: 01/26/2022 Medical Rec #:  [id number]   Height:       69.0 in Accession #:    [id number]  Weight:       240.0 lb Date of Birth:  February 27, 1976   BSA:          2.232 m Patient Age:    45 years    BP:           153/89 mmHg Patient Gender: F           HR:           72 bpm. Exam Location:  Inpatient Procedure: 2D Echo, Cardiac Doppler, Color Doppler and Strain Analysis Indications:    CHF  History:        Patient has prior history of Echocardiogram examinations, most                 recent 11/10/2019. Cardiomegaly, Signs/Symptoms:Chest Pain; Risk                 Factors:Hypertension.  Sonographer:    Roosvelt Maser RDCS Referring Phys: [id number] Tobey Grim IMPRESSIONS  1. Left ventricular ejection fraction, by estimation, is 40%. The left ventricle has mildly decreased function. The left  ventricle demonstrates global hypokinesis. The left ventricular internal cavity size was mildly dilated. There is severe left ventricular hypertrophy. Left ventricular diastolic parameters are consistent with Grade II diastolic dysfunction (pseudonormalization). Elevated left ventricular end-diastolic pressure.  2. Right ventricular systolic function is normal. The right ventricular size is normal. There is moderately elevated pulmonary artery systolic pressure.  3. Left atrial size was severely dilated.  4. The pericardial effusion is posterior to the left ventricle.  5. The mitral valve is abnormal. Mild mitral valve regurgitation. No evidence of mitral stenosis.  6. Tricuspid valve regurgitation is moderate to severe.  7. The aortic valve is tricuspid. There is mild calcification of the aortic valve. There is mild thickening of the aortic valve. Aortic valve regurgitation is trivial. Aortic valve sclerosis is present, with no evidence of aortic valve stenosis.  8. The inferior vena cava is dilated in size with <50% respiratory variability, suggesting right atrial pressure of 15 mmHg. FINDINGS  Left Ventricle: Left ventricular ejection fraction, by estimation, is 40%. The left ventricle has mildly decreased function. The left ventricle demonstrates global hypokinesis. The left ventricular internal cavity size was mildly dilated. There is severe left ventricular hypertrophy. Left ventricular diastolic parameters are consistent with Grade II diastolic dysfunction (pseudonormalization). Elevated left ventricular end-diastolic pressure. Right Ventricle: The right ventricular size is normal. No increase in right ventricular wall thickness. Right ventricular systolic function is normal. There is moderately elevated pulmonary artery systolic pressure. The tricuspid regurgitant velocity is 3.09 m/s, and with an assumed right atrial pressure of 15 mmHg, the estimated right ventricular systolic pressure is 53.2 mmHg. Left  Atrium: Left atrial size was severely dilated. Right Atrium: Right atrial size was normal in size. Pericardium: Trivial pericardial effusion is present. The pericardial effusion is posterior to the left ventricle. Mitral Valve: The mitral valve is abnormal. There is mild thickening of the mitral valve leaflet(s). Mild mitral valve regurgitation. No evidence of mitral valve stenosis. Tricuspid Valve: The tricuspid valve is normal in structure. Tricuspid valve regurgitation is moderate to severe. No evidence of tricuspid stenosis. Aortic Valve: The aortic valve is tricuspid. There is  mild calcification of the aortic valve. There is mild thickening of the aortic valve. Aortic valve regurgitation is trivial. Aortic valve sclerosis is present, with no evidence of aortic valve stenosis. Pulmonic Valve: The pulmonic valve was normal in structure. Pulmonic valve regurgitation is mild. No evidence of pulmonic stenosis. Aorta: The aortic root is normal in size and structure. Venous: The inferior vena cava is dilated in size with less than 50% respiratory variability, suggesting right atrial pressure of 15 mmHg. IAS/Shunts: No atrial level shunt detected by color flow Doppler.  LEFT VENTRICLE PLAX 2D LVIDd:         4.50 cm      Diastology LVIDs:         3.20 cm      LV e' medial:    5.77 cm/s LV PW:         1.70 cm      LV E/e' medial:  19.4 LV IVS:        1.70 cm      LV e' lateral:   8.81 cm/s LVOT diam:     2.10 cm      LV E/e' lateral: 12.7 LV SV:         58 LV SV Index:   26 LVOT Area:     3.46 cm  LV Volumes (MOD) LV vol d, MOD A2C: 152.0 ml LV vol d, MOD A4C: 128.0 ml LV vol s, MOD A2C: 78.6 ml LV vol s, MOD A4C: 83.5 ml LV SV MOD A2C:     73.4 ml LV SV MOD A4C:     128.0 ml LV SV MOD BP:      58.4 ml RIGHT VENTRICLE            IVC RV Basal diam:  4.30 cm    IVC diam: 2.50 cm RV Mid diam:    3.10 cm RV S prime:     9.79 cm/s TAPSE (M-mode): 1.6 cm LEFT ATRIUM              Index        RIGHT ATRIUM           Index LA  diam:        5.30 cm  2.37 cm/m   RA Area:     29.40 cm LA Vol (A2C):   149.0 ml 66.74 ml/m  RA Volume:   109.00 ml 48.83 ml/m LA Vol (A4C):   87.6 ml  39.24 ml/m LA Biplane Vol: 118.0 ml 52.86 ml/m  AORTIC VALVE LVOT Vmax:   105.00 cm/s LVOT Vmean:  63.900 cm/s LVOT VTI:    0.168 m  AORTA Ao Root diam: 3.20 cm Ao Asc diam:  3.40 cm MITRAL VALVE                TRICUSPID VALVE MV Area (PHT): 6.32 cm     TR Peak grad:   38.2 mmHg MV Decel Time: 120 msec     TR Vmax:        309.00 cm/s MV E velocity: 112.00 cm/s MV A velocity: 34.60 cm/s   SHUNTS MV E/A ratio:  3.24         Systemic VTI:  0.17 m                             Systemic Diam: 2.10 cm Jenkins Rouge MD Electronically signed by Jenkins Rouge MD Signature Date/Time: 01/26/2022/11:23:36 AM  Final     Cardiac Studies   2D echo 01/26/2022 IMPRESSIONS    1. Left ventricular ejection fraction, by estimation, is 40%. The left  ventricle has mildly decreased function. The left ventricle demonstrates  global hypokinesis. The left ventricular internal cavity size was mildly  dilated. There is severe left  ventricular hypertrophy. Left ventricular diastolic parameters are  consistent with Grade II diastolic dysfunction (pseudonormalization).  Elevated left ventricular end-diastolic pressure.   2. Right ventricular systolic function is normal. The right ventricular  size is normal. There is moderately elevated pulmonary artery systolic  pressure.   3. Left atrial size was severely dilated.   4. The pericardial effusion is posterior to the left ventricle.   5. The mitral valve is abnormal. Mild mitral valve regurgitation. No  evidence of mitral stenosis.   6. Tricuspid valve regurgitation is moderate to severe.   7. The aortic valve is tricuspid. There is mild calcification of the  aortic valve. There is mild thickening of the aortic valve. Aortic valve  regurgitation is trivial. Aortic valve sclerosis is present, with no  evidence of aortic  valve stenosis.   8. The inferior vena cava is dilated in size with <50% respiratory  variability, suggesting right atrial pressure of 15 mmHg.   Patient Profile     46 y.o. female  with a hx of HTN who is being seen 01/27/2022 for the evaluation of dilated cardiomyopathy in the setting of hypertensive emergency at the request of Dr. Linna Darner.  Assessment & Plan    Dilated cardiomyopathy/chronic combined systolic/diastolic CHF -EF 63% on recent echo -This is likely hypertension driven in etiology -Needs aggressive control of blood pressure -Continue Farxiga 10 mg daily, beta-blocker, BiDil and spironolactone 25 mg daily -No ACE/ARB/ARNi due to history of angioedema on ACE inhibitors -She does not appear volume overloaded on exam  Hypertensive emergency -She still remains markedly hypertensive with blood pressure this morning 195/111 mmHg -She has had an extensive work-up for secondary causes of hypertension in the past  -she had a PRA of 8.2 which was mildly elevated in 2021 but normal aldosterone and normal Aldo PRA ratio at that time.  This has been repeated this admission and is pending -Renal Dopplers in 2021 showed no evidence of renal artery stenosis -Urine catecholamines in 2021 showed slightly elevated metanephrines and normetanephrine's -Repeating 24-hour urine for catecholamines this admission -Continue amlodipine 10 mg daily and spironolactone 25 mg daily -Continue carvedilol 18.75 mg twice daily -Increase BiDil to 2 tablets twice daily -Next step would be to increase carvedilol to 25 mg twice daily.  Could also consider increasing BiDil to 3 times daily but will try to keep all meds at twice daily dosing or less to try to improve prove compliance -would benefit from Advanced HTN clinic on discharge  Chest pain -She has had episodes of this on and off mainly with exertion and emotional stress -Serum troponin slightly elevated but flat at 34 and 36 consistent with demand  ischemia in the setting of hypertensive emergency and not consistent with ACS -Her EF is down which is likely related to hypertensive cardiomyopathy but I think we should rule out underlying CAD -make n.p.o. after midnight for coronary CTA tomorrow   For questions or updates, please contact [facility] Please consult [email or web address] for contact info under        Signed, Armanda Magic, MD  01/28/2022, 9:24 AM

## 2022-01-28 NOTE — Progress Notes (Addendum)
Paged by nurse re 6 beats NSVT. BP 146/97, HR 56 SB, sleeping, asymptomatic. K OK overnight. Mg OK yesterday. Will recheck lytes this AM. Turner otherwise recommends continuing plan as outlined in note today. Addendum: K 3.8, will supplement for goal 4.0 or greater. Mg wnl.

## 2022-01-28 NOTE — Progress Notes (Addendum)
Progress Note:    Robin Jarvis    AOZ:[id number] DOB: Mar 02, 1976 DOA: 01/25/2022  PCP: System, Provider Not In    Brief Narrative:   46 year old African-American female with a past medical history of hypertension, morbid obesity, medication noncompliance due to no insurance, history of grade 1 diastolic dysfunction and cardiomegaly.  She was admitted for hypertensive emergency and chest pain and initially on Cleviprex drip but now weaned off.  Initially under ICU care. Now on medical tele floor.   Subjective:   No acute complaints.  No chest pain.  No syncope, presyncope.  No shortness of breath.  No peripheral edema.   Assessment and Plan:   Dilated cardiomyopathy/chronic combined systolic and diastolic CHF: likely due to hypertensive cardiomyopathy. Echo this admission shows EF 40%, grade 2 diastolic dysfunction, severe LVH and global hypokinesis of the left ventricle.  Not in acute decompensation. Cardiology following. Not a candidate for ACE/ARB/ARNI due to history of angioedema of ACE inhibitors. Continue other GDMT with Farxiga, Coreg, BiDil and Aldactone.  Hypertensive emergency: Weaned off Cleviprex drip. Continue Norvasc and other oral agents as above. Cardiology recommends Advanced HTN clinic follow up on discharge.  Chest pain: Mildly elevated high sensitive troponins now flattened.  Likely due to demand ischemia.  Chest pain resolved.  Doubt ACS. Planned coronary CTA tomorrow. NPO after midnight.  Chronic kidney disease stage II: Appears at baseline.  Baseline creatinine 0.9-1.1.  Morbid obesity: BMI 35+ hypertension/CHF  Medication noncompliance: secondary to no insurance.  TOC consulted and seeing if patient qualifies for Medicaid. Will need to send RX on discharge to [facility] pharmacy.   Other information:    DVT prophylaxis: Heparin subcu Code Status: Full code Family Communication: No family present at bedside Disposition:   Status is: Inpatient Remains  inpatient appropriate because: coronary CTA tomorrow and medication optimization for severely uncontrolled HTN   Consultants:   Cardiology    Objective:    Vitals:   01/28/22 0626 01/28/22 0831 01/28/22 0941 01/28/22 1055  BP: (!) 178/110 (!) 195/111 (!) 149/94 (!) 146/97  Pulse:  63  (!) 56  Resp:  18  18  Temp:  98.4 F (36.9 C)    TempSrc:  Oral    SpO2:  100%    Weight:      Height:        Intake/Output Summary (Last 24 hours) at 01/28/2022 1134 Last data filed at 01/28/2022 0800 Gross per 24 hour  Intake 970 ml  Output --  Net 970 ml    Filed Weights   01/25/22 0945 01/28/22 0100  Weight: 108.9 kg 113.5 kg       Physical Exam:    General exam: Appears calm and comfortable  Respiratory system: Clear to auscultation. Respiratory effort normal. Cardiovascular system: S1 & S2 heard, RRR. No JVD, murmurs, rubs, gallops or clicks. No pedal edema. Gastrointestinal system: Abdomen is nondistended, soft and nontender. No organomegaly or masses felt. Normal bowel sounds heard. Central nervous system: Alert and oriented. No focal neurological deficits. Extremities: Symmetric 5 x 5 power. Skin: No rashes, lesions or ulcers Psychiatry: Judgement and insight appear normal. Mood & affect appropriate.     Data Reviewed:    I have personally reviewed following labs and imaging studies  CBC: Recent Labs  Lab 01/25/22 1000 01/26/22 0324 01/27/22 0225 01/28/22 0001  WBC 12.0* 14.5* 11.3* 12.1*  HGB 14.4 12.9 13.2 12.2  HCT 42.2 38.2 40.4 36.5  MCV 90.9 92.3 95.1 95.1  PLT  251 265 273 244     Basic Metabolic Panel: Recent Labs  Lab 01/25/22 1000 01/25/22 1814 01/26/22 0324 01/27/22 0225 01/28/22 0001  NA 137 138 135 135 134*  K 2.8* 3.0* 3.3* 3.8 4.0  CL 99 98 100 103 105  CO2 27 26 24 24 23   GLUCOSE 118* 128* 113* 112* 96  BUN 16 12 14 14 17   CREATININE 1.16* 1.18* 1.27* 1.08* 1.23*  CALCIUM 9.1 8.9 8.3* 8.2* 7.8*  MG  --   --  1.7 2.2  --    PHOS  --   --  3.4 3.1  --      GFR: Estimated Creatinine Clearance: 79.2 mL/min (A) (by C-G formula based on SCr of 1.23 mg/dL (H)).  Liver Function Tests: No results for input(s): "AST", "ALT", "ALKPHOS", "BILITOT", "PROT", "ALBUMIN" in the last 168 hours.  CBG: Recent Labs  Lab 01/25/22 1515  GLUCAP 146*      No results found for this or any previous visit (from the past 240 hour(s)).       Radiology Studies:    DG Chest Port 1 View  Result Date: 01/27/2022 CLINICAL DATA:  Dyspnea. EXAM: PORTABLE CHEST 1 VIEW COMPARISON:  01/25/2022 FINDINGS: Lungs are adequately inflated without airspace consolidation, effusion or pneumothorax. Mild stable cardiomegaly. Remainder of the exam is unchanged. IMPRESSION: 1. No acute findings. 2. Mild stable cardiomegaly. Electronically Signed   By: 01/29/2022 M.D.   On: 01/27/2022 10:07        Medications:    Scheduled Meds:  amLODipine  10 mg Oral Daily   carvedilol  18.75 mg Oral BID WC   [START ON 01/29/2022] dapagliflozin propanediol  10 mg Oral Daily   heparin  5,000 Units Subcutaneous Q8H   isosorbide-hydrALAZINE  2 tablet Oral BID   [START ON 01/29/2022] spironolactone  25 mg Oral Daily   Continuous Infusions:     LOS: 3 days    Time spent: 55 minutes    01/31/2022, MD [facility]   To contact the attending provider between 7A-7P or the covering provider during after hours 7P-7A, please log into the web site [email or web address] and access using universal [place] password for that web site. If you do not have the password, please call the hospital operator.  01/28/2022, 11:34 AM

## 2022-01-29 ENCOUNTER — Other Ambulatory Visit (HOSPITAL_COMMUNITY): Payer: Self-pay

## 2022-01-29 ENCOUNTER — Inpatient Hospital Stay (HOSPITAL_COMMUNITY): Payer: Self-pay

## 2022-01-29 DIAGNOSIS — R079 Chest pain, unspecified: Secondary | ICD-10-CM

## 2022-01-29 LAB — BASIC METABOLIC PANEL
Anion gap: 8 (ref 5–15)
BUN: 18 mg/dL (ref 6–20)
CO2: 22 mmol/L (ref 22–32)
Calcium: 8.3 mg/dL — ABNORMAL LOW (ref 8.9–10.3)
Chloride: 105 mmol/L (ref 98–111)
Creatinine, Ser: 1.12 mg/dL — ABNORMAL HIGH (ref 0.44–1.00)
GFR, Estimated: >60 mL/min (ref >60)
Glucose, Bld: 88 mg/dL (ref 70–99)
Potassium: 3.7 mmol/L (ref 3.5–5.1)
Sodium: 135 mmol/L (ref 135–145)

## 2022-01-29 LAB — CBC
HCT: 38.1 % (ref 36.0–46.0)
Hemoglobin: 12.5 g/dL (ref 12.0–15.0)
MCH: 31 pg (ref 26.0–34.0)
MCHC: 32.8 g/dL (ref 30.0–36.0)
MCV: 94.5 fL (ref 80.0–100.0)
Platelets: 242 10*3/uL (ref 150–400)
RBC: 4.03 MIL/uL (ref 3.87–5.11)
RDW: 14.4 % (ref 11.5–15.5)
WBC: 10.5 10*3/uL (ref 4.0–10.5)
nRBC: 0 % (ref 0.0–0.2)

## 2022-01-29 LAB — MAGNESIUM: Magnesium: 2 mg/dL (ref 1.7–2.4)

## 2022-01-29 MED ORDER — NITROGLYCERIN 0.4 MG SL SUBL
0.8000 mg | SUBLINGUAL_TABLET | Freq: Once | SUBLINGUAL | Status: AC
  Administered 2022-01-29: 0.8 mg via SUBLINGUAL

## 2022-01-29 MED ORDER — ENOXAPARIN SODIUM 40 MG/0.4ML IJ SOSY
40.0000 mg | PREFILLED_SYRINGE | INTRAMUSCULAR | Status: DC
  Administered 2022-01-29 – 2022-02-01 (×4): 40 mg via SUBCUTANEOUS
  Filled 2022-01-29 (×4): qty 0.4

## 2022-01-29 MED ORDER — CARVEDILOL 25 MG PO TABS
25.0000 mg | ORAL_TABLET | Freq: Two times a day (BID) | ORAL | Status: DC
  Administered 2022-01-29 – 2022-02-02 (×9): 25 mg via ORAL
  Filled 2022-01-29 (×9): qty 1

## 2022-01-29 MED ORDER — IOHEXOL 350 MG/ML SOLN
90.0000 mL | Freq: Once | INTRAVENOUS | Status: AC | PRN
  Administered 2022-01-29: 90 mL via INTRAVENOUS

## 2022-01-29 MED ORDER — CHLORTHALIDONE 25 MG PO TABS
25.0000 mg | ORAL_TABLET | Freq: Every day | ORAL | Status: DC
  Administered 2022-01-29 – 2022-02-02 (×5): 25 mg via ORAL
  Filled 2022-01-29 (×5): qty 1

## 2022-01-29 MED ORDER — NITROGLYCERIN 0.4 MG SL SUBL
SUBLINGUAL_TABLET | SUBLINGUAL | Status: AC
  Filled 2022-01-29: qty 2

## 2022-01-29 NOTE — Progress Notes (Addendum)
Progress Note:    Robin Jarvis    ENI:[id number] DOB: 07/27/1976 DOA: 01/25/2022  PCP: System, Provider Not In    Brief Narrative:   46 year old African-American female with a past medical history of hypertension, morbid obesity, medication noncompliance due to no insurance, history of grade 1 diastolic dysfunction and cardiomegaly.  She was admitted for hypertensive emergency and chest pain and initially on Cleviprex drip but now weaned off.  Initially under ICU care. Now on medical tele floor.   Subjective:   No acute complaints.  No chest pain.  No syncope, presyncope.  No shortness of breath. Feeling anxious   Assessment and Plan:   Dilated cardiomyopathy/chronic combined systolic and diastolic CHF: likely due to hypertensive cardiomyopathy. Echo this admission shows EF 40%, grade 2 diastolic dysfunction, severe LVH and global hypokinesis of the left ventricle.  Not in acute decompensation. Cardiology following. Not a candidate for ACE/ARB/ARNI due to history of angioedema of ACE inhibitors. Continue other GDMT with Farxiga, Coreg, BiDil and Aldactone. Coronary CT pending for today.  Hypertensive emergency: Weaned off Cleviprex drip. BP remains severely elevated. Continuing amlodipine and bidil and spironoloactone. Cardiology has increased dose of coreg and added chlorthalidone today. Plasma renin/aldosterone pending. Urine metanephrines pending. Will plan on outpatient cardiology hypertension clinic f/u.  Chest pain: Mildly elevated high sensitive troponins now flattened.  Likely due to demand ischemia.  Chest pain resolved.  Doubt ACS. Planned coronary CTA today  Chronic kidney disease stage II: Appears at baseline.  Baseline creatinine 0.9-1.1.  Morbid obesity: BMI 35+ hypertension/CHF  Medication noncompliance: secondary to no insurance.  TOC consulted and seeing if patient qualifies for Medicaid. Will need to send RX on discharge to [facility] pharmacy.   Other information:     DVT prophylaxis: lovenox subcu Code Status: Full code Family Communication: No family present at bedside Disposition:   Status is: Inpatient Remains inpatient appropriate because: coronary CTA tomorrow and medication optimization for severely uncontrolled HTN   Consultants:   Cardiology    Objective:    Vitals:   01/28/22 2313 01/29/22 0622 01/29/22 0715 01/29/22 0816  BP: (!) 154/89 (!) 197/129 (!) 181/110 (!) 199/116  Pulse:  71  68  Resp:  20  19  Temp:  97.9 F (36.6 C)  98 F (36.7 C)  TempSrc:  Oral  Oral  SpO2:  98%  98%  Weight:      Height:        Intake/Output Summary (Last 24 hours) at 01/29/2022 0838 Last data filed at 01/29/2022 0600 Gross per 24 hour  Intake 720 ml  Output 1400 ml  Net -680 ml   Filed Weights   01/25/22 0945 01/28/22 0100  Weight: 108.9 kg 113.5 kg       Physical Exam:    General exam: Appears calm and comfortable  Respiratory system: Clear to auscultation. Respiratory effort normal. Cardiovascular system: S1 & S2 heard, RRR. No JVD, murmurs, rubs, gallops or clicks.  Gastrointestinal system: Abdomen is nondistended, soft and nontender. No organomegaly or masses felt. Normal bowel sounds heard. Central nervous system: Alert and oriented. No focal neurological deficits. Extremities: Symmetric 5 x 5 power. Trace LE edema Skin: No rashes, lesions or ulcers Psychiatry: Judgement and insight appear normal. Mood & affect appropriate.     Data Reviewed:    I have personally reviewed following labs and imaging studies  CBC: Recent Labs  Lab 01/25/22 1000 01/26/22 0324 01/27/22 0225 01/28/22 0001 01/29/22 0327  WBC 12.0* 14.5*  11.3* 12.1* 10.5  HGB 14.4 12.9 13.2 12.2 12.5  HCT 42.2 38.2 40.4 36.5 38.1  MCV 90.9 92.3 95.1 95.1 94.5  PLT 251 265 273 244 242    Basic Metabolic Panel: Recent Labs  Lab 01/26/22 0324 01/27/22 0225 01/28/22 0001 01/28/22 1112 01/29/22 0327  NA 135 135 134* 135 135  K 3.3*  3.8 4.0 3.8 3.7  CL 100 103 105 104 105  CO2 24 24 23 23 22   GLUCOSE 113* 112* 96 92 88  BUN 14 14 17 15 18   CREATININE 1.27* 1.08* 1.23* 1.09* 1.12*  CALCIUM 8.3* 8.2* 7.8* 8.1* 8.3*  MG 1.7 2.2  --  2.0 2.0  PHOS 3.4 3.1  --   --   --     GFR: Estimated Creatinine Clearance: 87 mL/min (A) (by C-G formula based on SCr of 1.12 mg/dL (H)).  Liver Function Tests: No results for input(s): "AST", "ALT", "ALKPHOS", "BILITOT", "PROT", "ALBUMIN" in the last 168 hours.  CBG: Recent Labs  Lab 01/25/22 1515  GLUCAP 146*     No results found for this or any previous visit (from the past 240 hour(s)).       Radiology Studies:    DG Chest Port 1 View  Result Date: 01/27/2022 CLINICAL DATA:  Dyspnea. EXAM: PORTABLE CHEST 1 VIEW COMPARISON:  01/25/2022 FINDINGS: Lungs are adequately inflated without airspace consolidation, effusion or pneumothorax. Mild stable cardiomegaly. Remainder of the exam is unchanged. IMPRESSION: 1. No acute findings. 2. Mild stable cardiomegaly. Electronically Signed   By: 01/29/2022 M.D.   On: 01/27/2022 10:07        Medications:    Scheduled Meds:  amLODipine  10 mg Oral Daily   carvedilol  25 mg Oral BID WC   chlorthalidone  25 mg Oral Daily   dapagliflozin propanediol  10 mg Oral Daily   heparin  5,000 Units Subcutaneous Q8H   isosorbide-hydrALAZINE  2 tablet Oral BID   spironolactone  25 mg Oral Daily   Continuous Infusions:     LOS: 4 days    Time spent: 30 minutes    Elberta Fortis, MD [facility]   To contact the attending provider between 7A-7P or the covering provider during after hours 7P-7A, please log into the web site [email or web address] and access using universal  password for that web site. If you do not have the password, please call the hospital operator.  01/29/2022, 8:38 AM

## 2022-01-29 NOTE — Progress Notes (Signed)
Progress Note  Patient Name: Robin Jarvis Date of Encounter: 01/29/2022  [facility] Cardiologist: Armanda Magic, MD   Subjective   Denies any CP or SOB.   Inpatient Medications    Scheduled Meds:  amLODipine  10 mg Oral Daily   carvedilol  25 mg Oral BID WC   chlorthalidone  25 mg Oral Daily   dapagliflozin propanediol  10 mg Oral Daily   heparin  5,000 Units Subcutaneous Q8H   isosorbide-hydrALAZINE  2 tablet Oral BID   spironolactone  25 mg Oral Daily   Continuous Infusions:  PRN Meds: acetaminophen, docusate sodium, hydrALAZINE, melatonin, metoprolol tartrate, ondansetron (ZOFRAN) IV, polyethylene glycol, white petrolatum   Vital Signs    Vitals:   01/28/22 2156 01/28/22 2313 01/29/22 0622 01/29/22 0715  BP: (!) 185/115 (!) 154/89 (!) 197/129 (!) 181/110  Pulse:   71   Resp: 16  20   Temp: 98 F (36.7 C)  97.9 F (36.6 C)   TempSrc: Oral  Oral   SpO2: 99%  98%   Weight:      Height:        Intake/Output Summary (Last 24 hours) at 01/29/2022 0816 Last data filed at 01/29/2022 0600 Gross per 24 hour  Intake 720 ml  Output 1400 ml  Net -680 ml      01/28/2022    1:00 AM 01/25/2022    9:45 AM 02/08/2021    4:25 PM  Last 3 Weights  Weight (lbs) 250 lb 3.6 oz 240 lb 245 lb  Weight (kg) 113.5 kg 108.863 kg 111.131 kg      Telemetry    NSR with single episode of short NSVT - Personally Reviewed  ECG    NSR without significant ST-T wave changes - Personally Reviewed  Physical Exam   GEN: No acute distress.   Neck: No JVD Cardiac: RRR, no murmurs, rubs, or gallops.  Respiratory: Clear to auscultation bilaterally. GI: Soft, nontender, non-distended  MS: No edema; No deformity. Neuro:  Nonfocal  Psych: Normal affect   Labs    High Sensitivity Troponin:   Recent Labs  Lab 01/25/22 1000 01/25/22 1212  TROPONINIHS 36* 34*     Chemistry Recent Labs  Lab 01/27/22 0225 01/28/22 0001 01/28/22 1112 01/29/22 0327  NA 135 134* 135 135  K  3.8 4.0 3.8 3.7  CL 103 105 104 105  CO2 24 23 23 22   GLUCOSE 112* 96 92 88  BUN 14 17 15 18   CREATININE 1.08* 1.23* 1.09* 1.12*  CALCIUM 8.2* 7.8* 8.1* 8.3*  MG 2.2  --  2.0 2.0  GFRNONAA >60 56* >60 >60  ANIONGAP 8 6 8 8     Lipids  Recent Labs  Lab 01/25/22 1814  CHOL 151  TRIG 75  HDL 36*  LDLCALC 100*  CHOLHDL 4.2    Hematology Recent Labs  Lab 01/27/22 0225 01/28/22 0001 01/29/22 0327  WBC 11.3* 12.1* 10.5  RBC 4.25 3.84* 4.03  HGB 13.2 12.2 12.5  HCT 40.4 36.5 38.1  MCV 95.1 95.1 94.5  MCH 31.1 31.8 31.0  MCHC 32.7 33.4 32.8  RDW 15.1 14.6 14.4  PLT 273 244 242   Thyroid No results for input(s): "TSH", "FREET4" in the last 168 hours.  BNP Recent Labs  Lab 01/25/22 1000  BNP 1,003.2*    DDimer No results for input(s): "DDIMER" in the last 168 hours.   Radiology    DG Chest Port 1 View  Result Date: 01/27/2022 CLINICAL DATA:  Dyspnea. EXAM: PORTABLE CHEST 1 VIEW COMPARISON:  01/25/2022 FINDINGS: Lungs are adequately inflated without airspace consolidation, effusion or pneumothorax. Mild stable cardiomegaly. Remainder of the exam is unchanged. IMPRESSION: 1. No acute findings. 2. Mild stable cardiomegaly. Electronically Signed   By: Elberta Fortis M.D.   On: 01/27/2022 10:07    Cardiac Studies   Echo 01/26/2022  1. Left ventricular ejection fraction, by estimation, is 40%. The left  ventricle has mildly decreased function. The left ventricle demonstrates  global hypokinesis. The left ventricular internal cavity size was mildly  dilated. There is severe left  ventricular hypertrophy. Left ventricular diastolic parameters are  consistent with Grade II diastolic dysfunction (pseudonormalization).  Elevated left ventricular end-diastolic pressure.   2. Right ventricular systolic function is normal. The right ventricular  size is normal. There is moderately elevated pulmonary artery systolic  pressure.   3. Left atrial size was severely dilated.   4. The  pericardial effusion is posterior to the left ventricle.   5. The mitral valve is abnormal. Mild mitral valve regurgitation. No  evidence of mitral stenosis.   6. Tricuspid valve regurgitation is moderate to severe.   7. The aortic valve is tricuspid. There is mild calcification of the  aortic valve. There is mild thickening of the aortic valve. Aortic valve  regurgitation is trivial. Aortic valve sclerosis is present, with no  evidence of aortic valve stenosis.   8. The inferior vena cava is dilated in size with <50% respiratory  variability, suggesting right atrial pressure of 15 mmHg.  Patient Profile     46 y.o. female with a hx of HTN who is being seen 01/27/2022 for the evaluation of dilated cardiomyopathy in the setting of hypertensive emergency at the request of Dr. Linna Darner.  Assessment & Plan    Dilated cardiomyopathy:       -Although patient had exertional chest pain prior to admission, most likely cause for her dilated cardiomyopathy is related to chronically uncontrolled high blood pressure.  Her chest discomfort is also associated with emotional stress as well.  Will discuss with MD, potentially consider coronary CT to further assess.             -EF 40 to 45% on echocardiogram during this admission.  Previous echo in 2021 showed EF 50 to 55%.  Echocardiogram in 2009 showed EF 60 to 65%.             -not on ACEI/ARB/ARNI due to history of angioedema on ACE inhibitor  - continue farxiga, BB, BiDil, and spironolactone.    Hypertensive emergency       - urine catecholamine in 2021 showed slightly elevated metanephrine and normeta. No evidence of renal artery stenosis on doppler in 2021. Mildly elevated PRA in 2021, but normal aldosterone/PRA ratio.  - Increase coreg to 25mg  BID. Add 25mg  daily Chlorthalidone. Dr. discussed with Dr. , patient will need to be set up with Dr. Mayford Knife of hypertension clinic. May be a candidate for renal artery denervation procedure.     3.   Chest pain: Chest pain occurs both with exertion and with emotional stress.  Serial troponin borderline elevated and remained flat, likely demand ischemia in the setting of hypertensive emergency. Pending coronary CT.    4. Hypokalemia: improving      For questions or updates, please contact [facility] Please consult [email or web address] for contact info under        Signed, Duke Salvia, PA  01/29/2022, 8:16 AM

## 2022-01-30 ENCOUNTER — Inpatient Hospital Stay (HOSPITAL_COMMUNITY): Payer: Self-pay

## 2022-01-30 ENCOUNTER — Other Ambulatory Visit (HOSPITAL_COMMUNITY): Payer: Self-pay

## 2022-01-30 ENCOUNTER — Encounter (HOSPITAL_COMMUNITY): Payer: Self-pay | Admitting: Pulmonary Disease

## 2022-01-30 DIAGNOSIS — I10 Essential (primary) hypertension: Secondary | ICD-10-CM

## 2022-01-30 DIAGNOSIS — I1A0 Resistant hypertension: Secondary | ICD-10-CM

## 2022-01-30 DIAGNOSIS — I159 Secondary hypertension, unspecified: Secondary | ICD-10-CM

## 2022-01-30 DIAGNOSIS — I160 Hypertensive urgency: Secondary | ICD-10-CM

## 2022-01-30 HISTORY — DX: Resistant hypertension: I1A.0

## 2022-01-30 LAB — BASIC METABOLIC PANEL
Anion gap: 8 (ref 5–15)
BUN: 16 mg/dL (ref 6–20)
CO2: 22 mmol/L (ref 22–32)
Calcium: 8.7 mg/dL — ABNORMAL LOW (ref 8.9–10.3)
Chloride: 106 mmol/L (ref 98–111)
Creatinine, Ser: 1.19 mg/dL — ABNORMAL HIGH (ref 0.44–1.00)
GFR, Estimated: 58 mL/min — ABNORMAL LOW (ref >60)
Glucose, Bld: 96 mg/dL (ref 70–99)
Potassium: 3.4 mmol/L — ABNORMAL LOW (ref 3.5–5.1)
Sodium: 136 mmol/L (ref 135–145)

## 2022-01-30 MED ORDER — POTASSIUM CHLORIDE CRYS ER 20 MEQ PO TBCR
40.0000 meq | EXTENDED_RELEASE_TABLET | Freq: Once | ORAL | Status: AC
  Administered 2022-01-30: 40 meq via ORAL
  Filled 2022-01-30: qty 2

## 2022-01-30 MED ORDER — ISOSORBIDE MONONITRATE ER 30 MG PO TB24
30.0000 mg | ORAL_TABLET | Freq: Every day | ORAL | Status: DC
  Administered 2022-01-30 – 2022-02-02 (×4): 30 mg via ORAL
  Filled 2022-01-30 (×4): qty 1

## 2022-01-30 MED ORDER — HYDRALAZINE HCL 50 MG PO TABS
100.0000 mg | ORAL_TABLET | Freq: Three times a day (TID) | ORAL | Status: DC
  Administered 2022-01-30 – 2022-02-02 (×9): 100 mg via ORAL
  Filled 2022-01-30 (×9): qty 2

## 2022-01-30 NOTE — Progress Notes (Addendum)
Heart Failure Stewardship Pharmacist Progress Note   PCP: System, Provider Not In PCP-Cardiologist: Armanda Magic, MD   HPI:  46 year old female with PMH significant for uncontrolled HTN. Presents to [facility] on 6/15 with reported 1 week of chest pain, headache, and nausea. Reports not taking hypertensive medications since last June due to lack of health insurance. On arrival to ED BP 230/127. Crt 1.16, K 2.8, BNP 1003. Troponin 36 > 34. EKG with LVH. Given Labetalol 60 mg IV total, Norvasc 10 mg, Hydralazine 50 mg tab, nitro patch, Potassium 40 meq, followed by 10 meq x 3. Decision made to transfer to [facility] for further evaluation.   EF of 40-45%, global HK, severe LVH, GIIDD, RV normal, mod-severe TR, on 01/26/2022. BP remains difficult to control. Secondary causes of HTN work-up ongoing. Coronary CT to evaluate coronary anatomy with no CAD and coronary calcium score of 0 on 01/30/2022. Team plans to refer her to Dr. Duke Salvia for potential renal denervation as an outpatient.   Current HF Medications: Diuretic: none Beta Blocker: carvedilol 25 mg BID ACE/ARB/ARNI: none h/x of angioedema to ACEi Aldosterone Antagonist: spironolactone 25 mg daily SGLT2i: Farxiga 10 mg daily  Other: Amlodipine 10 mg daily, imdur 30 daily, hydralazine 100 q8h , chlorthalidone 25 mg daily, KCL 40 mEq x1  Prior to admission HF Medications: None - was prescribed previous medications for HTN, but was not taking due to cost issues  Pertinent Lab Values: Serum creatinine 1.19, BUN 16, Potassium 3.4, Sodium 136, BNP 1003, Magnesium 2.0, A1c 5.5% Urine catecholamines, plasma renin, aldosterone, and PRA pending  Vital Signs: Weight: 243 lbs (admission weight: 240 lbs) Blood pressure: 175-185/94-125 mmHg  Heart rate: 55-65  I/O: -2.7 L yesterday; net even  Medication Assistance / Insurance Benefits Check: Does the patient have prescription insurance?  No  Does the patient qualify for medication assistance  through manufacturers or grants?   Yes Eligible grants and/or patient assistance programs: Farxiga Medication assistance applications in progress: Farxiga  Medication assistance applications approved: pending Approved medication assistance renewals will be completed by: pending  Outpatient Pharmacy:  Prior to admission outpatient pharmacy: [facility] Is the patient willing to use [facility] TOC pharmacy at discharge? Yes Is the patient willing to transition their outpatient pharmacy to utilize a [facility] outpatient pharmacy?   Pending   Assessment: 1. Acute on chronic CHF (LVEF 40-45%), due to hypertensive cardiomyopathy. NYHA class II symptoms. - BP still above goal. Must avoid ACE with h/x of angioedema to ACEi. Would be reasonable to trial an ARB/ARNi. Patient would be a good ARNi candidate given EF < 57% and significantly elevated BP. -On all first line anti-hypertensive treatments beside ACE/ARB and add on spironolactone. Can increase both to chlorthalidone and spironolactone to 50 mg for additional BP coverage. -No room to increase carvedilol due to HR. Continue carvedilol 25 mg BID. Target dose would be 50 mg BID based upon her weight. May be able to increase outpatient in th future.  -Agree with splitting of BiDil to components. Patient on imdur 30 daily and hydralazine 100 mg q8h currently. Can increase imdur to 30 BID, however would wait until follow-up due to possibility of HA with increasing imdur too quickly.  -K low at 3.4, give 40 mEq x1   Plan: 1) Medication changes recommended at this time: -Recommend trial of valsartan 80 mg BID while in the hospital if team/patient not amendable would increase spironolactone to 50 mg daily -Give 40 mEq x1  2) Patient  assistance: -Farxiga application completed 01/30/2022  -plan for MATCH at discharge, patient approved  -Financial navigators screening for Medicaid eligibility  -SW to talk with patient and plan f/u with community health and  wellness for help with cost of medications and doctor copays while insurance pending  3)  Education  - To be completed prior to discharge  Drake Leach, PharmD, [facility] PGY2 Cardiology Pharmacy Resident

## 2022-01-30 NOTE — Progress Notes (Signed)
Progress Note  Patient Name: Robin Jarvis Date of Encounter: 01/30/2022  [facility] Cardiologist: Armanda Magic, MD   Subjective   No acute overnight events. BP remains markedly elevated in the 180s/120s. Patient has no complaints this morning. No chest pain or shortness of breath.  Inpatient Medications    Scheduled Meds:  amLODipine  10 mg Oral Daily   carvedilol  25 mg Oral BID WC   chlorthalidone  25 mg Oral Daily   dapagliflozin propanediol  10 mg Oral Daily   enoxaparin (LOVENOX) injection  40 mg Subcutaneous Q24H   isosorbide-hydrALAZINE  2 tablet Oral BID   spironolactone  25 mg Oral Daily   Continuous Infusions:  PRN Meds: acetaminophen, docusate sodium, hydrALAZINE, melatonin, metoprolol tartrate, ondansetron (ZOFRAN) IV, polyethylene glycol, white petrolatum   Vital Signs    Vitals:   01/29/22 1710 01/29/22 2100 01/30/22 0546 01/30/22 0750  BP: (!) 181/102 (!) 175/106 (!) 185/125   Pulse: 64 69 68 66  Resp:  18 18   Temp:  98.3 F (36.8 C) 98.3 F (36.8 C) 98.7 F (37.1 C)  TempSrc:  Oral Oral Oral  SpO2:  99% 98% 99%  Weight:   110.3 kg   Height:        Intake/Output Summary (Last 24 hours) at 01/30/2022 0833 Last data filed at 01/30/2022 0488 Gross per 24 hour  Intake 600 ml  Output 1900 ml  Net -1300 ml      01/30/2022    5:46 AM 01/28/2022    1:00 AM 01/25/2022    9:45 AM  Last 3 Weights  Weight (lbs) 243 lb 3.2 oz 250 lb 3.6 oz 240 lb  Weight (kg) 110.315 kg 113.5 kg 108.863 kg      Telemetry    Normal sinus rhythm with rates in the 60s. - Personally Reviewed  ECG    No new ECG tracing today. - Personally Reviewed  Physical Exam   Physical Exam per MD:  GEN: No acute distress.   Neck: No JVD. Cardiac: RRR. No murmurs, rubs, or gallops.  Respiratory: Clear to auscultation bilaterally. No wheezes, rhonchi, or rales. GI: Soft, non-distended, and non-tender. MS: No lower extremity edema. No deformity. Skin: Warm and  dry. Neuro:  No focal deficits. Psych: Normal affect. Responds appropriately.  Labs    High Sensitivity Troponin:   Recent Labs  Lab 01/25/22 1000 01/25/22 1212  TROPONINIHS 36* 34*     Chemistry Recent Labs  Lab 01/27/22 0225 01/28/22 0001 01/28/22 1112 01/29/22 0327 01/30/22 0228  NA 135   < > 135 135 136  K 3.8   < > 3.8 3.7 3.4*  CL 103   < > 104 105 106  CO2 24   < > 23 22 22   GLUCOSE 112*   < > 92 88 96  BUN 14   < > 15 18 16   CREATININE 1.08*   < > 1.09* 1.12* 1.19*  CALCIUM 8.2*   < > 8.1* 8.3* 8.7*  MG 2.2  --  2.0 2.0  --   GFRNONAA >60   < > >60 >60 58*  ANIONGAP 8   < > 8 8 8    < > = values in this interval not displayed.    Lipids  Recent Labs  Lab 01/25/22 1814  CHOL 151  TRIG 75  HDL 36*  LDLCALC 100*  CHOLHDL 4.2    Hematology Recent Labs  Lab 01/27/22 0225 01/28/22 0001 01/29/22 0327  WBC 11.3*  12.1* 10.5  RBC 4.25 3.84* 4.03  HGB 13.2 12.2 12.5  HCT 40.4 36.5 38.1  MCV 95.1 95.1 94.5  MCH 31.1 31.8 31.0  MCHC 32.7 33.4 32.8  RDW 15.1 14.6 14.4  PLT 273 244 242   Thyroid No results for input(s): "TSH", "FREET4" in the last 168 hours.  BNP Recent Labs  Lab 01/25/22 1000  BNP 1,003.2*    DDimer No results for input(s): "DDIMER" in the last 168 hours.   Radiology    CT CORONARY MORPH W/CTA COR W/SCORE W/CA W/CM &/OR WO/CM  Addendum Date: 01/29/2022   ADDENDUM REPORT: 01/29/2022 16:38 HISTORY: 46 yo female, non-ischemic cardiomyopathy suspected EXAM: Cardiac/Coronary CTA TECHNIQUE: The patient was scanned on a Office manager. PROTOCOL: A 90 kV prospective scan was triggered in the descending thoracic aorta at 111 HU's. Axial non-contrast 3 mm slices were carried out through the heart. The data set was analyzed on a dedicated work station and scored using the Agatson method. Gantry rotation speed was 250 msecs and collimation was .6 mm. Beta blockade and 0.8 mg of sl NTG was given. The 3D data set was reconstructed in 5%  intervals of the 35-75 % of the R-R cycle. Diastolic phases were analyzed on a dedicated work station using MPR, MIP and VRT modes. The patient received 43mL OMNIPAQUE IOHEXOL 350 MG/ML SOLN of contrast. FINDINGS: Quality: Good, attenuation artifact, HR 60 Coronary calcium score: The patient's coronary artery calcium score is 0, which places the patient in the 0 percentile. Coronary arteries: Normal coronary origins.  Right dominance. Right Coronary Artery: Dominant.  Normal vessel. Left Main Coronary Artery: Normal. Bifurcates into the LAD and LCx arteries. Left Anterior Descending Coronary Artery: Anterior artery that reaches the apex. No disease. D1 and D2 branches without disease. Left Circumflex Artery: Proximal to mid vessel without disease. The distal vessel is poorly visualized. Aorta: Normal size, 35 mm at the mid ascending aorta (level of the PA bifurcation) measured double oblique. No calcifications. No dissection. Aortic Valve: Trileaflet. No calcifications. Other findings: Normal pulmonary vein drainage into the left atrium. Normal left atrial appendage without a thrombus. Dilated main pulmonary artery at 37 mm, suggestive of pulmonary hypertension. IMPRESSION: 1. No evidence of CAD, CADRADS = 0. 2. Coronary calcium score of 0. This was 0 percentile for age and sex matched control. 3. Normal coronary origin with right dominance. 4. Dilated main pulmonary artery at 37 mm, suggestive of pulmonary hypertension. Electronically Signed   By: Chrystie Nose M.D.   On: 01/29/2022 16:38   Result Date: 01/29/2022 EXAM: OVER-READ INTERPRETATION  CT CHEST The following report is a limited chest CT over-read performed by radiologist Dr. Genevive Bi of [facility] on 01/29/2022. The CTA interpretation by the cardiologist is attached. COMPARISON:  None Available. FINDINGS: Limited view of the lung parenchyma demonstrates no suspicious nodularity. Airways are normal. Limited view of the mediastinum  demonstrates no adenopathy. Esophagus normal. Limited view of the upper abdomen unremarkable. Limited view of the skeleton and chest wall is unremarkable. IMPRESSION: No significant extracardiac findings. Electronically Signed: By: Genevive Bi M.D. On: 01/29/2022 16:04    Cardiac Studies   Echocardiogram 01/26/2022: Impressions:  1. Left ventricular ejection fraction, by estimation, is 40%. The left  ventricle has mildly decreased function. The left ventricle demonstrates  global hypokinesis. The left ventricular internal cavity size was mildly  dilated. There is severe left  ventricular hypertrophy. Left ventricular diastolic parameters are  consistent with Grade II diastolic dysfunction (  pseudonormalization).  Elevated left ventricular end-diastolic pressure.   2. Right ventricular systolic function is normal. The right ventricular  size is normal. There is moderately elevated pulmonary artery systolic  pressure.   3. Left atrial size was severely dilated.   4. The pericardial effusion is posterior to the left ventricle.   5. The mitral valve is abnormal. Mild mitral valve regurgitation. No  evidence of mitral stenosis.   6. Tricuspid valve regurgitation is moderate to severe.   7. The aortic valve is tricuspid. There is mild calcification of the  aortic valve. There is mild thickening of the aortic valve. Aortic valve  regurgitation is trivial. Aortic valve sclerosis is present, with no  evidence of aortic valve stenosis.   8. The inferior vena cava is dilated in size with <50% respiratory  variability, suggesting right atrial pressure of 15 mmHg.  _______________  Coronary CTA 01/29/2022:  Patient Profile     46 y.o. female with a history of uncontrolled hypertension who was admitted on 01/25/2022 with hypertensive emergency after presenting with 1 weeks of chest pain, headache, and nausea and being found to have BP of 230/127. Cardiology was consulted for further evaluation of  this and dilated cardiomyopathy.  Assessment & Plan    Hypertensive Emergency Patient has a long history of hypertension and had not been on any medications in over a year due to financial issues. Previous secondary hypertension work-up in 2021 showed mildly elevated 14 hours normetanephrine and metanephrine and mildly elevated PRA of 8.2 (normal <5.38). Renal artery dopplers in 2021 was normal. BP as high as 230/127 on arrival. Patient has been restarted on multiple oral medications but BP remains markedly elevated. - BP 185/125 this morning.  - Current medications: Amlodipine 10mg  daily, Coreg 25mg  twice daily, Chlorthalidone 25mg  daily, Spironolactone 25mg  daily, and Bidil 20-37.5 (2 tablets twice daily). Discussed with MD. Will stop BiDil and start Hydralazine 100mg  three times daily and Imdur 30mg  daily.  - Repeat urine catecholamines and plasma renin, aldosterone, and PRA pending. - Will repeat renal artery dopplers given unable to control BP. - Plan is to have her follow-up [facility]. May be a candidate for renal artery denervation procedure.  Non-Ischemic Cardiomyopathy Echo this admission showed LVEF of 40% with global hypokinesis and grade 2 diastolic dysfunction, normal RV, severe left atrial enlargement, mild MR, moderate to severe TR, and moderately elevated PASP. Coronary CTA showed coronary calcium score of 0 with no evidence of CAD. - Continue Coreg, Spironolactone, Hydralazine, and Imdur as above. No on ACEi/ARB/ARNI due to history of angioedema on Lisinopril. - Continue Farxiga 10mg  daily. - Will likely need repeat Echo in a few months once we have BP better controlled to reassess LV function and valvular disease.  Chest Pain Demand Ischemia  Patient reported chest pain with exertion and emotion stress. High-sensitivity troponin minimally elevated and flat at 36 >> 34. Not consistent with ACS. Echo showed LVEF of 40%. Coronary CTA showed coronary  calcium score of 0 with no CAD. - Current chest pain free. - Likely due to markedly elevated hypertension. No additional work-up necessary.  Hypokalemia Potassium 3.4 today.  - Will replete with KCl 40 mEq today.    For questions or updates, please contact [place] Please consult [email or web address] for contact info under        Signed, Darreld Mclean, PA-C  01/30/2022, 8:33 AM

## 2022-01-30 NOTE — TOC Initial Note (Signed)
Transition of Care [facility]) - Initial/Assessment Note    Patient Details  Name: Robin Jarvis MRN: [id number] Date of Birth: 1975/12/24  Transition of Care [facility]) CM/SW Contact:    Lawerance Sabal, RN Phone Number: 01/30/2022, 11:29 AM  Clinical Narrative:            Sherron Monday w patient at bedside. Discussed importance of follow up and medication management after DC. Discussed preferences for primary care with limitations of being uninsured.  She states that transportation is not a barrier and she can follow up in [place] (lives in [place]). We discussed [facility] as their pharmacy is located there. Request sent to [facility] for scheduling of appointment prioritizing [facility] but if appointment unavailable in the next 30 days, at any other [facility]. Patient understands that she will still be able to use [facility] pharmacy if she follows at another [facility].  MATCH in system 6/16- 6/24, initial meds to be filled through [facility] pharmacy, patient aware she will be sent home with medications in hand at DC.        Expected Discharge Plan: Home/Self Care Barriers to Discharge: Continued Medical Work up   Patient Goals and CMS Choice Patient states their goals for this hospitalization and ongoing recovery are:: return home      Expected Discharge Plan and Services Expected Discharge Plan: Home/Self Care   Discharge Planning Services: CM Consult, MATCH Program, [facility], Medication Assistance   Living arrangements for the past 2 months: Single Family Home                 DME Arranged: N/A         HH Arranged: NA          Prior Living Arrangements/Services Living arrangements for the past 2 months: Single Family Home     Do you feel safe going back to the place where you live?: Yes               Activities of Daily Living Home Assistive Devices/Equipment: None ADL Screening (condition at time of admission) Patient's cognitive ability adequate to safely complete daily  activities?: Yes Is the patient deaf or have difficulty hearing?: No Does the patient have difficulty seeing, even when wearing glasses/contacts?: No Does the patient have difficulty concentrating, remembering, or making decisions?: No Patient able to express need for assistance with ADLs?: Yes Does the patient have difficulty dressing or bathing?: No Independently performs ADLs?: Yes (appropriate for developmental age) Does the patient have difficulty walking or climbing stairs?: No Weakness of Legs: None Weakness of Arms/Hands: None  Permission Sought/Granted                  Emotional Assessment              Admission diagnosis:  Elevated brain natriuretic peptide (BNP) level [R79.89] Hypertensive emergency [I16.1] Patient Active Problem List   Diagnosis Date Noted   DCM (dilated cardiomyopathy) (HCC)    Elevated troponin    Medically noncompliant    Dyspnea 11/10/2019   Chest pain 11/10/2019   Weight gain 11/10/2019   Cardiomegaly 11/10/2019   Elevated brain natriuretic peptide (BNP) level 11/10/2019   Hypokalemia 11/10/2019   Hypertensive emergency 11/09/2019   PCP:  System, Provider Not In Pharmacy:   [facility] [phone] - [place], [place] - [address] AT [facility] [address] [place] [address] Phone: [phone] Fax: [phone]  Social Determinants of Health (SDOH) Interventions    Readmission Risk Interventions     No data to display           

## 2022-01-30 NOTE — Hospital Course (Signed)
Robin Jarvis is a 46 year old female with PMH HTN, obesity, medication noncompliance due to financial constraints, diastolic CHF who presented with chest pain and uncontrolled blood pressures.  She initially was managed in the ICU on clevidipine drip.  Cardiology was also consulted on admission. Echo performed on admission which showed EF 40%, LV global hypokinesis, severe LVH, and grade 2 diastolic dysfunction.

## 2022-01-30 NOTE — Progress Notes (Signed)
Heart Failure Nurse Navigator Progress Note  PCP: System, Provider Not In PCP-Cardiologist: None Admission Diagnosis: Hypertensive Emergency, elevated brain natriuretic peptide.  Admitted from: Home via EMS.  Presentation:   Robin Jarvis presented with worsening chest pain, shortness of breath uncontrolled hypertension. As not been taking her medications since last year. BP 230/127 HR 66, Potassium 2.8, BNP 1,003, Troponin 34, Given potassium IV and PO, nitroglycerin gel applied, IV labetalol.  Educated the patient on the sign and symptoms of heart failure, dialy weights, when to call her doctor or go to the ER. Diet/ fluid restrictions, Patient works in [employer] and eats take out a lot, including salt. Educated about the importance of taking all medications as prescribed and attending all medical appointments. Patient is scheduled for HF TOC on 02/14/22 @ 9 am.   ECHO/ LVEF: EF 40%  Clinical Course:  Past Medical History:  Diagnosis Date   Herpes    Hypertension    Migraine      Social History   Socioeconomic History   Marital status: Single    Spouse name: Not on file   Number of children: 3   Years of education: Not on file   Highest education level: High school graduate  Occupational History   Occupation: [employer]    Comment: Full time  Tobacco Use   Smoking status: Never   Smokeless tobacco: Never  Vaping Use   Vaping Use: Never used  Substance and Sexual Activity   Alcohol use: Yes    Alcohol/week: 4.0 standard drinks of alcohol    Types: 4 Glasses of wine per week    Comment: occ   Drug use: No   Sexual activity: Yes  Other Topics Concern   Not on file  Social History Narrative   Not on file   Social Determinants of Health   Financial Resource Strain: Low Risk  (01/30/2022)   Overall Financial Resource Strain (CARDIA)    Difficulty of Paying Living Expenses: Not very hard  Food Insecurity: No Food Insecurity (01/30/2022)   Hunger Vital Sign    Worried About  Running Out of Food in the Last Year: Never true    Ran Out of Food in the Last Year: Never true  Transportation Needs: No Transportation Needs (01/30/2022)   PRAPARE - Administrator, Civil Service (Medical): No    Lack of Transportation (Non-Medical): No  Physical Activity: Not on file  Stress: Not on file  Social Connections: Not on file   Education Assessment and Provision:  Detailed education and instructions provided on heart failure disease management including the following:  Signs and symptoms of Heart Failure When to call the physician Importance of daily weights Low sodium diet Fluid restriction Medication management Anticipated future follow-up appointments  Patient education given on each of the above topics.  Patient acknowledges understanding via teach back method and acceptance of all instructions.  Education Materials:  "Living Better With Heart Failure" Booklet, HF zone tool, & Daily Weight Tracker Tool.  Patient has scale at home: Yes Patient has pill box at home: NA    High Risk Criteria for Readmission and/or Poor Patient Outcomes: Heart failure hospital admissions (last 6 months): 0  No Show rate: 0 Difficult social situation: No Demonstrates medication adherence: NO, BP medications Primary Language: English Literacy level: Reading, writing, and comprehension.   Barriers of Care:   Medication compliance Diet/ fluid restrictions ( take out/ salt) Daily weights  Considerations/Referrals:   Referral made to Heart  Failure Pharmacist Stewardship:  Referral made to Heart Failure CSW/NCM TOC: No  Referral made to Heart & Vascular TOC clinic: Yes, 02/14/22 @ 9 am  Items for Follow-up on DC/TOC: Medication compliance Diet/ fluid restrictions ( Take out/ salt) Daily weights.    Rhae Hammock, BSN, Scientist, clinical (histocompatibility and immunogenetics) Only

## 2022-01-30 NOTE — Progress Notes (Signed)
Progress Note    Robin Jarvis   DQQ:[id number]  DOB: February 08, 1976  DOA: 01/25/2022     5 PCP: System, Provider Not In  Initial CC: CP, uncontrolled BP  Hospital Course: Robin Jarvis is a 46 year old female with PMH HTN, obesity, medication noncompliance due to financial constraints, diastolic CHF who presented with chest pain and uncontrolled blood pressures.  She initially was managed in the ICU on clevidipine drip.  Cardiology was also consulted on admission. Echo performed on admission which showed EF 40%, LV global hypokinesis, severe LVH, and grade 2 diastolic dysfunction.  Interval History:  No events overnight.  Sitting on edge of bed this morning in no distress feeling well.  Explained that blood pressure still uncontrolled this morning and further medication adjustments to be made today.  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 Echo this admission shows EF 40%, grade 2 diastolic dysfunction, severe LVH and global hypokinesis of the left ventricle.  Not in acute decompensation. Cardiology following. Not a candidate for ACE/ARB/ARNI due to history of "swelling" with ACEi.  - meds further adjusted by cardiology today - continue amlodipine, Coreg, chlorthalidone, hydralazine, Imdur, spironolactone  Hypertensive emergency:  - s/p cleviprex drip on admission - now transitioned to PO regimen Weaned off Cleviprex drip. BP remains severely elevated. Continuing amlodipine and bidil and spironoloactone. Cardiology has increased dose of coreg and added chlorthalidone today. Plasma renin/aldosterone pending. Urine metanephrines pending. Will plan on outpatient cardiology hypertension clinic f/u.   Chest pain: Mildly elevated high sensitive troponins now flattened.  Likely due to demand ischemia.  Chest pain resolved.  CT coronary also shows no evidence of CAD; coronary ca score 0   Chronic kidney  disease stage II: Appears at baseline.  Baseline creatinine 0.9-1.1.   Morbid obesity: BMI 35+ hypertension/CHF   Medication noncompliance: secondary to no insurance.  TOC consulted and seeing if patient qualifies for Medicaid. Will need to send RX on discharge to [facility] pharmacy.   Old records reviewed in assessment of this patient  Antimicrobials:   DVT prophylaxis:  enoxaparin (LOVENOX) injection 40 mg Start: 01/29/22 1400   Code Status:   Code Status: Full Code  Disposition Plan:  Home 1-2 days Status is: Inpt  Objective: Blood pressure (!) 158/96, pulse 66, temperature 98.7 F (37.1 C), temperature source Oral, resp. rate 18, height 5\' 9"  (1.753 m), weight 110.3 kg, last menstrual period 12/22/2021, SpO2 99 %.  Examination:  Physical Exam Constitutional:      General: She is not in acute distress.    Appearance: She is well-developed.  HENT:     Head: Normocephalic and atraumatic.     Mouth/Throat:     Mouth: Mucous membranes are moist.  Eyes:     Extraocular Movements: Extraocular movements intact.  Cardiovascular:     Rate and Rhythm: Normal rate and regular rhythm.  Pulmonary:     Effort: Pulmonary effort is normal.     Breath sounds: Normal breath sounds.  Abdominal:     General: Bowel sounds are normal. There is no distension.     Tenderness: There is no abdominal tenderness.  Musculoskeletal:        General: No swelling. Normal range of motion.     Cervical back: Normal range of motion and neck supple.  Skin:    General: Skin is warm and dry.  Neurological:     General: No focal deficit present.     Mental  Status: She is alert.  Psychiatric:        Mood and Affect: Mood normal.        Behavior: Behavior normal.      Consultants:  Cardiology  Procedures:    Data Reviewed: Results for orders placed or performed during the hospital encounter of 01/25/22 (from the past 24 hour(s))  Basic metabolic panel     Status: Abnormal   Collection Time:  01/30/22  2:28 AM  Result Value Ref Range   Sodium 136 135 - 145 mmol/L   Potassium 3.4 (L) 3.5 - 5.1 mmol/L   Chloride 106 98 - 111 mmol/L   CO2 22 22 - 32 mmol/L   Glucose, Bld 96 70 - 99 mg/dL   BUN 16 6 - 20 mg/dL   Creatinine, Ser 2.13 (H) 0.44 - 1.00 mg/dL   Calcium 8.7 (L) 8.9 - 10.3 mg/dL   GFR, Estimated 58 (L) >60 mL/min   Anion gap 8 5 - 15    I have Reviewed nursing notes, Vitals, and Lab results since pt's last encounter. Pertinent lab results : see above I have ordered test including BMP, CBC, Mg I have reviewed the last note from staff over past 24 hours I have discussed pt's care plan and test results with nursing staff, case manager   LOS: 5 days   Lewie Chamber, MD [facility] 01/30/2022, 2:46 PM

## 2022-01-31 LAB — BASIC METABOLIC PANEL
Anion gap: 7 (ref 5–15)
BUN: 17 mg/dL (ref 6–20)
CO2: 22 mmol/L (ref 22–32)
Calcium: 9 mg/dL (ref 8.9–10.3)
Chloride: 106 mmol/L (ref 98–111)
Creatinine, Ser: 1.2 mg/dL — ABNORMAL HIGH (ref 0.44–1.00)
GFR, Estimated: 58 mL/min — ABNORMAL LOW (ref >60)
Glucose, Bld: 93 mg/dL (ref 70–99)
Potassium: 3.6 mmol/L (ref 3.5–5.1)
Sodium: 135 mmol/L (ref 135–145)

## 2022-01-31 LAB — MAGNESIUM: Magnesium: 1.9 mg/dL (ref 1.7–2.4)

## 2022-01-31 MED ORDER — POTASSIUM CHLORIDE CRYS ER 20 MEQ PO TBCR
20.0000 meq | EXTENDED_RELEASE_TABLET | Freq: Once | ORAL | Status: AC
Start: 2022-01-31 — End: 2022-01-31
  Administered 2022-01-31: 20 meq via ORAL
  Filled 2022-01-31: qty 1

## 2022-01-31 MED ORDER — SPIRONOLACTONE 25 MG PO TABS
50.0000 mg | ORAL_TABLET | Freq: Every day | ORAL | Status: DC
  Administered 2022-02-01 – 2022-02-02 (×2): 50 mg via ORAL
  Filled 2022-01-31 (×2): qty 2

## 2022-01-31 MED ORDER — SPIRONOLACTONE 25 MG PO TABS
25.0000 mg | ORAL_TABLET | Freq: Once | ORAL | Status: AC
Start: 2022-01-31 — End: 2022-01-31
  Administered 2022-01-31: 25 mg via ORAL
  Filled 2022-01-31: qty 1

## 2022-01-31 NOTE — Progress Notes (Addendum)
Heart Failure Stewardship Pharmacist Progress Note   PCP: System, Provider Not In PCP-Cardiologist: Armanda Magic, MD   HPI:  46 year old female with PMH significant for uncontrolled HTN. Presents to [facility] on 6/15 with reported 1 week of chest pain, headache, and nausea. Reports not taking hypertensive medications since last June due to lack of health insurance. On arrival to ED BP 230/127. Crt 1.16, K 2.8, BNP 1003. Troponin 36 > 34. EKG with LVH. Given Labetalol 60 mg IV total, Norvasc 10 mg, Hydralazine 50 mg tab, nitro patch, Potassium 40 meq, followed by 10 meq x 3. Decision made to transfer to [facility] for further evaluation.   EF of 40-45%, global HK, severe LVH, GIIDD, RV normal, mod-severe TR, on 01/26/2022. BP remains difficult to control. Secondary causes of HTN work-up ongoing. Coronary CT to evaluate coronary anatomy with no CAD and coronary calcium score of 0 on 01/30/2022. Team plans to refer her to Dr. Duke Salvia for potential renal denervation as an outpatient.  Discussed with patient more information about reaction to lisinopril. Patient transitioned off ?labetalol used during pregnancy to lisinopril post-partum and developed lip swelling, which was noticed in office visit and medication was stopped. Patient never developed SOB or trouble breathing and did not need to go to the hospital. She is amendable to trying an ARB if done inpatient.    Current HF Medications: Diuretic: none Beta Blocker: carvedilol 25 mg BID ACE/ARB/ARNI: none h/x of angioedema to ACEi Aldosterone Antagonist: spironolactone 25 mg daily SGLT2i: Farxiga 10 mg daily  Other: Amlodipine 10 mg daily, imdur 30 daily, hydralazine 100 q8h , chlorthalidone 25 mg daily, KCL 40 mEq x1  Prior to admission HF Medications: None - was prescribed previous medications for HTN, but was not taking due to cost issues  Pertinent Lab Values: Serum creatinine 1.2, BUN 17, Potassium 3.6, Sodium 135, BNP 1003, Magnesium  1.9, A1c 5.5% Urine catecholamines, plasma renin, aldosterone, and PRA pending  Vital Signs: Weight: weight inaccurate today (admission weight: 240 lbs) Blood pressure: 170s/105 mmHg  Heart rate: 55-65  I/O: -3.9 L yesterday; net -2.4L   Medication Assistance / Insurance Benefits Check: Does the patient have prescription insurance?  No  Does the patient qualify for medication assistance through manufacturers or grants?   Yes Eligible grants and/or patient assistance programs: [employer] Medication assistance applications in progress: [employer]  Medication assistance applications approved: pending Approved medication assistance renewals will be completed by: pending  Outpatient Pharmacy:  Prior to admission outpatient pharmacy: [facility] Is the patient willing to use [facility] TOC pharmacy at discharge? Yes Is the patient willing to transition their outpatient pharmacy to utilize a [facility] outpatient pharmacy?   Pending   Assessment: 1. Acute on chronic CHF (LVEF 40-45%), due to hypertensive cardiomyopathy. NYHA class II symptoms. - BP still above goal. Must avoid ACE with h/x of angioedema to ACEi. Would be reasonable to trial an ARB/ARNi. Patient would be a good ARNi candidate given EF < 57% and significantly elevated BP. -On all first line anti-hypertensive treatments beside ACE/ARB and add on spironolactone. Can increase both to chlorthalidone and spironolactone to 50 mg for additional BP coverage. -No room to increase carvedilol due to HR. Continue carvedilol 25 mg BID. Target dose would be 50 mg BID based upon her weight. May be able to increase outpatient in th future.  -Agree with splitting of BiDil to components. Patient on imdur 30 daily and hydralazine 100 mg q8h currently. Can increase imdur to 30 BID,  however would wait until follow-up due to possibility of HA with increasing imdur too quickly.    Plan: 1) Medication changes recommended at this time: -Recommend trial of valsartan  80 mg BID while in the hospital if team/patient not amendable  -Increase spironolactone to 50 mg daily  2) Patient assistance: -Farxiga application completed 01/30/2022  -plan for MATCH at discharge, patient approved  -Financial navigators screening for Medicaid eligibility  -SW to talk with patient and plan f/u with community health and wellness for help with cost of medications and doctor copays while insurance pending  3)  Education  - Patient has been educated on current HF medications and potential additions to HF medication regimen - Patient verbalizes understanding that over the next few months, these medication doses may change and more medications may be added to optimize HF regimen - Patient has been educated on basic disease state pathophysiology and goals of therapy  Drake Leach, PharmD, BCPS PGY2 Cardiology Pharmacy Resident

## 2022-01-31 NOTE — Progress Notes (Addendum)
Progress Note  Patient Name: Robin Jarvis Date of Encounter: 01/31/2022  [facility] Cardiologist: Fransico Him, MD   Subjective   Feeling well. No chest pain, sob or palpitations.   Blood pressure remain elevated.   Inpatient Medications    Scheduled Meds:  amLODipine  10 mg Oral Daily   carvedilol  25 mg Oral BID WC   chlorthalidone  25 mg Oral Daily   dapagliflozin propanediol  10 mg Oral Daily   enoxaparin (LOVENOX) injection  40 mg Subcutaneous Q24H   hydrALAZINE  100 mg Oral Q8H   isosorbide mononitrate  30 mg Oral Daily   spironolactone  25 mg Oral Daily   Continuous Infusions:  PRN Meds: acetaminophen, docusate sodium, hydrALAZINE, ondansetron (ZOFRAN) IV, polyethylene glycol, white petrolatum   Vital Signs    Vitals:   01/30/22 1643 01/30/22 1703 01/30/22 2026 01/31/22 0533  BP:  (!) 174/97 (!) 143/71 (!) 172/107  Pulse: 60 65 (!) 59 67  Resp:   16 16  Temp: 98.5 F (36.9 C)  98.4 F (36.9 C) 98.8 F (37.1 C)  TempSrc: Oral  Oral Oral  SpO2: 100%     Weight:    113.5 kg  Height:        Intake/Output Summary (Last 24 hours) at 01/31/2022 0953 Last data filed at 01/30/2022 1839 Gross per 24 hour  Intake --  Output 3150 ml  Net -3150 ml      01/31/2022    5:33 AM 01/30/2022    5:46 AM 01/28/2022    1:00 AM  Last 3 Weights  Weight (lbs) 250 lb 3.6 oz 243 lb 3.2 oz 250 lb 3.6 oz  Weight (kg) 113.5 kg 110.315 kg 113.5 kg      Telemetry    SR, NSVT (8 beats), PVC - Personally Reviewed  ECG    N/A - Personally Reviewed  Physical Exam   GEN: No acute distress.   Neck: No JVD Cardiac: RRR, no murmurs, rubs, or gallops.  Respiratory: Clear to auscultation bilaterally. GI: Soft, nontender, non-distended  MS: No edema; No deformity. Neuro:  Nonfocal  Psych: Normal affect   Labs    High Sensitivity Troponin:   Recent Labs  Lab 01/25/22 1000 01/25/22 1212  TROPONINIHS 36* 34*     Chemistry Recent Labs  Lab 01/28/22 1112  01/29/22 0327 01/30/22 0228 01/31/22 0127  NA 135 135 136 135  K 3.8 3.7 3.4* 3.6  CL 104 105 106 106  CO2 23 22 22 22   GLUCOSE 92 88 96 93  BUN 15 18 16 17   CREATININE 1.09* 1.12* 1.19* 1.20*  CALCIUM 8.1* 8.3* 8.7* 9.0  MG 2.0 2.0  --  1.9  GFRNONAA >60 >60 58* 58*  ANIONGAP 8 8 8 7     Lipids  Recent Labs  Lab 01/25/22 1814  CHOL 151  TRIG 75  HDL 36*  LDLCALC 100*  CHOLHDL 4.2    Hematology Recent Labs  Lab 01/27/22 0225 01/28/22 0001 01/29/22 0327  WBC 11.3* 12.1* 10.5  RBC 4.25 3.84* 4.03  HGB 13.2 12.2 12.5  HCT 40.4 36.5 38.1  MCV 95.1 95.1 94.5  MCH 31.1 31.8 31.0  MCHC 32.7 33.4 32.8  RDW 15.1 14.6 14.4  PLT 273 244 242   Thyroid No results for input(s): "TSH", "FREET4" in the last 168 hours.  BNP Recent Labs  Lab 01/25/22 1000  BNP 1,003.2*    DDimer No results for input(s): "DDIMER" in the last 168 hours.  Radiology    VAS US RENAL ARTERY DUPLEX  Result Date: 01/30/2022 ABDOMINAL VISCERAL Patient Name:  DENNETTE HEDEEN  Date of Exam:   01/30/2022 Medical Rec #: [id number]    Accession #:    [id number] Date of Birth: 04-29-1976    Patient Gender: F Patient Age:   5 years Exam Location:  [facility] Procedure:      VAS US RENAL ARTERY DUPLEX Referring Phys: [id number] [place] -------------------------------------------------------------------------------- High Risk Factors: Hypertension, no history of smoking. Other Factors: CHF, CKD2. Limitations: Obesity. Comparison Study: Previous exam 11/12/19 was WNL Performing Technologist: Rogelia Rohrer RVT, RDMS  Examination Guidelines: A complete evaluation includes B-mode imaging, spectral Doppler, color Doppler, and power Doppler as needed of all accessible portions of each vessel. Bilateral testing is considered an integral part of a complete examination. Limited examinations for reoccurring indications may be performed as noted.  Duplex Findings: +--------------------+--------+--------+------+--------+  Mesenteric          PSV cm/sEDV cm/sPlaqueComments +--------------------+--------+--------+------+--------+ Aorta Prox             87      12                  +--------------------+--------+--------+------+--------+ Celiac Artery Origin  109                          +--------------------+--------+--------+------+--------+ SMA Origin            278      10                  +--------------------+--------+--------+------+--------+ SMA Proximal          241      12                  +--------------------+--------+--------+------+--------+ SMA Mid               135      0                   +--------------------+--------+--------+------+--------+    +------------------+--------+--------+-------+ Right Renal ArteryPSV cm/sEDV cm/sComment +------------------+--------+--------+-------+ Origin              139      30           +------------------+--------+--------+-------+ Proximal             82      21           +------------------+--------+--------+-------+ Mid                  92      26           +------------------+--------+--------+-------+ Distal               66      17           +------------------+--------+--------+-------+ +-----------------+--------+--------+-------+ Left Renal ArteryPSV cm/sEDV cm/sComment +-----------------+--------+--------+-------+ Origin              84      20           +-----------------+--------+--------+-------+ Proximal            83      21           +-----------------+--------+--------+-------+ Mid                 78      19           +-----------------+--------+--------+-------+  Distal              73      13           +-----------------+--------+--------+-------+ +------------+--------+--------+----+-----------+--------+--------+----+ Right KidneyPSV cm/sEDV cm/sRI  Left KidneyPSV cm/sEDV cm/sRI   +------------+--------+--------+----+-----------+--------+--------+----+ Upper Pole                       Upper Pole 20      5       0.75 +------------+--------+--------+----+-----------+--------+--------+----+ Mid         16      5       0.        17      5       0.73 +------------+--------+--------+----+-----------+--------+--------+----+ Lower Pole  17      5       0.71Lower Pole 18      5       0.74 +------------+--------+--------+----+-----------+--------+--------+----+ Hilar       20      6       0.70Hilar      24      7       0.72 +------------+--------+--------+----+-----------+--------+--------+----+ +------------------+-----+------------------+-----+ Right Kidney           Left Kidney             +------------------+-----+------------------+-----+ RAR                    RAR                     +------------------+-----+------------------+-----+ RAR (manual)      1.60 RAR (manual)      0.97  +------------------+-----+------------------+-----+ Cortex            17/3 Cortex            12/4  +------------------+-----+------------------+-----+ Cortex thickness       Corex thickness         +------------------+-----+------------------+-----+ Kidney length (cm)13.20Kidney length (cm)12.10 +------------------+-----+------------------+-----+  Summary: Renal:  Right: No evidence of right renal artery stenosis. Abnormal size for        the right kidney. Abnormal right Resistive Index. RRV flow        present. Left:  No evidence of left renal artery stenosis. Normal size of        left kidney. Abnormal left Resisitve Index. LRV flow present. Mesenteric: Normal Celiac artery findings. 70 to 99% stenosis in the superior mesenteric artery.  *See table(s) above for measurements and observations.  Diagnosing physician: Waverly Ferrari MD  Electronically signed by Waverly Ferrari MD on 01/30/2022 at 4:47:45 PM.    Final    CT CORONARY MORPH W/CTA COR W/SCORE W/CA W/CM &/OR WO/CM  Addendum Date: 01/29/2022   ADDENDUM REPORT: 01/29/2022  16:38 HISTORY: 46 yo female, non-ischemic cardiomyopathy suspected EXAM: Cardiac/Coronary CTA TECHNIQUE: The patient was scanned on a Office manager. PROTOCOL: A 90 kV prospective scan was triggered in the descending thoracic aorta at 111 HU's. Axial non-contrast 3 mm slices were carried out through the heart. The data set was analyzed on a dedicated work station and scored using the Agatson method. Gantry rotation speed was 250 msecs and collimation was .6 mm. Beta blockade and 0.8 mg of sl NTG was given. The 3D data set was reconstructed in 5% intervals of the 35-75 % of the R-R cycle. Diastolic phases were analyzed on a dedicated work station using MPR, MIP and VRT modes. The patient received  7mL OMNIPAQUE IOHEXOL 350 MG/ML SOLN of contrast. FINDINGS: Quality: Good, attenuation artifact, HR 60 Coronary calcium score: The patient's coronary artery calcium score is 0, which places the patient in the 0 percentile. Coronary arteries: Normal coronary origins.  Right dominance. Right Coronary Artery: Dominant.  Normal vessel. Left Main Coronary Artery: Normal. Bifurcates into the LAD and LCx arteries. Left Anterior Descending Coronary Artery: Anterior artery that reaches the apex. No disease. D1 and D2 branches without disease. Left Circumflex Artery: Proximal to mid vessel without disease. The distal vessel is poorly visualized. Aorta: Normal size, 35 mm at the mid ascending aorta (level of the PA bifurcation) measured double oblique. No calcifications. No dissection. Aortic Valve: Trileaflet. No calcifications. Other findings: Normal pulmonary vein drainage into the left atrium. Normal left atrial appendage without a thrombus. Dilated main pulmonary artery at 37 mm, suggestive of pulmonary hypertension. IMPRESSION: 1. No evidence of CAD, CADRADS = 0. 2. Coronary calcium score of 0. This was 0 percentile for age and sex matched control. 3. Normal coronary origin with right dominance. 4. Dilated main pulmonary  artery at 37 mm, suggestive of pulmonary hypertension. Electronically Signed   By: Chrystie Nose M.D.   On: 01/29/2022 16:38   Result Date: 01/29/2022 EXAM: OVER-READ INTERPRETATION  CT CHEST The following report is a limited chest CT over-read performed by radiologist Dr. Genevive Bi of [facility] on 01/29/2022. The CTA interpretation by the cardiologist is attached. COMPARISON:  None Available. FINDINGS: Limited view of the lung parenchyma demonstrates no suspicious nodularity. Airways are normal. Limited view of the mediastinum demonstrates no adenopathy. Esophagus normal. Limited view of the upper abdomen unremarkable. Limited view of the skeleton and chest wall is unremarkable. IMPRESSION: No significant extracardiac findings. Electronically Signed: By: Genevive Bi M.D. On: 01/29/2022 16:04    Cardiac Studies   Echocardiogram 01/26/2022: Impressions:  1. Left ventricular ejection fraction, by estimation, is 40%. The left  ventricle has mildly decreased function. The left ventricle demonstrates  global hypokinesis. The left ventricular internal cavity size was mildly  dilated. There is severe left  ventricular hypertrophy. Left ventricular diastolic parameters are  consistent with Grade II diastolic dysfunction (pseudonormalization).  Elevated left ventricular end-diastolic pressure.   2. Right ventricular systolic function is normal. The right ventricular  size is normal. There is moderately elevated pulmonary artery systolic  pressure.   3. Left atrial size was severely dilated.   4. The pericardial effusion is posterior to the left ventricle.   5. The mitral valve is abnormal. Mild mitral valve regurgitation. No  evidence of mitral stenosis.   6. Tricuspid valve regurgitation is moderate to severe.   7. The aortic valve is tricuspid. There is mild calcification of the  aortic valve. There is mild thickening of the aortic valve. Aortic valve  regurgitation is  trivial. Aortic valve sclerosis is present, with no  evidence of aortic valve stenosis.   8. The inferior vena cava is dilated in size with <50% respiratory  variability, suggesting right atrial pressure of 15 mmHg.  _______________   Coronary CTA 01/29/2022: IMPRESSION: 1. No evidence of CAD, CADRADS = 0.   2. Coronary calcium score of 0. This was 0 percentile for age and sex matched control.   3. Normal coronary origin with right dominance.   4. Dilated main pulmonary artery at 37 mm, suggestive of pulmonary hypertension.  Patient Profile    46 y.o. female with a history of uncontrolled hypertension who was admitted on 01/25/2022 with  hypertensive emergency after presenting with 1 weeks of chest pain, headache, and nausea and being found to have BP of 230/127. Cardiology was consulted for further evaluation of this and dilated cardiomyopathy.  Assessment & Plan  Hypertensive Emergency - Patient has a long history of hypertension and had not been on any medications in over a year due to financial issues. Previous secondary hypertension work-up in 2021 showed mildly elevated 14 hours normetanephrine and metanephrine and mildly elevated PRA of 8.2 (normal <5.38). Renal artery dopplers in 2021 was normal.  - BP as high as 230/127 on arrival. -  Patient has been restarted on multiple oral medications but BP remains markedly elevated. - Renal doppler without evidence of renal artery stenosis bilaterally .  - Repeat urine catecholamines and plasma renin, aldosterone, and PRA pending. - Continue Amlodipine 10mg  daily, Coreg 25mg  twice daily, Chlorthalidone 25mg  daily, Spironolactone 25mg  daily, Hydralazine 100mg  three times daily and Imdur 30mg  daily - Increase Spironolactone to 50 mg daily,  - Plan is to have her follow-up [facility]. May be a candidate for renal artery denervation procedure.   Non-Ischemic Cardiomyopathy - Echo this admission showed LVEF of 40%  with global hypokinesis and grade 2 diastolic dysfunction, normal RV, severe left atrial enlargement, mild MR, moderate to severe TR, and moderately elevated PASP. - Coronary CTA showed coronary calcium score of 0 with no evidence of CAD. - Continue Coreg, Spironolactone, Hydralazine, and Imdur as above. - No on ACEi/ARB/ARNI due to history of angioedema on Lisinopril. - Continue Farxiga 10mg  daily. - Will likely need repeat Echo in a few months once we have BP better controlled to reassess LV function and valvular disease.   Chest Pain Demand Ischemia  Patient reported chest pain with exertion and emotion stress. High-sensitivity troponin minimally elevated and flat at 36 >> 34. Not consistent with ACS. Echo showed LVEF of 40%. Coronary CTA showed coronary calcium score of 0 with no CAD. - Current chest pain free. - Likely due to markedly elevated hypertension. No additional work-up necessary.  NSVT  - 8 beats - K 3.6 & MG 1.9 - Increase spiro to 50mg  qd   For questions or updates, please contact [place] Please consult [email or web address] for contact info under        SignedLeanor Kail, PA  01/31/2022, 9:53 AM

## 2022-01-31 NOTE — Progress Notes (Signed)
Progress Note    Robin Jarvis   [id number]  DOB: 10-Mar-1976  DOA: 01/25/2022     6 PCP: System, Provider Not In  Initial CC: CP, uncontrolled BP  Hospital Course: Robin Jarvis is a 46 year old female with PMH HTN, obesity, medication noncompliance due to financial constraints, diastolic CHF who presented with chest pain and uncontrolled blood pressures.  She initially was managed in the ICU on clevidipine drip.  Cardiology was also consulted on admission. Echo performed on admission which showed EF 40%, LV global hypokinesis, severe LVH, and grade 2 diastolic dysfunction.  Interval History:  No events overnight. BP still above goal this morning, but showing improvement in general. Provided empathetic listening as she was aggravated for still having to remain in hospital another night again.  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 Echo this admission shows EF 40%, grade 2 diastolic dysfunction, severe LVH and global hypokinesis of the left ventricle.  Not in acute decompensation. Cardiology following. Not a candidate for ACE/ARB/ARNI due to history of "swelling" with ACEi.  - meds further adjusted by cardiology today - continue amlodipine, Coreg, chlorthalidone, hydralazine, Imdur, spironolactone - continue farxiga; medication assistance being pursued by pharmacy   Hypertensive emergency:  - s/p cleviprex drip on admission - now transitioned to PO regimen Weaned off Cleviprex drip. BP remains not at goal yet.  - follow up renin/aldo and urine metanephrines; typically takes several days   Chest pain: Mildly elevated high sensitive troponins now flattened.  Likely due to demand ischemia.  Chest pain resolved.  CT coronary also shows no evidence of CAD; coronary ca score 0   Chronic kidney disease stage II: Appears at baseline.  Baseline creatinine  0.9-1.1.   Morbid obesity: BMI 35+ hypertension/CHF   Medication noncompliance: secondary to no insurance.  TOC consulted and seeing if patient qualifies for Medicaid. Will need to send RX on discharge to [facility] pharmacy.   Old records reviewed in assessment of this patient  Antimicrobials:   DVT prophylaxis:  enoxaparin (LOVENOX) injection 40 mg Start: 01/29/22 1400   Code Status:   Code Status: Full Code  Disposition Plan:  Home 1-2 days Status is: Inpt  Objective: Blood pressure (!) 158/82, pulse 60, temperature 98.8 F (37.1 C), temperature source Oral, resp. rate 16, height 5\' 9"  (1.753 m), weight 113.5 kg, last menstrual period 12/22/2021, SpO2 99 %.  Examination:  Physical Exam Constitutional:      General: She is not in acute distress.    Appearance: She is well-developed.  HENT:     Head: Normocephalic and atraumatic.     Mouth/Throat:     Mouth: Mucous membranes are moist.  Eyes:     Extraocular Movements: Extraocular movements intact.  Cardiovascular:     Rate and Rhythm: Normal rate and regular rhythm.  Pulmonary:     Effort: Pulmonary effort is normal.     Breath sounds: Normal breath sounds.  Abdominal:     General: Bowel sounds are normal. There is no distension.     Tenderness: There is no abdominal tenderness.  Musculoskeletal:        General: Normal range of motion.     Cervical back: Normal range of motion and neck supple.     Comments: 1+ RLE edema; trace edema in LLE  Skin:    General: Skin is warm and dry.  Neurological:     General: No  focal deficit present.     Mental Status: She is alert.  Psychiatric:        Mood and Affect: Mood normal.        Behavior: Behavior normal.      Consultants:  Cardiology  Procedures:    Data Reviewed: Results for orders placed or performed during the hospital encounter of 01/25/22 (from the past 24 hour(s))  Basic metabolic panel     Status: Abnormal   Collection Time: 01/31/22  1:27 AM  Result  Value Ref Range   Sodium 135 135 - 145 mmol/L   Potassium 3.6 3.5 - 5.1 mmol/L   Chloride 106 98 - 111 mmol/L   CO2 22 22 - 32 mmol/L   Glucose, Bld 93 70 - 99 mg/dL   BUN 17 6 - 20 mg/dL   Creatinine, Ser 6.28 (H) 0.44 - 1.00 mg/dL   Calcium 9.0 8.9 - 36.6 mg/dL   GFR, Estimated 58 (L) >60 mL/min   Anion gap 7 5 - 15  Magnesium     Status: None   Collection Time: 01/31/22  1:27 AM  Result Value Ref Range   Magnesium 1.9 1.7 - 2.4 mg/dL    I have Reviewed nursing notes, Vitals, and Lab results since pt's last encounter. Pertinent lab results : see above I have ordered test including BMP, CBC, Mg I have reviewed the last note from staff over past 24 hours I have discussed pt's care plan and test results with nursing staff, case manager   LOS: 6 days   Lewie Chamber, MD [facility] 01/31/2022, 4:28 PM

## 2022-02-01 LAB — BASIC METABOLIC PANEL
Anion gap: 12 (ref 5–15)
BUN: 20 mg/dL (ref 6–20)
CO2: 21 mmol/L — ABNORMAL LOW (ref 22–32)
Calcium: 9.2 mg/dL (ref 8.9–10.3)
Chloride: 102 mmol/L (ref 98–111)
Creatinine, Ser: 1.33 mg/dL — ABNORMAL HIGH (ref 0.44–1.00)
GFR, Estimated: 51 mL/min — ABNORMAL LOW (ref >60)
Glucose, Bld: 88 mg/dL (ref 70–99)
Potassium: 3.7 mmol/L (ref 3.5–5.1)
Sodium: 135 mmol/L (ref 135–145)

## 2022-02-01 LAB — MAGNESIUM: Magnesium: 1.9 mg/dL (ref 1.7–2.4)

## 2022-02-01 MED ORDER — CLONIDINE HCL 0.1 MG PO TABS
0.1000 mg | ORAL_TABLET | Freq: Two times a day (BID) | ORAL | Status: DC
  Administered 2022-02-01 – 2022-02-02 (×3): 0.1 mg via ORAL
  Filled 2022-02-01 (×3): qty 1

## 2022-02-01 NOTE — Progress Notes (Signed)
Heart Failure Stewardship Pharmacist Progress Note   PCP: System, Provider Not In PCP-Cardiologist: Armanda Magic, MD   HPI:  46 year old female with PMH significant for uncontrolled HTN. Presents to [facility] on 6/15 with reported 1 week of chest pain, headache, and nausea. Reports not taking hypertensive medications since last June due to lack of health insurance. On arrival to ED BP 230/127. Crt 1.16, K 2.8, BNP 1003. Troponin 36 > 34. EKG with LVH. Given Labetalol 60 mg IV total, Norvasc 10 mg, Hydralazine 50 mg tab, nitro patch, Potassium 40 meq, followed by 10 meq x 3. Decision made to transfer to [facility] for further evaluation.   EF of 40-45%, global HK, severe LVH, GIIDD, RV normal, mod-severe TR, on 01/26/2022. BP remains difficult to control. Secondary causes of HTN work-up ongoing. Coronary CT to evaluate coronary anatomy with no CAD and coronary calcium score of 0 on 01/30/2022. Team plans to refer her to Dr. Duke Salvia for potential renal denervation as an outpatient.  Discussed with patient more information about reaction to lisinopril. Patient transitioned off ?labetalol used during pregnancy to lisinopril post-partum and developed lip swelling, which was noticed in office visit and medication was stopped. Patient never developed SOB or trouble breathing and did not need to go to the hospital. She is amendable to trying an ARB if done inpatient.    Current HF Medications: Diuretic: none Beta Blocker: carvedilol 25 mg BID ACE/ARB/ARNI: none h/x of angioedema to ACEi Aldosterone Antagonist: spironolactone 50 mg daily SGLT2i: Farxiga 10 mg daily  Other: Amlodipine 10 mg daily, imdur 30 daily, hydralazine 100 q8h , chlorthalidone 25 mg daily, clonidine 0.1 mg BID  Prior to admission HF Medications: None - was prescribed previous medications for HTN, but was not taking due to cost issues  Pertinent Lab Values: Serum creatinine 1.33, BUN 20, Potassium 3.7, Sodium 135, BNP 1003,  Magnesium 1.9, A1c 5.5% Urine catecholamines, plasma renin, aldosterone, and PRA pending  Vital Signs: Weight:no weight today, inaccurate yesterday (admission weight: 240 lbs) Blood pressure: 151/90-190/110 Heart rate: 55-65  I/O: not documented   Medication Assistance / Insurance Benefits Check: Does the patient have prescription insurance?  No  Does the patient qualify for medication assistance through manufacturers or grants?   Yes Eligible grants and/or patient assistance programs: Farxiga Medication assistance applications in progress: Farxiga  Medication assistance applications approved: pending Approved medication assistance renewals will be completed by: pending  Outpatient Pharmacy:  Prior to admission outpatient pharmacy: [facility] Is the patient willing to use [facility] TOC pharmacy at discharge? Yes Is the patient willing to transition their outpatient pharmacy to utilize a [facility] outpatient pharmacy?   Pending   Assessment: 1. Acute on chronic CHF (LVEF 40-45%), due to hypertensive cardiomyopathy. NYHA class II symptoms. - BP still above goal. Must avoid ACE with h/x of angioedema to ACEi. Would be reasonable to trial an ARB/ARNi. Patient would be a good ARNi candidate given EF < 57% and significantly elevated BP. -On all first line anti-hypertensive treatments beside ACE/ARB and add on spironolactone. -No room to increase carvedilol due to HR. Continue carvedilol 25 mg BID. Target dose would be 50 mg BID based upon her weight. May be able to increase outpatient in th future.  -Agree with addition of clonidine for additional BP. Can consider transition to TD patch for easier administration once titration is complete. -Can increase imdur to 30 BID, however would wait until follow-up due to possibility of HA with increasing imdur too quickly.  -  Can increase chlorthalidone to 50 mg for additional BP coverage. -Could consider trial of amlodipine at PM as opposed to morning based  upon the results of the Hygia trial indicating better BP control at night    Plan: 1) Medication changes recommended at this time: -Recommend trial of valsartan 80 mg BID while in the hospital if team/patient amendable  -Could consider barostim over renal denervation given contaminant HF  2) Patient assistance: -Farxiga application completed 01/30/2022  -plan for MATCH at discharge, patient approved  -Financial navigators screening for Medicaid eligibility  -SW to talk with patient and plan f/u with community health and wellness for help with cost of medications and doctor copays while insurance pending  3)  Education  - Patient has been educated on current HF medications and potential additions to HF medication regimen - Patient verbalizes understanding that over the next few months, these medication doses may change and more medications may be added to optimize HF regimen - Patient has been educated on basic disease state pathophysiology and goals of therapy  Drake Leach, PharmD, BCPS PGY2 Cardiology Pharmacy Resident

## 2022-02-01 NOTE — Progress Notes (Signed)
Progress Note    Robin Jarvis   XNA:[id number]  DOB: 1976/04/22  DOA: 01/25/2022     7 PCP: System, Provider Not In  Initial CC: CP, uncontrolled BP  Hospital Course: Robin Jarvis is a 46 year old female with PMH HTN, obesity, medication noncompliance due to financial constraints, diastolic CHF who presented with chest pain and uncontrolled blood pressures.  She initially was managed in the ICU on clevidipine drip.  Cardiology was also consulted on admission. Echo performed on admission which showed EF 40%, LV global hypokinesis, severe LVH, and grade 2 diastolic dysfunction.  Interval History:  Still having further BP regimen adjustments and BP remains unfortunately above goal. She remains asymptomatic.   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 Echo this admission shows EF 40%, grade 2 diastolic dysfunction, severe LVH and global hypokinesis of the left ventricle.  Not in acute decompensation. Cardiology following. Not a candidate for ACE/ARB/ARNI due to history of "swelling" with ACEi.  - meds further adjusted by cardiology today - continue amlodipine, Coreg, chlorthalidone, clonidine (added 6/22), hydralazine, Imdur, spironolactone - continue farxiga; medication assistance being pursued by pharmacy   Hypertensive emergency:  - s/p cleviprex drip on admission - now transitioned to PO regimen Weaned off Cleviprex drip. BP remains not at goal yet.  - follow up renin/aldo and urine metanephrines; typically takes several days   Chest pain: Mildly elevated high sensitive troponins now flattened.  Likely due to demand ischemia.  Chest pain resolved.  CT coronary also shows no evidence of CAD; coronary ca score 0   Chronic kidney disease stage II: Appears at baseline.  Baseline creatinine 0.9-1.1.   Morbid obesity: BMI 35+ hypertension/CHF   Medication noncompliance: secondary to no  insurance.  TOC consulted and seeing if patient qualifies for Medicaid. Will need to send RX on discharge to [facility] pharmacy.   Old records reviewed in assessment of this patient  Antimicrobials:   DVT prophylaxis:  enoxaparin (LOVENOX) injection 40 mg Start: 01/29/22 1400   Code Status:   Code Status: Full Code  Disposition Plan:  Home 1-2 days Status is: Inpt  Objective: Blood pressure (!) 161/110, pulse 62, temperature 98.6 F (37 C), temperature source Oral, resp. rate 16, height 5\' 9"  (1.753 m), weight 113.5 kg, last menstrual period 12/22/2021, SpO2 100 %.  Examination:  Physical Exam Constitutional:      General: She is not in acute distress.    Appearance: She is well-developed.  HENT:     Head: Normocephalic and atraumatic.     Mouth/Throat:     Mouth: Mucous membranes are moist.  Eyes:     Extraocular Movements: Extraocular movements intact.  Cardiovascular:     Rate and Rhythm: Normal rate and regular rhythm.  Pulmonary:     Effort: Pulmonary effort is normal.     Breath sounds: Normal breath sounds.  Abdominal:     General: Bowel sounds are normal. There is no distension.     Tenderness: There is no abdominal tenderness.  Musculoskeletal:        General: Normal range of motion.     Cervical back: Normal range of motion and neck supple.     Comments: 1+ RLE edema; trace edema in LLE  Skin:    General: Skin is warm and dry.  Neurological:     General: No focal deficit present.     Mental Status: She is alert.  Psychiatric:  Mood and Affect: Mood normal.        Behavior: Behavior normal.      Consultants:  Cardiology  Procedures:    Data Reviewed: Results for orders placed or performed during the hospital encounter of 01/25/22 (from the past 24 hour(s))  Basic metabolic panel     Status: Abnormal   Collection Time: 02/01/22  1:18 AM  Result Value Ref Range   Sodium 135 135 - 145 mmol/L   Potassium 3.7 3.5 - 5.1 mmol/L   Chloride 102 98 -  111 mmol/L   CO2 21 (L) 22 - 32 mmol/L   Glucose, Bld 88 70 - 99 mg/dL   BUN 20 6 - 20 mg/dL   Creatinine, Ser 8.65 (H) 0.44 - 1.00 mg/dL   Calcium 9.2 8.9 - 78.4 mg/dL   GFR, Estimated 51 (L) >60 mL/min   Anion gap 12 5 - 15  Magnesium     Status: None   Collection Time: 02/01/22  1:18 AM  Result Value Ref Range   Magnesium 1.9 1.7 - 2.4 mg/dL    I have Reviewed nursing notes, Vitals, and Lab results since pt's last encounter. Pertinent lab results : see above I have ordered test including BMP, CBC, Mg I have reviewed the last note from staff over past 24 hours I have discussed pt's care plan and test results with nursing staff, case manager   LOS: 7 days   Lewie Chamber, MD [facility] 02/01/2022, 3:03 PM

## 2022-02-01 NOTE — Plan of Care (Signed)
°  Problem: Education: °Goal: Knowledge of General Education information will improve °Description: Including pain rating scale, medication(s)/side effects and non-pharmacologic comfort measures °Outcome: Progressing °  °Problem: Health Behavior/Discharge Planning: °Goal: Ability to manage health-related needs will improve °Outcome: Progressing °  °Problem: Clinical Measurements: °Goal: Cardiovascular complication will be avoided °Outcome: Progressing °  °

## 2022-02-02 ENCOUNTER — Telehealth (HOSPITAL_BASED_OUTPATIENT_CLINIC_OR_DEPARTMENT_OTHER): Payer: Self-pay | Admitting: *Deleted

## 2022-02-02 ENCOUNTER — Other Ambulatory Visit (HOSPITAL_COMMUNITY): Payer: Self-pay

## 2022-02-02 ENCOUNTER — Encounter: Payer: Self-pay | Admitting: Internal Medicine

## 2022-02-02 LAB — BASIC METABOLIC PANEL
Anion gap: 8 (ref 5–15)
BUN: 24 mg/dL — ABNORMAL HIGH (ref 6–20)
CO2: 22 mmol/L (ref 22–32)
Calcium: 9.2 mg/dL (ref 8.9–10.3)
Chloride: 104 mmol/L (ref 98–111)
Creatinine, Ser: 1.47 mg/dL — ABNORMAL HIGH (ref 0.44–1.00)
GFR, Estimated: 45 mL/min — ABNORMAL LOW (ref >60)
Glucose, Bld: 99 mg/dL (ref 70–99)
Potassium: 3.6 mmol/L (ref 3.5–5.1)
Sodium: 134 mmol/L — ABNORMAL LOW (ref 135–145)

## 2022-02-02 LAB — MAGNESIUM: Magnesium: 2 mg/dL (ref 1.7–2.4)

## 2022-02-02 MED ORDER — ISOSORBIDE MONONITRATE ER 30 MG PO TB24
30.0000 mg | ORAL_TABLET | Freq: Every day | ORAL | 3 refills | Status: DC
  Filled 2022-02-02: qty 30, 30d supply, fill #0
  Filled 2022-02-27: qty 30, 30d supply, fill #1
  Filled 2022-03-29: qty 30, 30d supply, fill #2
  Filled 2022-04-28: qty 30, 30d supply, fill #3

## 2022-02-02 MED ORDER — DAPAGLIFLOZIN PROPANEDIOL 10 MG PO TABS
10.0000 mg | ORAL_TABLET | Freq: Every day | ORAL | 3 refills | Status: AC
Start: 2022-02-03 — End: ?
  Filled 2022-02-02: qty 30, 30d supply, fill #0
  Filled 2022-02-27: qty 30, 30d supply, fill #1

## 2022-02-02 MED ORDER — CLONIDINE HCL 0.2 MG PO TABS
0.2000 mg | ORAL_TABLET | Freq: Three times a day (TID) | ORAL | 3 refills | Status: DC
  Filled 2022-02-02: qty 90, 30d supply, fill #0

## 2022-02-02 MED ORDER — SPIRONOLACTONE 25 MG PO TABS: 25.0000 mg | ORAL_TABLET | Freq: Every day | ORAL | Status: DC

## 2022-02-02 MED ORDER — AMLODIPINE BESYLATE 10 MG PO TABS
10.0000 mg | ORAL_TABLET | Freq: Every day | ORAL | 3 refills | Status: DC
Start: 2022-02-02 — End: 2022-02-02
  Filled 2022-02-02: qty 30, 30d supply, fill #0

## 2022-02-02 MED ORDER — CHLORTHALIDONE 25 MG PO TABS
25.0000 mg | ORAL_TABLET | Freq: Every day | ORAL | 3 refills | Status: DC
  Filled 2022-02-02: qty 30, 30d supply, fill #0
  Filled 2022-02-27: qty 30, 30d supply, fill #1
  Filled 2022-03-29: qty 30, 30d supply, fill #2
  Filled 2022-04-28: qty 30, 30d supply, fill #3

## 2022-02-02 MED ORDER — AMLODIPINE BESYLATE 10 MG PO TABS: 10.0000 mg | ORAL_TABLET | Freq: Every day | ORAL | 3 refills | Status: DC

## 2022-02-02 MED ORDER — SPIRONOLACTONE 25 MG PO TABS
25.0000 mg | ORAL_TABLET | Freq: Every day | ORAL | 3 refills | Status: DC
  Filled 2022-02-02: qty 30, 30d supply, fill #0
  Filled 2022-02-27: qty 30, 30d supply, fill #1
  Filled 2022-03-29: qty 30, 30d supply, fill #2
  Filled 2022-04-28: qty 30, 30d supply, fill #3

## 2022-02-02 MED ORDER — HYDRALAZINE HCL 100 MG PO TABS
100.0000 mg | ORAL_TABLET | Freq: Three times a day (TID) | ORAL | 3 refills | Status: DC
  Filled 2022-02-02: qty 90, 30d supply, fill #0
  Filled 2022-02-27: qty 90, 30d supply, fill #1
  Filled 2022-03-29: qty 90, 30d supply, fill #2
  Filled 2022-04-28: qty 89, 29d supply, fill #3
  Filled 2022-04-30: qty 1, 1d supply, fill #3

## 2022-02-02 MED ORDER — CARVEDILOL 25 MG PO TABS
25.0000 mg | ORAL_TABLET | Freq: Two times a day (BID) | ORAL | 3 refills | Status: DC
  Filled 2022-02-02: qty 60, 30d supply, fill #0
  Filled 2022-02-27: qty 60, 30d supply, fill #1
  Filled 2022-03-29: qty 60, 30d supply, fill #2
  Filled 2022-04-28: qty 60, 30d supply, fill #3

## 2022-02-02 MED ORDER — CLONIDINE HCL 0.2 MG PO TABS: 0.2000 mg | ORAL_TABLET | Freq: Three times a day (TID) | ORAL | Status: DC

## 2022-02-02 NOTE — Telephone Encounter (Signed)
Discussed with Ronn Melena NP with ADV HTN  Ok to put on scheduled next week with her since Dr Leonides Sake will out of the office and her next available ADV HTN appointment in September (she only ADV HTN clinic's 3 days prior to then)   Left message to call back

## 2022-02-03 LAB — METANEPHRINES, URINE, 24 HOUR
Metaneph Total, Ur: 72 ug/L
Metanephrines, 24H Ur: 230 ug/24 hr — ABNORMAL HIGH (ref 36–209)
Normetanephrine, 24H Ur: 854 ug/24 hr — ABNORMAL HIGH (ref 131–612)
Normetanephrine, Ur: 267 ug/L
Total Volume: 3200

## 2022-02-05 ENCOUNTER — Ambulatory Visit: Payer: Self-pay

## 2022-02-05 LAB — ALDOSTERONE + RENIN ACTIVITY W/ RATIO
ALDO / PRA Ratio: 4 (ref 0.0–30.0)
Aldosterone: 5 ng/dL (ref 0.0–30.0)
PRA LC/MS/MS: 1.237 ng/mL/hr (ref 0.167–5.380)

## 2022-02-05 NOTE — Telephone Encounter (Addendum)
Summary: Dizziness advice   Pt was discharged from the hospital on 02/02/22 For Hypertensive emergency. Pt reports that she is taking 7 medications. And she was dizzy the whole weekend and is dizzy today. Please advise     Called pt and LM on VM to call back [phone].

## 2022-02-06 ENCOUNTER — Telehealth: Payer: Self-pay

## 2022-02-06 DIAGNOSIS — R899 Unspecified abnormal finding in specimens from other organs, systems and tissues: Secondary | ICD-10-CM

## 2022-02-07 ENCOUNTER — Ambulatory Visit: Payer: Self-pay | Admitting: *Deleted

## 2022-02-07 ENCOUNTER — Encounter: Payer: Self-pay | Admitting: Physician Assistant

## 2022-02-07 ENCOUNTER — Ambulatory Visit: Payer: Self-pay | Attending: Physician Assistant | Admitting: Physician Assistant

## 2022-02-07 VITALS — BP 112/80 | HR 62 | Wt 224.8 lb

## 2022-02-07 DIAGNOSIS — I159 Secondary hypertension, unspecified: Secondary | ICD-10-CM

## 2022-02-07 DIAGNOSIS — R42 Dizziness and giddiness: Secondary | ICD-10-CM

## 2022-02-07 DIAGNOSIS — Z09 Encounter for follow-up examination after completed treatment for conditions other than malignant neoplasm: Secondary | ICD-10-CM

## 2022-02-07 DIAGNOSIS — I952 Hypotension due to drugs: Secondary | ICD-10-CM

## 2022-02-07 NOTE — Telephone Encounter (Signed)
Pt returning a call about results  

## 2022-02-07 NOTE — Telephone Encounter (Signed)
  Chief Complaint: dizziness Symptoms: dizziness with new BP medications Frequency: 01/25/22- discharged  Pertinent Negatives: Patient denies fever, chest pain, vomiting, diarrhea, bleeding Disposition: [] ED /[] Urgent Care (no appt availability in office) / [x] Appointment(In office/virtual)/ []  [facility]/ [] Home Care/ [] Refused Recommended Disposition /[] [facility]/ []  Follow-up with PCP Additional Notes:  Patient may be dizzy due to her BP/P being low- reports low Pulse readings and low BP

## 2022-02-07 NOTE — Telephone Encounter (Signed)
Scheduled appointment next week for patient  Aware of date, time, and location

## 2022-02-07 NOTE — Addendum Note (Signed)
Addended by: Regis Bill B on: 02/07/2022 04:05 PM   Modules accepted: Orders

## 2022-02-07 NOTE — Patient Instructions (Addendum)
Do not pick up amlodipine for now.  Take 1/2 clonidine 3 times daily.  Continue other medications at current dose.    Check blood pressures 1-2 times daily and record and bring to your visit monday

## 2022-02-07 NOTE — Telephone Encounter (Signed)
Reason for Disposition  [1] MODERATE dizziness (e.g., interferes with normal activities) AND [2] has NOT been evaluated by physician for this  (Exception: dizziness caused by heat exposure, sudden standing, or poor fluid intake)  Answer Assessment - Initial Assessment Questions 1. DESCRIPTION: "Describe your dizziness."     Feels like she is in a daze- changing from laying to standing  2. LIGHTHEADED: "Do you feel lightheaded?" (e.g., somewhat faint, woozy, weak upon standing)     no 3. VERTIGO: "Do you feel like either you or the room is spinning or tilting?" (i.e. vertigo)     no 4. SEVERITY: "How bad is it?"  "Do you feel like you are going to faint?" "Can you stand and walk?"   - MILD: Feels slightly dizzy, but walking normally.   - MODERATE: Feels unsteady when walking, but not falling; interferes with normal activities (e.g., school, work).   - SEVERE: Unable to walk without falling, or requires assistance to walk without falling; feels like passing out now.      moderate 5. ONSET:  "When did the dizziness begin?"     Since discharge- new start 6. AGGRAVATING FACTORS: "Does anything make it worse?" (e.g., standing, change in head position)     Changing position 7. HEART RATE: "Can you tell me your heart rate?" "How many beats in 15 seconds?"  (Note: not all patients can do this)       54, 62 8. CAUSE: "What do you think is causing the dizziness?"     BP- 123/76, 113/63- today  9. RECURRENT SYMPTOM: "Have you had dizziness before?" If Yes, ask: "When was the last time?" "What happened that time?"     no 10. OTHER SYMPTOMS: "Do you have any other symptoms?" (e.g., fever, chest pain, vomiting, diarrhea, bleeding)       no 11. PREGNANCY: "Is there any chance you are pregnant?" "When was your last menstrual period?"  Protocols used: Dizziness - Lightheadedness-A-AH

## 2022-02-07 NOTE — Telephone Encounter (Signed)
Advised patient, referral placed  

## 2022-02-07 NOTE — Progress Notes (Signed)
Patient ID: Robin Jarvis, female   DOB: June 23, 1976, 46 y.o.   MRN: [id number]   Robin Jarvis, is a 46 y.o. female  RKY:[id number]  [id number]  DOB - [birth year]  Chief Complaint  Patient presents with   Hospitalization Follow-up       Subjective:   Robin Jarvis is a 45 y.o. female here today for a follow up visit After hospitalization 6/15 to 02/02/2022 with uncontrolled htn and CHF with dilated cardiomyopathy.  She had been noncompliant with meds PTA.  All of her meds were increased in dose and she has been experiencing light-headedness and dizziness.  She did not pick up the amlodipine.  However, she is taking:   hydralazine 100mg  tid  clonidine 0.2mg  tid Chlorthalidone 25mg  daily Carvedilol 25mg  bid ImDur 30mg  daily Spironolactone 25mg  daily  So, PTA she was prescribed some of these medications, but was not taking any of them.  In the hospital, her doses were increased.  Since being home and taking the meds as directed, she is feeling lightheaded. She reports BP this morning of BP- 123/76, 113/63- and this appt was scheduled.  She said she feels ok otherwise and denies CP/SOB/HA.     From discharge summary: Admit date: 01/25/2022 Discharge date: 02/02/2022    Admitted From: Home Disposition:  Home Discharging physician: Lewie Chamber, MD   Recommendations for Outpatient Follow-up:  Follow up with HTN clinic Further BP med adjustment  Repeat echo in ~3 months Repeat BMP, Mg Hospital Course: Robin Jarvis is a 46 year old female with PMH HTN, obesity, medication noncompliance due to financial constraints, diastolic CHF who presented with chest pain and uncontrolled blood pressures.  She initially was managed in the ICU on clevidipine drip.  Cardiology was also consulted on admission. Echo performed on admission which showed EF 40%, LV global hypokinesis, severe LVH, and grade 2 diastolic dysfunction.   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 Echo this admission shows EF 40%, grade 2 diastolic dysfunction, severe LVH and global hypokinesis of the left ventricle.  Not in acute decompensation. Cardiology following. Not a candidate for ACE/ARB/ARNI due to history of "swelling" with ACEi.  - continue amlodipine, Coreg, chlorthalidone, clonidine (added 6/22), hydralazine, Imdur, spironolactone - continue farxiga; medication assistance being pursued by pharmacy    Hypertensive emergency: Resolved Hypertension - s/p cleviprex drip on admission - now transitioned to PO regimen Weaned off Cleviprex drip. - follow up renin/aldo and urine metanephrines   Chest pain: Mildly elevated high sensitive troponins now flattened.  Likely due to demand ischemia.  Chest pain resolved.  CT coronary also shows no evidence of CAD; coronary ca score 0   Chronic kidney disease stage II: Appears at baseline.  Baseline creatinine 0.9-1.1.   Morbid obesity: BMI 35+ hypertension/CHF   Medication noncompliance: secondary to no insurance.  TOC consulted. Prescriptions sent to [facility] pharmacy    No problems updated.  ALLERGIES: Allergies  Allergen Reactions   Lisinopril Swelling    Discussed with patient 01/31/2022 more information about reaction to lisinopril. Patient transitioned off ?labetalol used during pregnancy to lisinopril post-partum and developed lip swelling, which was noticed in office visit and medication was stopped. Patient never developed SOB or trouble breathing and did not need to go to the hospital. She is amendable to trying an ARB if done inpatient.      PAST MEDICAL HISTORY: Past Medical History:  Diagnosis Date   Herpes    Hypertension  Migraine     MEDICATIONS AT HOME: Prior to Admission medications   Medication Sig Start Date End Date Taking? Authorizing Provider  carvedilol (COREG) 25 MG tablet Take 1 tablet (25 mg total) by mouth 2 (two) times daily with a  meal. 02/02/22  Yes Lewie Chamber, MD  chlorthalidone (HYGROTON) 25 MG tablet Take 1 tablet (25 mg total) by mouth daily. 02/03/22  Yes Lewie Chamber, MD  cloNIDine (CATAPRES) 0.2 MG tablet Take 1 tablet (0.2 mg total) by mouth 3 (three) times daily. 02/02/22 03/04/22 Yes Lewie Chamber, MD  dapagliflozin propanediol (FARXIGA) 10 MG TABS tablet Take 1 tablet (10 mg total) by mouth daily. 02/03/22  Yes Lewie Chamber, MD  hydrALAZINE (APRESOLINE) 100 MG tablet Take 1 tablet (100 mg total) by mouth every 8 (eight) hours. 02/02/22  Yes Lewie Chamber, MD  isosorbide mononitrate (IMDUR) 30 MG 24 hr tablet Take 1 tablet (30 mg total) by mouth daily. 02/03/22  Yes Lewie Chamber, MD  Multiple Vitamin (MULTIVITAMIN WITH MINERALS) TABS tablet Take 1 tablet by mouth daily.   Yes [provider]  spironolactone (ALDACTONE) 25 MG tablet Take 1 tablet (25 mg total) by mouth daily. 02/03/22  Yes Lewie Chamber, MD  acetaminophen (TYLENOL) 500 MG tablet Take 1,000 mg by mouth every 6 (six) hours as needed for mild pain.    [provider]    ROS: Neg HEENT Neg resp Neg cardiac Neg GI Neg GU Neg MS Neg psych  Objective:   Vitals:   02/07/22 1549 02/07/22 1606  BP: (!) 151/94 112/80  Pulse: 62   SpO2: 98%   Weight: 224 lb 12.8 oz (102 kg)    Exam General appearance : Awake, alert, not in any distress. Speech Clear. Not toxic looking HEENT: Atraumatic and Normocephalic, pupils equally reactive to light and accomodation, EOM intact without nystagmus Neck: Supple, no JVD. No cervical lymphadenopathy.  Chest: Good air entry bilaterally, CTAB.  No rales/rhonchi/wheezing CVS: S1 S2 regular, no murmurs.  Extremities: B/L Lower Ext shows no edema, both legs are warm to touch Neurology: Awake alert, and oriented X 3, CN II-XII intact, Non focal Skin: No Rash  Data Review Lab Results  Component Value Date   HGBA1C 5.5 01/25/2022   HGBA1C 5.4 11/11/2019    Assessment & Plan   1.  Secondary hypertension I will have her decrease the dose of clonidine as maybe her lightheadedness is related to bringing BP down too quickly.  I have advised her to take 1/2 clonidine tid. Continue: hydralazine 100mg  tid Chlorthalidone 25mg  daily Carvedilol 25mg  bid ImDur 30mg  daily Spironolactone 25mg  daily She has obtained a BP cuff and will check BP 1-2 times daily and record and keep appt with Zelda Monday to establish care for close follow up.  Drink adequate water but avoid volume overload-patient verbalizes understanding.     2. Hospital discharge follow-up  3. Dizziness See #1.  No red flags on exam  4. Hypotension due to drugs See #1    Return for keep appt with Zelda Monday.  The patient was given clear instructions to go to ER or return to medical center if symptoms don't improve, worsen or new problems develop. The patient verbalized understanding. The patient was told to call to get lab results if they haven't heard anything in the next week.      Georgian Co, PA-C [facility] and [facility] [place], [place] [phone]   02/08/2022, 8:44 AM

## 2022-02-12 ENCOUNTER — Other Ambulatory Visit (HOSPITAL_COMMUNITY): Payer: Self-pay

## 2022-02-12 ENCOUNTER — Encounter: Payer: Self-pay | Admitting: Nurse Practitioner

## 2022-02-12 ENCOUNTER — Inpatient Hospital Stay: Payer: Self-pay | Admitting: Nurse Practitioner

## 2022-02-14 ENCOUNTER — Encounter (HOSPITAL_COMMUNITY): Payer: Self-pay

## 2022-02-14 ENCOUNTER — Telehealth (HOSPITAL_COMMUNITY): Payer: Self-pay | Admitting: *Deleted

## 2022-02-14 ENCOUNTER — Ambulatory Visit (HOSPITAL_COMMUNITY)
Admit: 2022-02-14 | Discharge: 2022-02-14 | Disposition: A | Discharge status: Home / Self Care | Payer: Self-pay | Source: Ambulatory Visit | Attending: Adult Health | Admitting: Adult Health

## 2022-02-14 ENCOUNTER — Other Ambulatory Visit: Payer: Self-pay

## 2022-02-14 VITALS — BP 170/110 | HR 64 | Wt 225.6 lb

## 2022-02-14 DIAGNOSIS — I159 Secondary hypertension, unspecified: Secondary | ICD-10-CM

## 2022-02-14 DIAGNOSIS — Z91148 Patient's other noncompliance with medication regimen for other reason: Secondary | ICD-10-CM | POA: Insufficient documentation

## 2022-02-14 DIAGNOSIS — Z7901 Long term (current) use of anticoagulants: Secondary | ICD-10-CM | POA: Insufficient documentation

## 2022-02-14 DIAGNOSIS — I509 Heart failure, unspecified: Secondary | ICD-10-CM | POA: Insufficient documentation

## 2022-02-14 DIAGNOSIS — Z7984 Long term (current) use of oral hypoglycemic drugs: Secondary | ICD-10-CM | POA: Insufficient documentation

## 2022-02-14 DIAGNOSIS — I428 Other cardiomyopathies: Secondary | ICD-10-CM | POA: Insufficient documentation

## 2022-02-14 DIAGNOSIS — I420 Dilated cardiomyopathy: Secondary | ICD-10-CM

## 2022-02-14 DIAGNOSIS — I110 Hypertensive heart disease with heart failure: Secondary | ICD-10-CM | POA: Insufficient documentation

## 2022-02-14 DIAGNOSIS — Z79899 Other long term (current) drug therapy: Secondary | ICD-10-CM | POA: Insufficient documentation

## 2022-02-14 DIAGNOSIS — I5020 Unspecified systolic (congestive) heart failure: Secondary | ICD-10-CM

## 2022-02-14 LAB — BASIC METABOLIC PANEL
Anion gap: 9 (ref 5–15)
BUN: 23 mg/dL — ABNORMAL HIGH (ref 6–20)
CO2: 31 mmol/L (ref 22–32)
Calcium: 10 mg/dL (ref 8.9–10.3)
Chloride: 96 mmol/L — ABNORMAL LOW (ref 98–111)
Creatinine, Ser: 1.51 mg/dL — ABNORMAL HIGH (ref 0.44–1.00)
GFR, Estimated: 44 mL/min — ABNORMAL LOW (ref >60)
Glucose, Bld: 103 mg/dL — ABNORMAL HIGH (ref 70–99)
Potassium: 3.6 mmol/L (ref 3.5–5.1)
Sodium: 136 mmol/L (ref 135–145)

## 2022-02-14 MED ORDER — AMLODIPINE BESYLATE 10 MG PO TABS
10.0000 mg | ORAL_TABLET | Freq: Every day | ORAL | 0 refills | Status: DC
Start: 2022-02-14 — End: 2022-03-15
  Filled 2022-02-14: qty 30, 30d supply, fill #0

## 2022-02-14 NOTE — Patient Instructions (Addendum)
START Amlodipine 10 mg Daily at Bedtime  Labs done today, your results will be available in MyChart, we will contact you for abnormal readings.  Your physician has requested that you regularly monitor and record your blood pressure readings at home. Please use the same machine at the same time of day to check your readings and record them to bring to your follow-up visit. **PLEASE BRING YOUR HOME BP CUFF/MACHINE TO APPT ON 02/15/22  Thank you for allowing [place] to provider your heart failure care after your recent hospitalization. Please follow-up with [facility] as scheduled on 02/15/22   Do the following things EVERYDAY: Weigh yourself in the morning before breakfast. Write it down and keep it in a log. Take your medicines as prescribed Eat low salt foods--Limit salt (sodium) to 2000 mg per day.  Stay as active as you can everyday Limit all fluids for the day to less than 2 liters

## 2022-02-14 NOTE — Progress Notes (Signed)
HEART & VASCULAR TRANSITION OF CARE CONSULT NOTE     Referring Physician: Dr Radford Pax Primary Care: Freeman Caldron PA Primary Cardiologist: Dr Oval Linsey  HPI: Referred to clinic by Dr Radford Pax for heart failure consultation.   Robin Jarvis is a 46 year old with a history of HTN,  developed HTN during pregnancy, medication non compliance, NICM, and newly reduced EF from 50-55%-->40%.   Admitted 01/25/22 with chest pain and hypertensive emergency. PTA she was not taking any medications. Placed on and later transitioned to oral medications with Echo completed EF down to 40% from previous 50-55% in 2021. [place] CT negative for for CAD and calcium score was 0. Discharged 02/02/22. Given MATCH for all medications but was not given amlodipine.    Overall feeling fine. She has had some episodes of being light headed when standing. She notices it more in the morning. Denies SOB/PND/Orthopnea. Able to walk up steps. Her apartment is on the  second floor.  Appetite ok. No fever or chills. Weight at home 223-224  pounds. Taking all medications. She has been taking her blood pressure daily at home. SBP at home 117-146. Todays reading at home was 117/62.    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 Currently out of work due to recent hospitalization. Lives alone. She is uninsured. She canceled medical insurance at work due to cost. She has difficulty paying for medications. She has 3 grown children.    Cardiac Testing  01/2022 Echo EF 40% Grade II DD. 10/2019 Echo EF 50-55%. LVH   Review of Systems: [y] = yes, [ ]  = no   General: Weight gain [ ] ; Weight loss [ ] ; Anorexia [ ] ; Fatigue [ ] ; Fever [ ] ; Chills [ ] ; Weakness [ ]   Cardiac: Chest pain/pressure [ ] ; Resting SOB [ ] ; Exertional SOB [ ] ; Orthopnea [ ] ; Pedal Edema [ ] ; Palpitations [ ] ; Syncope [ ] ; Presyncope [ ] ; Paroxysmal nocturnal dyspnea[ ]   Pulmonary: Cough [ ] ; Wheezing[ ] ; Hemoptysis[ ] ; Sputum [ ] ; Snoring [ ]   GI: Vomiting[ ] ; Dysphagia[  ]; Melena[ ] ; Hematochezia [ ] ; Heartburn[ ] ; Abdominal pain [ ] ; Constipation [ ] ; Diarrhea [ ] ; BRBPR [ ]   GU: Hematuria[ ] ; Dysuria [ ] ; Nocturia[ ]   Vascular: Pain in legs with walking [ ] ; Pain in feet with lying flat [ ] ; Non-healing sores [ ] ; Stroke [ ] ; TIA [ ] ; Slurred speech [ ] ;  Neuro: Headaches[ ] ; Vertigo[ ] ; Seizures[ ] ; Paresthesias[ ] ;Blurred vision [ ] ; Diplopia [ ] ; Vision changes [ ]   Ortho/Skin: Arthritis [ ] ; Joint pain [ Y]; Muscle pain [ ] ; Joint swelling [ ] ; Back Pain [ ] ; Rash [ ]   Psych: Depression[ ] ; Anxiety[ Y]  Heme: Bleeding problems [ ] ; Clotting disorders [ ] ; Anemia [ ]   Endocrine: Diabetes [ ] ; Thyroid dysfunction[ ]    Past Medical History:  Diagnosis Date   Herpes    Hypertension    Migraine     Current Outpatient Medications  Medication Sig Dispense Refill   acetaminophen (TYLENOL) 500 MG tablet Take 1,000 mg by mouth every 6 (six) hours as needed for mild pain.     carvedilol (COREG) 25 MG tablet Take 1 tablet (25 mg total) by mouth 2 (two) times daily with a meal. 60 tablet 3   chlorthalidone (HYGROTON) 25 MG tablet Take 1 tablet (25 mg total) by mouth daily. 30 tablet 3   cloNIDine (CATAPRES) 0.2 MG tablet Take 0.1 mg by mouth in  the morning, at noon, and at bedtime.     dapagliflozin propanediol (FARXIGA) 10 MG TABS tablet Take 1 tablet (10 mg total) by mouth daily. 30 tablet 3   hydrALAZINE (APRESOLINE) 100 MG tablet Take 1 tablet (100 mg total) by mouth every 8 (eight) hours. 90 tablet 3   isosorbide mononitrate (IMDUR) 30 MG 24 hr tablet Take 1 tablet (30 mg total) by mouth daily. 30 tablet 3   Multiple Vitamin (MULTIVITAMIN WITH MINERALS) TABS tablet Take 1 tablet by mouth daily.     spironolactone (ALDACTONE) 25 MG tablet Take 1 tablet (25 mg total) by mouth daily. 30 tablet 3   No current facility-administered medications for this encounter.    Allergies  Allergen Reactions   Lisinopril Swelling    Discussed with patient 01/31/2022  more information about reaction to lisinopril. Patient transitioned off ?labetalol used during pregnancy to lisinopril post-partum and developed lip swelling, which was noticed in office visit and medication was stopped. Patient never developed SOB or trouble breathing and did not need to go to the hospital. She is amendable to trying an ARB if done inpatient.        Social History   Socioeconomic History   Marital status: Single    Spouse name: Not on file   Number of children: 3   Years of education: Not on file   Highest education level: High school graduate  Occupational History   Occupation: [employer]    Comment: Full time  Tobacco Use   Smoking status: Never   Smokeless tobacco: Never  Vaping Use   Vaping Use: Never used  Substance and Sexual Activity   Alcohol use: Yes    Alcohol/week: 4.0 standard drinks of alcohol    Types: 4 Glasses of wine per week    Comment: occ   Drug use: No   Sexual activity: Yes  Other Topics Concern   Not on file  Social History Narrative   Not on file   Social Determinants of Health   Financial Resource Strain: Low Risk  (01/30/2022)   Overall Financial Resource Strain (CARDIA)    Difficulty of Paying Living Expenses: Not very hard  Food Insecurity: No Food Insecurity (01/30/2022)   Hunger Vital Sign    Worried About Running Out of Food in the Last Year: Never true    Ran Out of Food in the Last Year: Never true  Transportation Needs: No Transportation Needs (01/30/2022)   PRAPARE - Administrator, Civil Service (Medical): No    Lack of Transportation (Non-Medical): No  Physical Activity: Not on file  Stress: Not on file  Social Connections: Not on file  Intimate Partner Violence: Not on file      Family History  Problem Relation Age of Onset   HIV/AIDS Mother    HIV/AIDS Father    Hypertension Maternal Aunt    Hyperlipidemia Maternal Aunt    Diabetes Maternal Aunt     Vitals:   02/14/22 0844  BP: (!)  170/110  Pulse: 64  SpO2: 98%  Weight: 102.3 kg (225 lb 9.6 oz)   Wt Readings from Last 3 Encounters:  02/14/22 102.3 kg (225 lb 9.6 oz)  02/07/22 102 kg (224 lb 12.8 oz)  02/02/22 103.4 kg (227 lb 14.4 oz)   BP verified x2.   PHYSICAL EXAM: General:  Walked in the clinic.  No respiratory difficulty HEENT: normal Neck: supple. no JVD. Carotids 2+ bilat; no bruits. No lymphadenopathy or thryomegaly appreciated.  Cor: PMI nondisplaced. Regular rate & rhythm. No rubs, gallops or murmurs. Lungs: clear Abdomen: soft, nontender, nondistended. No hepatosplenomegaly. No bruits or masses. Good bowel sounds. Extremities: no cyanosis, clubbing, rash, edema Neuro: alert & oriented x 3, cranial nerves grossly intact. moves all 4 extremities w/o difficulty. Affect pleasant.  ECG: SR 65 BPM    ASSESSMENT & PLAN: 1. HFrEF  Echo Ef in 2021 50-55%. Recent hospitalization EF down 40%, RV normal  with grade II DD.  Coronary CT negative for CAD. Suspect in the setting hypertension.  NYHA II.  GDMT  Diuretic-Volume status stable.  BB-On carvedilol 25 mg twice a day  Ace/ARB/ARNI- No. H/O angioedema with lisinopril. Not a candidate for entresto.  MRA- Continue spiro 25 mg daily  SGLT2i- Continue farxiga 10 mg daily  - Continue hydralazine 100 mg three times a day - Continue imdur 30 mg daily  -Check BMET  Repeat ECHO in 3 months.   2. HTN  - Elevated.  -On multiple medications except she was not given amlodipine. Will add amlodipine 10 mg daily. Script sent in.  Discussed medication changes.  - Elevated today. Home blood pressure has been stable. Asked her to continue daily BP monitoring and to bring. She has follow up tomorrow with [facility] and I have asked her to bring BP machine to her appointment to compare results.   - 11/2019 Renal US - no evidence of renal artery stenosis.  - PRA 1.23. Aldo/PRA ratio 4 Aldosterone 5  3. Social  -Uninsured.  -Concerned about medication adherance. Will need  to make she can obtain medications.  -Refer to SW for assistance with insurance.     Referred to HFSW (PCP, Medications, Transportation, ETOH Abuse, Drug Abuse, Insurance, Financial ): Yes or No Refer to Pharmacy: Yes or No Refer to Home Health:  No Refer to Advanced Heart Failure Clinic: Yes or no  Refer to General Cardiology: She will follow up with Dr Duke Salvia  Follow up with HTN Clinic tomorrow.   Robin Yoshida NP-C  9:27 AM

## 2022-02-14 NOTE — Progress Notes (Signed)
Heart and Vascular Care Navigation  02/14/2022  Robin Jarvis [birth year] [id number]  Reason for Referral: Patient seen in HF TOC.   Engaged with patient face to face for initial visit for Heart and Vascular Care Coordination.                                                                                                   Assessment:  Patient is a 46 yo female who lives  alone in an apartment. Patient works full time although has been out of work since hospitalization in mid June. Patient is trying to obtain her short term disability benefits although has been unable to print from her email.  Patient states she dropped her health insurance benefits due to cost and is now a self pay. She is hopeful to get insurance but will have to wait for open enrollment. Patient denies need with transportation, housing or food.                                  HRT/VAS Care Coordination     Patients Home Cardiology Office Heart Failure Clinic  [facility]   Outpatient Care Team Social Worker   Social Worker Name: Lasandra Beech, [place] [phone]   Living arrangements for the past 2 months Apartment   Lives with: Self   Patient Current Insurance Coverage Self-Pay   Patient Has Concern With Paying Medical Bills Yes   Medical Bill Referrals: Financial Counseling   Does Patient Have Prescription Coverage? No   Patient Prescription Assistance Programs [facility] [facility]   [place] [facility] Medications Application provided   Home Assistive Devices/Equipment None       Social History:                                                                             SDOH Screenings   Alcohol Screen: Low Risk  (01/30/2022)   Alcohol Screen    Last Alcohol Screening Score (AUDIT): 0  Depression (PHQ2-9): Medium Risk (02/07/2022)   Depression (PHQ2-9)    PHQ-2 Score: 15  Financial Resource Strain: High Risk (02/14/2022)   Overall Financial Resource Strain (CARDIA)    Difficulty of Paying Living Expenses: Hard  Food Insecurity:  No Food Insecurity (01/30/2022)   Hunger Vital Sign    Worried About Running Out of Food in the Last Year: Never true    Ran Out of Food in the Last Year: Never true  Housing: Low Risk  (01/30/2022)   Housing    Last Housing Risk Score: 0  Physical Activity: Not on file  Social Connections: Not on file  Stress: Not on file  Tobacco Use: Low Risk  (02/14/2022)   Patient History    Smoking Tobacco Use: Never  Smokeless Tobacco Use: Never    Passive Exposure: Not on file  Transportation Needs: No Transportation Needs (01/30/2022)   PRAPARE - Transportation    Lack of Transportation (Medical): No    Lack of Transportation (Non-Medical): No    SDOH Interventions: Financial Resources:  Corporate treasurer Interventions: Child psychotherapist for [employer] and [place] [facility] applications  Food Insecurity:   N/a  Housing Insecurity:   N/a  Transportation:    N/a   Follow-up plan:  CSW provided patient with [place] [facility] and the CAFA applications to complete and return with required documentation. Patient states she is able to afford current medications at [facility] and she has been seen by PCP at [facility]. CSW printed off her short term disability application to complete and submit to her employer. Patient will follow up with applications and ask for help if needed. Patient has appointment with HTN Clinic tomorrow and will follow up as needed. CSW available and will contact [name], LCSW to inform of patient's needs and possible follow up. Lasandra Beech, LCSW, CCSW-MCS [phone]

## 2022-02-14 NOTE — Telephone Encounter (Signed)
Call attempted to confirm HV TOC appt 9 am on 02/14/22. HIPPA appropriate VM left with callback number.    Rhae Hammock, BSN, Scientist, clinical (histocompatibility and immunogenetics) Only

## 2022-02-15 ENCOUNTER — Ambulatory Visit (INDEPENDENT_AMBULATORY_CARE_PROVIDER_SITE_OTHER): Payer: Self-pay | Admitting: Family

## 2022-02-15 ENCOUNTER — Telehealth (HOSPITAL_COMMUNITY): Payer: Self-pay

## 2022-02-15 ENCOUNTER — Encounter (HOSPITAL_BASED_OUTPATIENT_CLINIC_OR_DEPARTMENT_OTHER): Payer: Self-pay | Admitting: Family

## 2022-02-15 VITALS — BP 142/91 | HR 60 | Ht 69.0 in | Wt 227.0 lb

## 2022-02-15 DIAGNOSIS — I5022 Chronic systolic (congestive) heart failure: Secondary | ICD-10-CM

## 2022-02-15 DIAGNOSIS — I110 Hypertensive heart disease with heart failure: Secondary | ICD-10-CM

## 2022-02-15 DIAGNOSIS — I10 Essential (primary) hypertension: Secondary | ICD-10-CM

## 2022-02-15 NOTE — Telephone Encounter (Signed)
Patient assistance application for Robin Jarvis started. Patient portion completed and signed. Sent to [facility] for provider completion and submission.

## 2022-02-15 NOTE — Progress Notes (Signed)
Advanced Hypertension Clinic Initial Assessment:    Date:  02/17/2022   ID:  Robin Jarvis, DOB [birth year], MRN [id number]  PCP:  System, Provider Not In  Cardiologist:  Armanda Magic, MD  Nephrologist:  Referring MD: No ref. provider found   CC: Hypertension  History of Present Illness:    Robin Jarvis is a 46 y.o. female with a hx of hypertensive heart disease with heart failure, NICM here to establish care in the Advanced Hypertension Clinic.   She was admitted 01/2022 with chest pain and hypertensive emergency.  PTA not taking any medications.  Echo with EF 40% (previous 50-55% in 2021).  Coronary CTA with coronary calcium score of 0.  She was evaluated yesterday by the heart and vascular TOC clinic and amlodipine was started.  She was provided not to program due to lack of insurance while admitted and is being followed by social work team.  Plans to enroll during open enrollment.  In the fall.  She presents today to establish with the advanced hypertension clinic. Robin Jarvis was diagnosed with hypertension 20 years ago.  Initially diagnosed during pregnancy then resolved post partum after her first child however after her final delivery, 19 years ago, hypertension persisted.  Blood pressure checked with arm cuff at home.  It is found to read about 20 points high in clinic today.  No prior or current tobacco use.  No prior alcohol use.  No formal exercise routine.  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  Often standing for 8 to 10-hour shifts.  She is hopeful to return to work soon.  Reports no shortness of breath at rest and stable mild dyspnea on exertion. Reports no chest pain, pressure, or tightness. No edema, orthopnea, PND. Reports no palpitations.  Does note that she snores.  BP cuff accuracy check Our BP : 142/95, 142/91 Home BP cuff: 167/104  Previous antihypertensives: None  Secondary Causes of Hypertension  Medications/Herbal:  OCP, steroids, stimulants, antidepressants, weight loss medication, immune suppressants, NSAIDs, sympathomimetics, alcohol, caffeine, licorice, ginseng, St. John's wort, chemo  Sleep Apnea Renal artery stenosis Hyperaldosteronism Hyper/hypothyroidism Pheochromocytoma: palpitations, tachycardia, headache, diaphoresis (plasma metanephrines) Cushing's syndrome: Cushingoid facies, central obesity, proximal muscle weakness, and ecchymoses, adrenal incidentaloma (cortisol) Coarctation of the aorta  Past Medical History:  Diagnosis Date   Herpes    Hypertension    Migraine     Past Surgical History:  Procedure Laterality Date   CESAREAN SECTION     TUBAL LIGATION      Current Medications: Current Meds  Medication Sig   acetaminophen (TYLENOL) 500 MG tablet Take 1,000 mg by mouth every 6 (six) hours as needed for mild pain.   amLODipine (NORVASC) 10 MG tablet Take 1 tablet (10 mg total) by mouth at bedtime.   carvedilol (COREG) 25 MG tablet Take 1 tablet (25 mg total) by mouth 2 (two) times daily with a meal.   chlorthalidone (HYGROTON) 25 MG tablet Take 1 tablet (25 mg total) by mouth daily.   cloNIDine (CATAPRES) 0.2 MG tablet Take 0.1 mg by mouth in the morning, at noon, and at bedtime.   dapagliflozin propanediol (FARXIGA) 10 MG TABS tablet Take 1 tablet (10 mg total) by mouth daily.   hydrALAZINE (APRESOLINE) 100 MG tablet Take 1 tablet (100 mg total) by mouth every 8 (eight) hours.   isosorbide mononitrate (IMDUR) 30 MG 24 hr tablet Take 1 tablet (30 mg total) by mouth daily.  Multiple Vitamin (MULTIVITAMIN WITH MINERALS) TABS tablet Take 1 tablet by mouth daily.   spironolactone (ALDACTONE) 25 MG tablet Take 1 tablet (25 mg total) by mouth daily.     Allergies:   Lisinopril   Social History   Socioeconomic History   Marital status: Single    Spouse name: Not on file   Number of children: 3   Years of education: Not on file   Highest education level: High school graduate   Occupational History   Occupation: [employer]    Comment: Full time  Tobacco Use   Smoking status: Never   Smokeless tobacco: Never  Vaping Use   Vaping Use: Never used  Substance and Sexual Activity   Alcohol use: Yes    Alcohol/week: 4.0 standard drinks of alcohol    Types: 4 Glasses of wine per week    Comment: occ   Drug use: No   Sexual activity: Yes  Other Topics Concern   Not on file  Social History Narrative   Not on file   Social Determinants of Health   Financial Resource Strain: High Risk (02/14/2022)   Overall Financial Resource Strain (CARDIA)    Difficulty of Paying Living Expenses: Hard  Food Insecurity: No Food Insecurity (01/30/2022)   Hunger Vital Sign    Worried About Running Out of Food in the Last Year: Never true    Ran Out of Food in the Last Year: Never true  Transportation Needs: No Transportation Needs (01/30/2022)   PRAPARE - Administrator, Civil Service (Medical): No    Lack of Transportation (Non-Medical): No  Physical Activity: Not on file  Stress: Not on file  Social Connections: Socially Isolated (02/15/2022)   Social Connection and Isolation Panel [NHANES]    Frequency of Communication with Friends and Family: More than three times a week    Frequency of Social Gatherings with Friends and Family: Never    Attends Religious Services: Never    Database administrator or Organizations: No    Attends Engineer, structural: Never    Marital Status: Never married     Family History: The patient's family history includes Diabetes in her maternal aunt; HIV/AIDS in her father and mother; Hyperlipidemia in her maternal aunt; Hypertension in her maternal aunt.  ROS:   Please see the history of present illness.    All other systems reviewed and are negative.  EKGs/Labs/Other Studies Reviewed:    EKG:  No EKG today.   Recent Labs: 01/25/2022: B Natriuretic Peptide 1,003.2 01/29/2022: Hemoglobin 12.5; Platelets  242 02/02/2022: Magnesium 2.0 02/14/2022: BUN 23; Creatinine, Ser 1.51; Potassium 3.6; Sodium 136   Recent Lipid Panel    Component Value Date/Time   CHOL 151 01/25/2022 1814   TRIG 75 01/25/2022 1814   HDL 36 (L) 01/25/2022 1814   CHOLHDL 4.2 01/25/2022 1814   VLDL 15 01/25/2022 1814   LDLCALC 100 (H) 01/25/2022 1814    Physical Exam:   VS:  BP (!) 142/91 Comment: right arm  Pulse 60   Ht 5\' 9"  (1.753 m)   Wt 227 lb (103 kg)   LMP 12/22/2021   BMI 33.52 kg/m  , BMI Body mass index is 33.52 kg/m. GENERAL:  Well appearing HEENT: Pupils equal round and reactive, fundi not visualized, oral mucosa unremarkable NECK:  No jugular venous distention, waveform within normal limits, carotid upstroke brisk and symmetric, no bruits, no thyromegaly LYMPHATICS:  No cervical adenopathy LUNGS:  Clear to auscultation  bilaterally HEART:  RRR.  PMI not displaced or sustained,S1 and S2 within normal limits, no S3, no S4, no clicks, no rubs,  murmurs ABD:  Flat, positive bowel sounds normal in frequency in pitch, no bruits, no rebound, no guarding, no midline pulsatile mass, no hepatomegaly, no splenomegaly EXT:  2 plus pulses throughout, no edema, no cyanosis no clubbing SKIN:  No rashes no nodules NEURO:  Cranial nerves II through XII grossly intact, motor grossly intact throughout PSYCH:  Cognitively intact, oriented to person place and time   ASSESSMENT/PLAN:    Hypertensive heart disease with heart failure-recent admission with EF 40%.  No ACE/ARB/Arni due to previous angioedema on lisinopril.   Present antihypertensive regimen includes amlodipine 10 mg daily, carvedilol 25 mg twice daily, chlorthalidone 25 mg daily, clonidine 0.1 mg 3 times daily, hydralazine 100 mg 3 times daily, Imdur 30 mg daily, spironolactone 25 mg daily.   As she just started amlodipine yesterday will defer changes today.  Check in via MyChart for BP log in 1 week.  She understands the need for medications but would  appreciate if they could be consolidated.  Could consider increasing Imdur and reducing clonidine or transition from clonidine tablet to clonidine patch.   Additional GDMT for heart failure includes [place].  Patient assistance completed in clinic.  Low sodium diet, fluid restriction <2L, and daily weights encouraged. Educated to contact our office for weight gain of 2 lbs overnight or 5 lbs in one week.  Secondary hypertension assessment 24 hour normetanephrine/metanephrine borderline elevated during admission (normetanephrine 24 h 854)- referred to endocrinology, Dr. Chalmers Cater, by Dr. Radford Pax.  10/2019 TSH 2.975. Consider updating with next labs. Snores but defer sleep study until insurance obtained.  01/2022 renal artery duplex no stenosis. Renin/PRA ration normal.   Snore -plan to pursue sleep study when she has insurance.  Plans to enroll during approval in the fall.  Screening for Secondary Hypertension:     02/17/2022    4:54 PM  Causes  Drugs/Herbals Screened  Renovascular HTN Screened  Sleep Apnea Not Screened     - Comments sleep study once insurance obtained  Hyperaldosteronism Screened  Compliance Screened    Relevant Labs/Studies:    Latest Ref Rng & Units 02/14/2022    9:34 AM 02/02/2022    1:39 AM 02/01/2022    1:18 AM  Basic Labs  Sodium 135 - 145 mmol/L 136  134  135   Potassium 3.5 - 5.1 mmol/L 3.6  3.6  3.7   Creatinine 0.44 - 1.00 mg/dL 1.51  1.47  1.33        Latest Ref Rng & Units 11/11/2019    8:08 AM 11/10/2019    1:14 PM  Thyroid   TSH 0.350 - 4.500 uIU/mL 2.975  1.709        Latest Ref Rng & Units 01/25/2022    6:14 PM 11/14/2019    7:41 AM 11/11/2019    7:46 AM  Renin/Aldosterone   Aldosterone 0.0 - 30.0 ng/dL 5.0  19.0  16.0              01/30/2022    4:09 PM  Renovascular   Renal Artery [place] Completed Yes        she consents to be monitored in our remote patient monitoring program through [facility].  she will track his blood pressure twice daily and  understands that these trends will help [place] to adjust her medications as needed prior to his next appointment.  she is  not interested in enrolling in the PREP exercise and nutrition program through the [facility].  Agreeable to consider for the future.    Disposition:    FU with PharmD in 2 mos and Dr. Oval Linsey 4 mos.   Medication Adjustments/Labs and Tests Ordered: Current medicines are reviewed at length with the patient today.  Concerns regarding medicines are outlined above.  Orders Placed This Encounter  Procedures   Cantril's Ladder Assessment   No orders of the defined types were placed in this encounter.    Signed, Loel Dubonnet, NP  02/17/2022 4:55 PM    [facility]

## 2022-02-15 NOTE — Patient Instructions (Addendum)
Medication Instructions:  Your Physician recommend you continue on your current medication as directed.     Labwork: None ordered today    Testing/Procedures: None ordered today    Follow-Up: August 8th at 10:30am with PharmD at [place] Instructions:  Robin Jarvis stands for Dietary Approaches to Stop Hypertension. The DASH eating plan is a healthy eating plan that has been shown to: Reduce high blood pressure (hypertension). Reduce your risk for type 2 diabetes, heart disease, and stroke. Help with weight loss. What are tips for following this plan? Reading food labels Check food labels for the amount of salt (sodium) per serving. Choose foods with less than 5 percent of the Daily Value of sodium. Generally, foods with less than 300 milligrams (mg) of sodium per serving fit into this eating plan. To find whole grains, look for the word "whole" as the first word in the ingredient list. Shopping Buy products labeled as "low-sodium" or "no salt added." Buy fresh foods. Avoid canned foods and pre-made or frozen meals. Cooking Avoid adding salt when cooking. Use salt-free seasonings or herbs instead of table salt or sea salt. Check with your health care provider or pharmacist before using salt substitutes. Do not fry foods. Cook foods using healthy methods such as baking, boiling, grilling, roasting, and broiling instead. Cook with heart-healthy oils, such as olive, canola, avocado, soybean, or sunflower oil. Meal planning  Eat a balanced diet that includes: 4 or more servings of fruits and 4 or more servings of vegetables each day. Try to fill one-half of your plate with fruits and vegetables. 6-8 servings of whole grains each day. Less than 6 oz (170 g) of lean meat, poultry, or fish each day. A 3-oz (85-g) serving of meat is about the same size as a deck of cards. One egg equals 1 oz (28 g). 2-3 servings of low-fat dairy each day. One serving is 1 cup  (237 mL). 1 serving of nuts, seeds, or beans 5 times each week. 2-3 servings of heart-healthy fats. Healthy fats called omega-3 fatty acids are found in foods such as walnuts, flaxseeds, fortified milks, and eggs. These fats are also found in cold-water fish, such as sardines, salmon, and mackerel. Limit how much you eat of: Canned or prepackaged foods. Food that is high in trans fat, such as some fried foods. Food that is high in saturated fat, such as fatty meat. Desserts and other sweets, sugary drinks, and other foods with added sugar. Full-fat dairy products. Do not salt foods before eating. Do not eat more than 4 egg yolks a week. Try to eat at least 2 vegetarian meals a week. Eat more home-cooked food and less restaurant, buffet, and fast food. Lifestyle When eating at a restaurant, ask that your food be prepared with less salt or no salt, if possible. If you drink alcohol: Limit how much you use to: 0-1 drink a day for women who are not pregnant. 0-2 drinks a day for men. Be aware of how much alcohol is in your drink. In the [place] one drink equals one 12 oz bottle of beer (355 mL), one 5 oz glass of wine (148 mL), or one 1 oz glass of hard liquor (44 mL). General information Avoid eating more than 2,300 mg of salt a day. If you have hypertension, you may need to reduce your sodium intake to 1,500 mg a day. Work with your health care provider to maintain a healthy body weight or to  lose weight. Ask what an ideal weight is for you. Get at least 30 minutes of exercise that causes your heart to beat faster (aerobic exercise) most days of the week. Activities may include walking, swimming, or biking. Work with your health care provider or dietitian to adjust your eating plan to your individual calorie needs. What foods should I eat? Fruits All fresh, dried, or frozen fruit. Canned fruit in natural juice (without added sugar). Vegetables Fresh or frozen vegetables (raw, steamed,  roasted, or grilled). Low-sodium or reduced-sodium tomato and vegetable juice. Low-sodium or reduced-sodium tomato sauce and tomato paste. Low-sodium or reduced-sodium canned vegetables. Grains Whole-grain or whole-wheat bread. Whole-grain or whole-wheat pasta. Brown rice. Robin Jarvis. Bulgur. Whole-grain and low-sodium cereals. Pita bread. Low-fat, low-sodium crackers. Whole-wheat flour tortillas. Meats and other proteins Skinless chicken or [place]. Ground chicken or [place]. Pork with fat trimmed off. Fish and seafood. Egg whites. Dried beans, peas, or lentils. Unsalted nuts, nut butters, and seeds. Unsalted canned beans. Lean cuts of beef with fat trimmed off. Low-sodium, lean precooked or cured meat, such as sausages or meat loaves. Dairy Low-fat (1%) or fat-free (skim) milk. Reduced-fat, low-fat, or fat-free cheeses. Nonfat, low-sodium ricotta or cottage cheese. Low-fat or nonfat yogurt. Low-fat, low-sodium cheese. Fats and oils Soft margarine without trans fats. Vegetable oil. Reduced-fat, low-fat, or light mayonnaise and salad dressings (reduced-sodium). Canola, safflower, olive, avocado, soybean, and sunflower oils. Avocado. Seasonings and condiments Herbs. Spices. Seasoning mixes without salt. Other foods Unsalted popcorn and pretzels. Fat-free sweets. The items listed above may not be a complete list of foods and beverages you can eat. Contact a dietitian for more information. What foods should I avoid? Fruits Canned fruit in a light or heavy syrup. Fried fruit. Fruit in cream or butter sauce. Vegetables Creamed or fried vegetables. Vegetables in a cheese sauce. Regular canned vegetables (not low-sodium or reduced-sodium). Regular canned tomato sauce and paste (not low-sodium or reduced-sodium). Regular tomato and vegetable juice (not low-sodium or reduced-sodium). Robin Jarvis. Olives. Grains Baked goods made with fat, such as croissants, muffins, or some breads. Dry pasta or rice meal  packs. Meats and other proteins Fatty cuts of meat. Ribs. Fried meat. Robin Jarvis. Bologna, salami, and other precooked or cured meats, such as sausages or meat loaves. Fat from the back of a pig (fatback). Bratwurst. Salted nuts and seeds. Canned beans with added salt. Canned or smoked fish. Whole eggs or egg yolks. Chicken or [place] with skin. Dairy Whole or 2% milk, cream, and half-and-half. Whole or full-fat cream cheese. Whole-fat or sweetened yogurt. Full-fat cheese. Nondairy creamers. Whipped toppings. Processed cheese and cheese spreads. Fats and oils Butter. Stick margarine. Lard. Shortening. Ghee. Bacon fat. Tropical oils, such as coconut, palm kernel, or palm oil. Seasonings and condiments Onion salt, garlic salt, seasoned salt, table salt, and sea salt. Worcestershire sauce. Tartar sauce. Barbecue sauce. Teriyaki sauce. Soy sauce, including reduced-sodium. Steak sauce. Canned and packaged gravies. Fish sauce. Oyster sauce. Cocktail sauce. Store-bought horseradish. Ketchup. Mustard. Meat flavorings and tenderizers. Bouillon cubes. Hot sauces. Pre-made or packaged marinades. Pre-made or packaged taco seasonings. Relishes. Regular salad dressings. Other foods Salted popcorn and pretzels. The items listed above may not be a complete list of foods and beverages you should avoid. Contact a dietitian for more information. Where to find more information National Heart, Lung, and Blood Institute: PopSteam.is American Heart Association: www.heart.org Academy of Nutrition and Dietetics: www.eatright.org National Kidney Foundation: www.kidney.org Summary The DASH eating plan is a healthy eating plan that has been  shown to reduce high blood pressure (hypertension). It may also reduce your risk for type 2 diabetes, heart disease, and stroke. When on the DASH eating plan, aim to eat more fresh fruits and vegetables, whole grains, lean proteins, low-fat dairy, and heart-healthy fats. With the DASH  eating plan, you should limit salt (sodium) intake to 2,300 mg a day. If you have hypertension, you may need to reduce your sodium intake to 1,500 mg a day. Work with your health care provider or dietitian to adjust your eating plan to your individual calorie needs. This information is not intended to replace advice given to you by your health care provider. Make sure you discuss any questions you have with your health care provider. Document Revised: 07/03/2019 Document Reviewed: 07/03/2019 Elsevier Patient Education  2023 [employer].

## 2022-02-16 ENCOUNTER — Telehealth: Payer: Self-pay | Admitting: Cardiology

## 2022-02-16 DIAGNOSIS — R899 Unspecified abnormal finding in specimens from other organs, systems and tissues: Secondary | ICD-10-CM

## 2022-02-16 DIAGNOSIS — I110 Hypertensive heart disease with heart failure: Secondary | ICD-10-CM

## 2022-02-16 NOTE — Telephone Encounter (Signed)
Dr. Willeen Cass office is requesting insurance information be sent over for the referral sent for this patient.

## 2022-02-16 NOTE — Telephone Encounter (Signed)
Robin Jarvis, [facility]      02/06/22  3:57 PM Note Left a voice message for patient to give office a call back for results.   ----- Message from Azalee Course, [place] sent at 02/06/2022  2:34 PM EDT ----- Discussed with Dr. Mayford Knife, patient's 24 hour normetanephrine and metanephrine has been borderline elevated, Dr. Mayford Knife  the patient to be referred to Dr. Talmage Nap of Endocrinology

## 2022-02-16 NOTE — Telephone Encounter (Signed)
Spoke with Dr. Willeen Cass office and they will need information faxed to [phone]. Information has been sent.

## 2022-02-19 ENCOUNTER — Telehealth: Payer: Self-pay | Admitting: Cardiology

## 2022-02-19 NOTE — Telephone Encounter (Signed)
Called stating that pt was referred to their office but did not receive a copy of pt's insurance card. Please advise

## 2022-02-19 NOTE — Telephone Encounter (Signed)
Spoke with representative from [facility]. Advised her that the patient does not have insurance. She states that she is unsure whether Dr. Talmage Nap is taking self-pay patients. She is going to check on this and have someone call me back.

## 2022-02-20 ENCOUNTER — Encounter (HOSPITAL_BASED_OUTPATIENT_CLINIC_OR_DEPARTMENT_OTHER): Payer: Self-pay

## 2022-02-23 ENCOUNTER — Telehealth: Payer: Self-pay | Admitting: Licensed Clinical Social Worker

## 2022-02-23 NOTE — Telephone Encounter (Signed)
H&V Care Navigation CSW Progress Note  Clinical Social Worker contacted patient by phone to f/u on applications provided by Annice Pih, LCSW, during TOC appt. Pt reached at [phone], no current questions or concerns at this time. She shares that she feels comfortable completing applications- she has my name and number as needed if further assistance desired moving forward.   Patient is participating in a Managed Medicaid Plan:  No, self pay only.   SDOH Screenings   Alcohol Screen: Low Risk  (01/30/2022)   Alcohol Screen    Last Alcohol Screening Score (AUDIT): 0  Depression (PHQ2-9): Medium Risk (02/07/2022)   Depression (PHQ2-9)    PHQ-2 Score: 15  Financial Resource Strain: High Risk (02/14/2022)   Overall Financial Resource Strain (CARDIA)    Difficulty of Paying Living Expenses: Hard  Food Insecurity: No Food Insecurity (01/30/2022)   Hunger Vital Sign    Worried About Running Out of Food in the Last Year: Never true    Ran Out of Food in the Last Year: Never true  Housing: Low Risk  (01/30/2022)   Housing    Last Housing Risk Score: 0  Physical Activity: Not on file  Social Connections: Socially Isolated (02/15/2022)   Social Connection and Isolation Panel [NHANES]    Frequency of Communication with Friends and Family: More than three times a week    Frequency of Social Gatherings with Friends and Family: Never    Attends Religious Services: Never    Database administrator or Organizations: No    Attends Banker Meetings: Never    Marital Status: Never married  Stress: Not on file  Tobacco Use: Low Risk  (02/15/2022)   Patient History    Smoking Tobacco Use: Never    Smokeless Tobacco Use: Never    Passive Exposure: Not on file  Transportation Needs: No Transportation Needs (01/30/2022)   PRAPARE - Transportation    Lack of Transportation (Medical): No    Lack of Transportation (Non-Medical): No    [name], MSW, LCSW Clinical Social Worker [facility]  Heart/Vascular Care Navigation  [phone]- work cell phone (preferred) [phone]- desk phone

## 2022-02-26 ENCOUNTER — Encounter (HOSPITAL_BASED_OUTPATIENT_CLINIC_OR_DEPARTMENT_OTHER): Payer: Self-pay | Admitting: Family

## 2022-02-26 ENCOUNTER — Encounter (HOSPITAL_BASED_OUTPATIENT_CLINIC_OR_DEPARTMENT_OTHER): Payer: Self-pay

## 2022-02-26 DIAGNOSIS — I110 Hypertensive heart disease with heart failure: Secondary | ICD-10-CM

## 2022-02-26 NOTE — Telephone Encounter (Signed)
Ok to write a return to work note?

## 2022-02-27 ENCOUNTER — Other Ambulatory Visit: Payer: Self-pay

## 2022-02-27 NOTE — Telephone Encounter (Signed)
See phone note 7/10 

## 2022-03-02 ENCOUNTER — Other Ambulatory Visit: Payer: Self-pay

## 2022-03-02 MED ORDER — CLONIDINE HCL 0.1 MG PO TABS: 0.1000 mg | ORAL_TABLET | Freq: Three times a day (TID) | ORAL | 2 refills | Status: DC

## 2022-03-02 NOTE — Addendum Note (Signed)
Addended by: Alver Sorrow on: 03/02/2022 09:47 AM   Modules accepted: Orders

## 2022-03-15 ENCOUNTER — Other Ambulatory Visit (HOSPITAL_COMMUNITY): Payer: Self-pay | Admitting: Family

## 2022-03-15 ENCOUNTER — Other Ambulatory Visit: Payer: Self-pay

## 2022-03-15 MED ORDER — AMLODIPINE BESYLATE 10 MG PO TABS
10.0000 mg | ORAL_TABLET | Freq: Every day | ORAL | 11 refills | Status: DC
  Filled 2022-03-15: qty 30, 30d supply, fill #0
  Filled 2022-04-12: qty 30, 30d supply, fill #1
  Filled 2022-05-17: qty 30, 30d supply, fill #2
  Filled 2022-06-17: qty 30, 30d supply, fill #3
  Filled 2022-07-20: qty 30, 30d supply, fill #4
  Filled 2022-08-23: qty 30, 30d supply, fill #5
  Filled 2022-09-25: qty 30, 30d supply, fill #6
  Filled 2022-11-20 – 2022-11-29 (×2): qty 30, 30d supply, fill #7
  Filled 2022-12-27 (×2): qty 30, 30d supply, fill #8
  Filled 2023-01-21: qty 30, 30d supply, fill #9
  Filled 2023-03-07: qty 30, 30d supply, fill #10

## 2022-03-15 NOTE — Telephone Encounter (Signed)
Pt of Dr. Turner. Please review for refill. Thank you! 

## 2022-03-19 ENCOUNTER — Other Ambulatory Visit: Payer: Self-pay

## 2022-03-19 ENCOUNTER — Ambulatory Visit (INDEPENDENT_AMBULATORY_CARE_PROVIDER_SITE_OTHER): Payer: Self-pay | Admitting: Pharmacist Clinician (PhC)/ Clinical Pharmacy Specialist

## 2022-03-19 ENCOUNTER — Encounter: Payer: Self-pay | Admitting: Pharmacist Clinician (PhC)/ Clinical Pharmacy Specialist

## 2022-03-19 DIAGNOSIS — I159 Secondary hypertension, unspecified: Secondary | ICD-10-CM

## 2022-03-19 NOTE — Patient Instructions (Addendum)
Return for a a follow up appointment Monday September 11 at 10:30 am  Check your blood pressure at home twice daily and keep record of the readings.  Take your BP meds as follows:   3 am, 12 noon, 8:30 pm  3 am - hydralazine 100 mg, clonidine 0.1 mg, spironolactone 25 mg, carvedilol 25 mg   12 pm - hydralazine 100 mg, clonidine 0.1 mg, chlorthalidone 25 mg, isosorbide mono 30 mg, carvedilol 25 mg  8:30 pm - hydralazine 100 mg, clonidine 0.1 mg, amlodipine 10 mg   Send me a list of the herbs in your teas.  There are some herbal products that can increase BP, let's make sure you're not using the wrong teas.    Bring all of your meds, your BP cuff and your record of home blood pressures to your next appointment.  Exercise as you're able, try to walk approximately 30 minutes per day.  Keep salt intake to a minimum, especially watch canned and prepared boxed foods.  Eat more fresh fruits and vegetables and fewer canned items.  Avoid eating in fast food restaurants.    HOW TO TAKE YOUR BLOOD PRESSURE: Rest 5 minutes before taking your blood pressure.  Don't smoke or drink caffeinated beverages for at least 30 minutes before. Take your blood pressure before (not after) you eat. Sit comfortably with your back supported and both feet on the floor (don't cross your legs). Elevate your arm to heart level on a table or a desk. Use the proper sized cuff. It should fit smoothly and snugly around your bare upper arm. There should be enough room to slip a fingertip under the cuff. The bottom edge of the cuff should be 1 inch above the crease of the elbow. Ideally, take 3 measurements at one sitting and record the average.

## 2022-03-19 NOTE — Assessment & Plan Note (Signed)
Patient with resistant hypertension, currently on 6 medications.  Recent echo showed EF dropped to 40%.  Unable to use Entresto/ARB due to previous facial swelling with lisinopril.  Readings have started to improve, as 7 am readings for August show 5 of the 7 < 140 systolic.  Reviewed her work schedule in relation to when she tries to take medications.  Have given her a new schedule for timing of medications as well as when to check BP (am - noon, pm - 2-3 am when home from work.  Also divided medications into three times daily for ease and reviewed BP technique.  We gave her a 7 day pill minder for her 8:30 pm meds that she takes while on break at work.  She will continue with this schedule for one month and return for follow up.

## 2022-03-19 NOTE — Progress Notes (Signed)
03/19/2022 [name] 1976-08-13 [id number]   HPI:  Robin Jarvis is a 46 y.o. female patient of Dr Mayford Knife, who presents today for advanced hypertension clinic follow up.  Other history significant for CHF (EF 40%).  She was referred to the [facility] after hospital admission for hypertensive emergency.   She had been dealing with non-compliance secondary to financial constraints.  Unfortunately she had cancelled her health insurance (through employer) earlier this year due to high costs.  She is feeling much better now, although has some frustration with the number of medications for hypertension and the lack of consistently good readings.  Patient reports that she had hypertension during all 3 pregnancies (20+ years ago), but after the third her pressure never seemed to go back down.  When she saw Gillian Shields her pressure was 142/91.  No changes were made, as the amlodipine had just been started the day prior.    Today she returns for follow up.  States she is feeling well overall and would just like to have a simpler regimen for medications.  Currently she goes to be around 4 am, wakes at 7 to take am meds then goes back to sleep until noon.  Breakfast is at noon, when she takes her 2nd doses of tid medications.  She then goes to work around 4 pm and takes evening meds at break around 11:30 pm.   Has been checking BP at 7 am when she wakes, then again before leaving for work around 5 pm.    Blood Pressure Goal:  130/80  Current Medications: amlodipine 10 mg qd - pm, carvedilol 25 mg bid, chlorthalidone 25 mg qd, clonidine 0.1 mg tid, hydralazine 100 mg tid, isosorbide mono 30 mg qd, spironolactone 25 mg qd  Family Hx: both parents deceased AIDS; maternal aunt had hypertension; 4 siblings healthy; 3 kids, daughter with hypertension (6'3", 200+ lbs)  Social Hx: no tobacco, occasional glass of wine; no regular caffeine - stopped coffee; no soda  Diet: loves to cook, uses appropriate seasonings, no  salt; lots of fruits/vegetables, greek low fat yogurt; melons; protein is mostly chicken, ground [place], occasional beans  Exercise: 30 minute walks at least 4 times per week, brisk pace  Home BP readings:   31 morning readings average :150/91 HR 60 (range 125-186/75-118)  22 evening readings average - 166/102 HR 61 (range 135-204/81-123)  Intolerances: lisinopril - angioedema (not a candidate for Entresto)  Labs: 02/14/22:  Na 136, K 3.6, Glu 103, BUN 23, SCr 1.51, GFR 44   Wt Readings from Last 3 Encounters:  03/19/22 229 lb (103.9 kg)  02/15/22 227 lb (103 kg)  02/14/22 225 lb 9.6 oz (102.3 kg)   BP Readings from Last 3 Encounters:  03/19/22 133/85  02/15/22 (!) 142/91  02/14/22 (!) 170/110   Pulse Readings from Last 3 Encounters:  03/19/22 62  02/15/22 60  02/14/22 64    Current Outpatient Medications  Medication Sig Dispense Refill   amLODipine (NORVASC) 10 MG tablet Take 1 tablet (10 mg total) by mouth at bedtime. 30 tablet 11   carvedilol (COREG) 25 MG tablet Take 1 tablet (25 mg total) by mouth 2 (two) times daily with a meal. 60 tablet 3   chlorthalidone (HYGROTON) 25 MG tablet Take 1 tablet (25 mg total) by mouth daily. 30 tablet 3   cloNIDine (CATAPRES) 0.1 MG tablet Take 1 tablet (0.1 mg total) by mouth 3 (three) times daily. 90 tablet 2   dapagliflozin propanediol (FARXIGA)  10 MG TABS tablet Take 1 tablet (10 mg total) by mouth daily. 30 tablet 3   hydrALAZINE (APRESOLINE) 100 MG tablet Take 1 tablet (100 mg total) by mouth every 8 (eight) hours. 90 tablet 3   isosorbide mononitrate (IMDUR) 30 MG 24 hr tablet Take 1 tablet (30 mg total) by mouth daily. 30 tablet 3   Multiple Vitamin (MULTIVITAMIN WITH MINERALS) TABS tablet Take 1 tablet by mouth daily.     spironolactone (ALDACTONE) 25 MG tablet Take 1 tablet (25 mg total) by mouth daily. 30 tablet 3   No current facility-administered medications for this visit.    Allergies  Allergen Reactions   Lisinopril  Swelling    Discussed with patient 01/31/2022 more information about reaction to lisinopril. Patient transitioned off ?labetalol used during pregnancy to lisinopril post-partum and developed lip swelling, which was noticed in office visit and medication was stopped. Patient never developed SOB or trouble breathing and did not need to go to the hospital. She is amendable to trying an ARB if done inpatient.      Past Medical History:  Diagnosis Date   Herpes    Hypertension    Migraine     Blood pressure 133/85, pulse 62, height 5\' 9"  (1.753 m), weight 229 lb (103.9 kg).  HTN (hypertension) Patient with resistant hypertension, currently on 6 medications.  Recent echo showed EF dropped to 40%.  Unable to use Entresto/ARB due to previous facial swelling with lisinopril.  Readings have started to improve, as 7 am readings for August show 5 of the 7 < 140 systolic.  Reviewed her work schedule in relation to when she tries to take medications.  Have given her a new schedule for timing of medications as well as when to check BP (am - noon, pm - 2-3 am when home from work.  Also divided medications into three times daily for ease and reviewed BP technique.  We gave her a 7 day pill minder for her 8:30 pm meds that she takes while on break at work.  She will continue with this schedule for one month and return for follow up.     September PharmD CPP [facility] [address] [place], [place] [phone]

## 2022-03-28 NOTE — Addendum Note (Signed)
Addended by: Theresia Majors on: 03/28/2022 10:15 AM   Modules accepted: Orders

## 2022-03-29 ENCOUNTER — Other Ambulatory Visit: Payer: Self-pay

## 2022-04-02 ENCOUNTER — Other Ambulatory Visit: Payer: Self-pay

## 2022-04-03 ENCOUNTER — Telehealth (HOSPITAL_BASED_OUTPATIENT_CLINIC_OR_DEPARTMENT_OTHER): Payer: Self-pay | Admitting: *Deleted

## 2022-04-03 DIAGNOSIS — R899 Unspecified abnormal finding in specimens from other organs, systems and tissues: Secondary | ICD-10-CM

## 2022-04-03 NOTE — Telephone Encounter (Signed)
Called [facility] to follow up on referral to Dr Willeen Cass office  Left message to call back

## 2022-04-04 NOTE — Telephone Encounter (Signed)
Sydell Axon with [facility] calling back. Transferred to [place], LPN.

## 2022-04-04 NOTE — Telephone Encounter (Signed)
Spoke with [facility] and they do not see patients who have no insurance and are self pay   Will forward to Dr Mayford Knife and Harrell Lark PA so they will be aware

## 2022-04-10 NOTE — Telephone Encounter (Signed)
Order place for [facility]ocr[facility]ology

## 2022-04-12 ENCOUNTER — Other Ambulatory Visit: Payer: Self-pay

## 2022-04-13 ENCOUNTER — Other Ambulatory Visit: Payer: Self-pay

## 2022-04-13 ENCOUNTER — Ambulatory Visit: Payer: Self-pay | Attending: Nurse Practitioner | Admitting: Nurse Practitioner

## 2022-04-13 ENCOUNTER — Encounter: Payer: Self-pay | Admitting: Nurse Practitioner

## 2022-04-13 VITALS — BP 135/79 | HR 71 | Temp 98.1°F | Ht 69.0 in | Wt 232.4 lb

## 2022-04-13 DIAGNOSIS — Z7689 Persons encountering health services in other specified circumstances: Secondary | ICD-10-CM

## 2022-04-13 DIAGNOSIS — Z1231 Encounter for screening mammogram for malignant neoplasm of breast: Secondary | ICD-10-CM

## 2022-04-13 DIAGNOSIS — I159 Secondary hypertension, unspecified: Secondary | ICD-10-CM

## 2022-04-13 NOTE — Progress Notes (Signed)
Assessment & Plan:  [name] was seen today for establish care.  Diagnoses and all orders for this visit:  Encounter to establish care  Secondary hypertension -     CMP14+EGFR  Breast cancer screening by mammogram -     MS DIGITAL SCREENING TOMO BILATERAL; Future    Patient has been counseled on age-appropriate routine health concerns for screening and prevention. These are reviewed and up-to-date. Referrals have been placed accordingly. Immunizations are up-to-date or declined.    Subjective:   Chief Complaint  Patient presents with   Establish Care   HPI Robin Jarvis 46 y.o. female is a very pleasant lady who presents to office today to establish care.  She has a past medical history of Herpes, Hypertension, NICM, EF 40%, and Migraine.   Patient has been counseled on age-appropriate routine health concerns for screening and prevention. These are reviewed and up-to-date. Referrals have been placed accordingly. Immunizations are up-to-date or declined.   MAMMOGRAM: Overdue. Referred to [facility] PAP Smear: Overdue. Schedule for next visit      HTN Blood pressure well controlled with amlodipine 10 mg daily, carvedilol 25 mg BID, chlorthalidone 25 mg daily, clonidiine 0.77m TID, hydralazine 100 mg TID, imdur 30 mg daily, spironolactone 25 mg daily. She is also being followed by Cardiology BP Readings from Last 3 Encounters:  04/13/22 135/79  03/19/22 133/85  02/15/22 (!) 142/91    Review of Systems  Constitutional:  Negative for fever, malaise/fatigue and weight loss.  HENT: Negative.  Negative for nosebleeds.   Eyes: Negative.  Negative for blurred vision, double vision and photophobia.  Respiratory: Negative.  Negative for cough and shortness of breath.   Cardiovascular: Negative.  Negative for chest pain, palpitations and leg swelling.  Gastrointestinal: Negative.  Negative for heartburn, nausea and vomiting.  Musculoskeletal: Negative.  Negative for myalgias.   Neurological: Negative.  Negative for dizziness, focal weakness, seizures and headaches.  Psychiatric/Behavioral: Negative.  Negative for suicidal ideas.     Past Medical History:  Diagnosis Date   Herpes    Hypertension    Migraine     Past Surgical History:  Procedure Laterality Date   CESAREAN SECTION     TUBAL LIGATION      Family History  Problem Relation Age of Onset   HIV/AIDS Mother    HIV/AIDS Father    Hypertension Maternal Aunt    Hyperlipidemia Maternal Aunt    Diabetes Maternal Aunt     Social History Reviewed with no changes to be made today.   Outpatient Medications Prior to Visit  Medication Sig Dispense Refill   amLODipine (NORVASC) 10 MG tablet Take 1 tablet (10 mg total) by mouth at bedtime. 30 tablet 11   carvedilol (COREG) 25 MG tablet Take 1 tablet (25 mg total) by mouth 2 (two) times daily with a meal. 60 tablet 3   chlorthalidone (HYGROTON) 25 MG tablet Take 1 tablet (25 mg total) by mouth daily. 30 tablet 3   cloNIDine (CATAPRES) 0.1 MG tablet Take 1 tablet (0.1 mg total) by mouth 3 (three) times daily. 90 tablet 2   dapagliflozin propanediol (FARXIGA) 10 MG TABS tablet Take 1 tablet (10 mg total) by mouth daily. 30 tablet 3   hydrALAZINE (APRESOLINE) 100 MG tablet Take 1 tablet (100 mg total) by mouth every 8 (eight) hours. 90 tablet 3   isosorbide mononitrate (IMDUR) 30 MG 24 hr tablet Take 1 tablet (30 mg total) by mouth daily. 30 tablet 3   Multiple Vitamin (  MULTIVITAMIN WITH MINERALS) TABS tablet Take 1 tablet by mouth daily.     spironolactone (ALDACTONE) 25 MG tablet Take 1 tablet (25 mg total) by mouth daily. 30 tablet 3   No facility-administered medications prior to visit.    Allergies  Allergen Reactions   Lisinopril Swelling    Discussed with patient 01/31/2022 more information about reaction to lisinopril. Patient transitioned off ?labetalol used during pregnancy to lisinopril post-partum and developed lip swelling, which was  noticed in office visit and medication was stopped. Patient never developed SOB or trouble breathing and did not need to go to the hospital. She is amendable to trying an ARB if done inpatient.         Objective:    BP 135/79   Pulse 71   Temp 98.1 F (36.7 C) (Oral)   Ht 5' 9" (1.753 m)   Wt 232 lb 6.4 oz (105.4 kg)   SpO2 98%   BMI 34.32 kg/m  Wt Readings from Last 3 Encounters:  04/13/22 232 lb 6.4 oz (105.4 kg)  03/19/22 229 lb (103.9 kg)  02/15/22 227 lb (103 kg)    Physical Exam Vitals and nursing note reviewed.  Constitutional:      Appearance: She is well-developed.  HENT:     Head: Normocephalic and atraumatic.  Cardiovascular:     Rate and Rhythm: Normal rate and regular rhythm.     Heart sounds: Normal heart sounds. No murmur heard.    No friction rub. No gallop.  Pulmonary:     Effort: Pulmonary effort is normal. No tachypnea or respiratory distress.     Breath sounds: Normal breath sounds. No decreased breath sounds, wheezing, rhonchi or rales.  Chest:     Chest wall: No tenderness.  Abdominal:     General: Bowel sounds are normal.     Palpations: Abdomen is soft.  Musculoskeletal:        General: Normal range of motion.     Cervical back: Normal range of motion.  Skin:    General: Skin is warm and dry.  Neurological:     Mental Status: She is alert and oriented to person, place, and time.     Coordination: Coordination normal.  Psychiatric:        Behavior: Behavior normal. Behavior is cooperative.        Thought Content: Thought content normal.        Judgment: Judgment normal.          Patient has been counseled extensively about nutrition and exercise as well as the importance of adherence with medications and regular follow-up. The patient was given clear instructions to go to ER or return to medical center if symptoms don't improve, worsen or new problems develop. The patient verbalized understanding.   Follow-up: Return for schedule for  PAP smear.   Gildardo Pounds, FNP-BC [facility] [place]   04/13/2022, 10:02 PM

## 2022-04-14 LAB — CMP14+EGFR
ALT: 14 IU/L (ref 0–32)
AST: 15 IU/L (ref 0–40)
Albumin/Globulin Ratio: 1.1 — ABNORMAL LOW (ref 1.2–2.2)
Albumin: 4.1 g/dL (ref 3.9–4.9)
Alkaline Phosphatase: 63 IU/L (ref 44–121)
BUN/Creatinine Ratio: 17 (ref 9–23)
BUN: 28 mg/dL — ABNORMAL HIGH (ref 6–24)
Bilirubin Total: 0.3 mg/dL (ref 0.0–1.2)
CO2: 26 mmol/L (ref 20–29)
Calcium: 10 mg/dL (ref 8.7–10.2)
Chloride: 97 mmol/L (ref 96–106)
Creatinine, Ser: 1.66 mg/dL — ABNORMAL HIGH (ref 0.57–1.00)
Globulin, Total: 3.9 g/dL (ref 1.5–4.5)
Glucose: 97 mg/dL (ref 70–99)
Potassium: 3.6 mmol/L (ref 3.5–5.2)
Sodium: 137 mmol/L (ref 134–144)
Total Protein: 8 g/dL (ref 6.0–8.5)
eGFR: 39 mL/min/{1.73_m2} — ABNORMAL LOW (ref >59)

## 2022-04-15 ENCOUNTER — Other Ambulatory Visit: Payer: Self-pay | Admitting: Nurse Practitioner

## 2022-04-15 DIAGNOSIS — R7989 Other specified abnormal findings of blood chemistry: Secondary | ICD-10-CM

## 2022-04-23 ENCOUNTER — Other Ambulatory Visit: Payer: Self-pay

## 2022-04-23 ENCOUNTER — Ambulatory Visit: Payer: Self-pay | Attending: Internal Medicine | Admitting: Pharmacist Clinician (PhC)/ Clinical Pharmacy Specialist

## 2022-04-23 DIAGNOSIS — I159 Secondary hypertension, unspecified: Secondary | ICD-10-CM

## 2022-04-23 MED ORDER — CLONIDINE HCL 0.2 MG PO TABS
0.2000 mg | ORAL_TABLET | Freq: Two times a day (BID) | ORAL | 3 refills | Status: DC
  Filled 2022-04-23: qty 180, 90d supply, fill #0
  Filled 2022-04-30: qty 60, 30d supply, fill #0
  Filled 2022-05-28: qty 60, 30d supply, fill #1

## 2022-04-23 NOTE — Patient Instructions (Signed)
  Check your blood pressure at home daily and keep record of the readings.  Take your BP meds as follows:  Increase clonidine to 0.2 mg three times daily (take 2 of the 0.1 mg tabs until gone)  Continue with all other medications  I will talk to Dr. Duke Salvia about the kidney scan or sleep apnea testing to see if either is appropriate and determine costs.   Bring all of your meds, your BP cuff and your record of home blood pressures to your next appointment.  Exercise as you're able, try to walk approximately 30 minutes per day.  Keep salt intake to a minimum, especially watch canned and prepared boxed foods.  Eat more fresh fruits and vegetables and fewer canned items.  Avoid eating in fast food restaurants.    HOW TO TAKE YOUR BLOOD PRESSURE: Rest 5 minutes before taking your blood pressure.  Don't smoke or drink caffeinated beverages for at least 30 minutes before. Take your blood pressure before (not after) you eat. Sit comfortably with your back supported and both feet on the floor (don't cross your legs). Elevate your arm to heart level on a table or a desk. Use the proper sized cuff. It should fit smoothly and snugly around your bare upper arm. There should be enough room to slip a fingertip under the cuff. The bottom edge of the cuff should be 1 inch above the crease of the elbow. Ideally, take 3 measurements at one sitting and record the average.

## 2022-04-23 NOTE — Progress Notes (Signed)
04/24/2022 Robin Jarvis Aug 02, 1976 [id number]   HPI:  Robin Jarvis is a 46 y.o. female patient of Dr Mayford Knife, who presents today for advanced hypertension clinic follow up.  Other history significant for CHF (EF 40%).  She was referred to the [facility] after hospital admission for hypertensive emergency.   She had been dealing with non-compliance secondary to financial constraints.  Unfortunately she had cancelled her health insurance (through employer) earlier this year due to high costs.  She is feeling much better now, although has some frustration with the number of medications for hypertension and the lack of consistently good readings.  Patient reports that she had hypertension during all 3 pregnancies (20+ years ago), but after the third her pressure never seemed to go back down.  When she saw Gillian Shields her pressure was 142/91.  No changes were made, as the amlodipine had just been started the day prior.  At her last visit with me in August we tried to simplify her medication regimen.  She works a second shift job and was having trouble figuring out best time to take meds.  Since then she saw her PCP and it was noted her SCr had bumped up to 1.66 (had ben as low as 1.09 in June, but increasing since then).  They asked her to cut chlorthalidone tablet to 12.5 mg daily.    Today she returns for follow up.  States feeling well overall, but does have some stress at home with son.  Has not been working much this past few weeks due to lack of work at her job with [employer]  She is looking for second (part-time) job.    Blood Pressure Goal:  130/80  Current Medications:  3 am - hydralazine 100 mg, clonidine 0.1 mg, spironolactone 25 mg, carvedilol 25 mg  12 pm - hydralazine 100 mg, clonidine 0.1 mg, chlorthalidone 25 mg, isosorbide mono 30 mg, carvedilol 25 mg 8:30 pm - hydralazine 100 mg, clonidine 0.1 mg, amlodipine 10 mg   Family Hx: both parents deceased AIDS; maternal aunt had  hypertension; 4 siblings healthy; 3 kids, daughter with hypertension (6'3", 200+ lbs)  Social Hx: no tobacco, occasional glass of wine; no regular caffeine - stopped coffee; no soda  Diet: loves to cook, uses appropriate seasonings, no salt; lots of fruits/vegetables, greek low fat yogurt; melons; protein is mostly chicken, ground [place], occasional beans  Exercise: 30 minute walks at least 4 times per week, brisk pace  Home BP readings:   38 morning readings average 145/92 (range 125-177/72-109) previous average :150/91   17 evening readings average 146/91 (range 125-176/75-111) previous average  166/102   Intolerances: lisinopril - angioedema (not a candidate for Entresto)  Labs: 02/14/22:  Na 136, K 3.6, Glu 103, BUN 23, SCr 1.51, GFR 44   Wt Readings from Last 3 Encounters:  04/13/22 232 lb 6.4 oz (105.4 kg)  03/19/22 229 lb (103.9 kg)  02/15/22 227 lb (103 kg)   BP Readings from Last 3 Encounters:  04/24/22 (!) 147/96  04/13/22 135/79  03/19/22 133/85   Pulse Readings from Last 3 Encounters:  04/23/22 68  04/13/22 71  03/19/22 62    Current Outpatient Medications  Medication Sig Dispense Refill   cloNIDine (CATAPRES) 0.2 MG tablet Take 1 tablet (0.2 mg total) by mouth 2 (two) times daily. 180 tablet 3   amLODipine (NORVASC) 10 MG tablet Take 1 tablet (10 mg total) by mouth at bedtime. 30 tablet 11   carvedilol (  COREG) 25 MG tablet Take 1 tablet (25 mg total) by mouth 2 (two) times daily with a meal. 60 tablet 3   chlorthalidone (HYGROTON) 25 MG tablet Take 1 tablet (25 mg total) by mouth daily. 30 tablet 3   dapagliflozin propanediol (FARXIGA) 10 MG TABS tablet Take 1 tablet (10 mg total) by mouth daily. 30 tablet 3   hydrALAZINE (APRESOLINE) 100 MG tablet Take 1 tablet (100 mg total) by mouth every 8 (eight) hours. 90 tablet 3   isosorbide mononitrate (IMDUR) 30 MG 24 hr tablet Take 1 tablet (30 mg total) by mouth daily. 30 tablet 3   Multiple Vitamin (MULTIVITAMIN WITH  MINERALS) TABS tablet Take 1 tablet by mouth daily.     spironolactone (ALDACTONE) 25 MG tablet Take 1 tablet (25 mg total) by mouth daily. 30 tablet 3   No current facility-administered medications for this visit.    Allergies  Allergen Reactions   Lisinopril Swelling    Discussed with patient 01/31/2022 more information about reaction to lisinopril. Patient transitioned off ?labetalol used during pregnancy to lisinopril post-partum and developed lip swelling, which was noticed in office visit and medication was stopped. Patient never developed SOB or trouble breathing and did not need to go to the hospital. She is amendable to trying an ARB if done inpatient.      Past Medical History:  Diagnosis Date   Herpes    Hypertension    Migraine     Blood pressure (!) 147/96, pulse 68.  HTN (hypertension) Patient with resistant hypertension, currently on 6 medications, but not at goal.  Has improved somewhat since last visit.  Limited with medications we can increase because of recent increase in SCr.  Will have her increase clonidine to 0.2 mg three times daily for now.  Secondary screening for OSA and RAS worth looking into cost for patient.  She will return in 1 month for follow up, then 2 months with Dr. Duke Salvia.     Phillips Hay PharmD CPP [facility] [address] [place], [place] [address] [phone]

## 2022-04-24 ENCOUNTER — Encounter: Payer: Self-pay | Admitting: Pharmacist Clinician (PhC)/ Clinical Pharmacy Specialist

## 2022-04-24 NOTE — Assessment & Plan Note (Signed)
Patient with resistant hypertension, currently on 6 medications, but not at goal.  Has improved somewhat since last visit.  Limited with medications we can increase because of recent increase in SCr.  Will have her increase clonidine to 0.2 mg three times daily for now.  Secondary screening for OSA and RAS worth looking into cost for patient.  She will return in 1 month for follow up, then 2 months with Dr. Duke Salvia.

## 2022-04-30 ENCOUNTER — Other Ambulatory Visit: Payer: Self-pay

## 2022-04-30 ENCOUNTER — Encounter: Payer: Self-pay | Admitting: Pharmacist Clinician (PhC)/ Clinical Pharmacy Specialist

## 2022-05-03 ENCOUNTER — Telehealth: Payer: Self-pay

## 2022-05-03 NOTE — Telephone Encounter (Signed)
Telephoned patient at home number. Left a voice message with [facility] (scholarship) contact information. 

## 2022-05-17 ENCOUNTER — Other Ambulatory Visit: Payer: Self-pay

## 2022-05-21 ENCOUNTER — Ambulatory Visit: Payer: Self-pay | Attending: Internal Medicine

## 2022-05-21 NOTE — Progress Notes (Deleted)
05/21/2022 Robin Jarvis 10-10-1975 [id number]   HPI:  Robin Jarvis is a 46 y.o. female patient of Dr Radford Pax, who presents today for advanced hypertension clinic follow up.  Other history significant for CHF (EF 40%).  She was referred to the [facility] after hospital admission for hypertensive emergency.   She had been dealing with non-compliance secondary to financial constraints.  Unfortunately she had cancelled her health insurance (through employer) earlier this year due to high costs.  She is feeling much better now, although has some frustration with the number of medications for hypertension and the lack of consistently good readings.  Patient reports that she had hypertension during all 3 pregnancies (20+ years ago), but after the third her pressure never seemed to go back down.  When she saw Laurann Montana her pressure was 142/91.  No changes were made, as the amlodipine had just been started the day prior.  At her last visit with me in August we tried to simplify her medication regimen.  She works a second shift job and was having trouble figuring out best time to take meds.  Since then she saw her PCP and it was noted her SCr had bumped up to 1.66 (had ben as low as 1.09 in June, but increasing since then).  They asked her to cut chlorthalidone tablet to 12.5 mg daily.    Today she returns for follow up.  States feeling well overall, but does have some stress at home with son.  Has not been working much this past few weeks due to lack of work at her job with [employer].  She is looking for second (part-time) job.    Since her last visit, she reported some home heart rates down into the 40's.   She was asked to cut carvedilol dose by half, to 12.5 mg twice daily.  RAS negative  Blood Pressure Goal:  130/80  Current Medications:  3 am - hydralazine 100 mg, clonidine 0.2 mg, spironolactone 25 mg, carvedilol 25 mg  12 pm - hydralazine 100 mg, clonidine 0.2 mg, chlorthalidone 25 mg, isosorbide  mono 30 mg, carvedilol 25 mg 8:30 pm - hydralazine 100 mg, clonidine 0.2 mg, amlodipine 10 mg   Family Hx: both parents deceased AIDS; maternal aunt had hypertension; 4 siblings healthy; 3 kids, daughter with hypertension (6'3", 200+ lbs)  Social Hx: no tobacco, occasional glass of wine; no regular caffeine - stopped coffee; no soda  Diet: loves to cook, uses appropriate seasonings, no salt; lots of fruits/vegetables, greek low fat yogurt; melons; protein is mostly chicken, ground [place], occasional beans  Exercise: 30 minute walks at least 4 times per week, brisk pace  Home BP readings:   38 morning readings average 145/92 (range 125-177/72-109) previous average :150/91   17 evening readings average 146/91 (range 125-176/75-111) previous average  166/102   Intolerances: lisinopril - angioedema (not a candidate for Entresto)  Labs: 02/14/22:  Na 136, K 3.6, Glu 103, BUN 23, SCr 1.51, GFR 44   Wt Readings from Last 3 Encounters:  04/13/22 232 lb 6.4 oz (105.4 kg)  03/19/22 229 lb (103.9 kg)  02/15/22 227 lb (103 kg)   BP Readings from Last 3 Encounters:  04/24/22 (!) 147/96  04/13/22 135/79  03/19/22 133/85   Pulse Readings from Last 3 Encounters:  04/23/22 68  04/13/22 71  03/19/22 62    Current Outpatient Medications  Medication Sig Dispense Refill   amLODipine (NORVASC) 10 MG tablet Take 1 tablet (10  mg total) by mouth at bedtime. 30 tablet 11   carvedilol (COREG) 25 MG tablet Take 1 tablet (25 mg total) by mouth 2 (two) times daily with a meal. 60 tablet 3   chlorthalidone (HYGROTON) 25 MG tablet Take 1 tablet (25 mg total) by mouth daily. 30 tablet 3   cloNIDine (CATAPRES) 0.2 MG tablet Take 1 tablet (0.2 mg total) by mouth 2 (two) times daily. 180 tablet 3   dapagliflozin propanediol (FARXIGA) 10 MG TABS tablet Take 1 tablet (10 mg total) by mouth daily. 30 tablet 3   hydrALAZINE (APRESOLINE) 100 MG tablet Take 1 tablet (100 mg total) by mouth every 8 (eight) hours. 90  tablet 3   isosorbide mononitrate (IMDUR) 30 MG 24 hr tablet Take 1 tablet (30 mg total) by mouth daily. 30 tablet 3   Multiple Vitamin (MULTIVITAMIN WITH MINERALS) TABS tablet Take 1 tablet by mouth daily.     spironolactone (ALDACTONE) 25 MG tablet Take 1 tablet (25 mg total) by mouth daily. 30 tablet 3   No current facility-administered medications for this visit.    Allergies  Allergen Reactions   Lisinopril Swelling    Discussed with patient 01/31/2022 more information about reaction to lisinopril. Patient transitioned off ?labetalol used during pregnancy to lisinopril post-partum and developed lip swelling, which was noticed in office visit and medication was stopped. Patient never developed SOB or trouble breathing and did not need to go to the hospital. She is amendable to trying an ARB if done inpatient.      Past Medical History:  Diagnosis Date   Herpes    Hypertension    Migraine     There were no vitals taken for this visit.  No problem-specific Assessment & Plan notes found for this encounter.    Phillips Hay PharmD CPP [facility] [address] [place], [place] [address] [phone]

## 2022-05-22 ENCOUNTER — Ambulatory Visit: Payer: Self-pay | Admitting: Nurse Practitioner

## 2022-05-28 ENCOUNTER — Other Ambulatory Visit: Payer: Self-pay

## 2022-05-29 ENCOUNTER — Ambulatory Visit: Payer: Self-pay | Admitting: Family Medicine

## 2022-05-30 ENCOUNTER — Other Ambulatory Visit: Payer: Self-pay

## 2022-06-04 ENCOUNTER — Encounter (HOSPITAL_BASED_OUTPATIENT_CLINIC_OR_DEPARTMENT_OTHER): Payer: Self-pay | Admitting: Cardiology

## 2022-06-04 ENCOUNTER — Encounter: Payer: Self-pay | Admitting: Pharmacist Clinician (PhC)/ Clinical Pharmacy Specialist

## 2022-06-05 ENCOUNTER — Other Ambulatory Visit: Payer: Self-pay

## 2022-06-05 MED ORDER — CHLORTHALIDONE 25 MG PO TABS: 25.0000 mg | ORAL_TABLET | Freq: Every day | ORAL | 9 refills | Status: DC

## 2022-06-05 MED ORDER — ISOSORBIDE MONONITRATE ER 30 MG PO TB24
30.0000 mg | ORAL_TABLET | Freq: Every day | ORAL | 9 refills | Status: DC
  Filled 2022-06-05: qty 30, 30d supply, fill #0
  Filled 2022-07-01: qty 30, 30d supply, fill #1
  Filled 2022-07-31: qty 30, 30d supply, fill #2
  Filled 2022-08-30: qty 30, 30d supply, fill #3
  Filled 2022-10-15 (×2): qty 30, 30d supply, fill #4
  Filled 2022-11-20 – 2022-11-29 (×2): qty 30, 30d supply, fill #5
  Filled 2022-12-27 (×2): qty 30, 30d supply, fill #6
  Filled 2023-01-21: qty 30, 30d supply, fill #7
  Filled 2023-03-07: qty 30, 30d supply, fill #8
  Filled 2023-04-16 (×2): qty 30, 30d supply, fill #9

## 2022-06-05 MED ORDER — HYDRALAZINE HCL 100 MG PO TABS: 100.0000 mg | ORAL_TABLET | Freq: Three times a day (TID) | ORAL | 9 refills | Status: DC

## 2022-06-05 MED ORDER — SPIRONOLACTONE 25 MG PO TABS: 25.0000 mg | ORAL_TABLET | Freq: Every day | ORAL | 9 refills | Status: DC

## 2022-06-05 MED ORDER — CARVEDILOL 25 MG PO TABS: 25.0000 mg | ORAL_TABLET | Freq: Two times a day (BID) | ORAL | 9 refills | Status: DC

## 2022-06-05 MED ORDER — ISOSORBIDE MONONITRATE ER 30 MG PO TB24: 30.0000 mg | ORAL_TABLET | Freq: Every day | ORAL | 9 refills | Status: DC

## 2022-06-05 MED ORDER — CHLORTHALIDONE 25 MG PO TABS
25.0000 mg | ORAL_TABLET | Freq: Every day | ORAL | 9 refills | Status: DC
  Filled 2022-06-05: qty 30, 30d supply, fill #0

## 2022-06-05 MED ORDER — CARVEDILOL 25 MG PO TABS
25.0000 mg | ORAL_TABLET | Freq: Two times a day (BID) | ORAL | 9 refills | Status: DC
  Filled 2022-06-05: qty 60, 30d supply, fill #0

## 2022-06-05 MED ORDER — HYDRALAZINE HCL 100 MG PO TABS
100.0000 mg | ORAL_TABLET | Freq: Three times a day (TID) | ORAL | 9 refills | Status: DC
  Filled 2022-06-05: qty 90, 30d supply, fill #0
  Filled 2022-07-01: qty 90, 30d supply, fill #1
  Filled 2022-07-31: qty 19, 7d supply, fill #2
  Filled 2022-07-31: qty 71, 23d supply, fill #2
  Filled 2022-08-30: qty 90, 30d supply, fill #3
  Filled 2022-10-15 (×2): qty 90, 30d supply, fill #4
  Filled 2022-11-20 – 2022-11-29 (×2): qty 90, 30d supply, fill #5
  Filled 2022-12-27 (×2): qty 90, 30d supply, fill #6
  Filled 2023-01-21: qty 90, 30d supply, fill #7
  Filled 2023-03-07: qty 90, 30d supply, fill #8
  Filled 2023-04-16 (×2): qty 90, 30d supply, fill #9

## 2022-06-05 MED ORDER — SPIRONOLACTONE 25 MG PO TABS
25.0000 mg | ORAL_TABLET | Freq: Every day | ORAL | 9 refills | Status: DC
  Filled 2022-06-05: qty 30, 30d supply, fill #0
  Filled 2022-07-01: qty 30, 30d supply, fill #1
  Filled 2022-07-31: qty 30, 30d supply, fill #2
  Filled 2022-08-30: qty 30, 30d supply, fill #3
  Filled 2022-10-15 (×2): qty 30, 30d supply, fill #4
  Filled 2022-11-20 (×2): qty 30, 30d supply, fill #5
  Filled 2022-12-27 (×2): qty 30, 30d supply, fill #6
  Filled 2023-01-21 (×2): qty 30, 30d supply, fill #7
  Filled 2023-03-07 (×2): qty 30, 30d supply, fill #8
  Filled 2023-04-16 (×2): qty 30, 30d supply, fill #9

## 2022-06-05 NOTE — Addendum Note (Signed)
Addended by: Dickenson Kitten D on: 06/05/2022 12:48 PM   Modules accepted: Orders

## 2022-06-06 ENCOUNTER — Other Ambulatory Visit: Payer: Self-pay

## 2022-06-06 ENCOUNTER — Encounter: Payer: Self-pay | Admitting: Cardiovascular Disease

## 2022-06-12 ENCOUNTER — Encounter (HOSPITAL_BASED_OUTPATIENT_CLINIC_OR_DEPARTMENT_OTHER): Payer: Self-pay | Admitting: Cardiology

## 2022-06-13 ENCOUNTER — Telehealth: Payer: Self-pay | Admitting: *Deleted

## 2022-06-13 NOTE — Telephone Encounter (Signed)
Our office received a clearance request from dental office, all boxes were checked off.  I did reach out to the DDS office to confirm the procedure she is going to have done first. I informed the office that we cannot provide a blanket type clearance. It was stated to me that the pt had come in for a dental cleaning and x-rays, but BP was high. Was stated that Dr. Amado Coe, DMD just wanted to be sure the pt could proceed with any of the dental procedures. I again stated that we will need to know the procedure that will be done first. If any teeth are going to be extracted, we will need to know how many teeth are being extracted (not the # of tooth location in the mouth please); also if they are surgical or simple extractions. I did inform the DDS office the pt has an appt with Dr. Oval Linsey 06/21/22 for her BP.   I informed the DDS office once they know what the first procedure will be for the pt, to please fax over a new clearance request to our office so that our pre op team may make a well informed decision for plan of care. I will faax this note as FYI to the dds office.

## 2022-06-13 NOTE — Telephone Encounter (Signed)
The pt sent a MY CHART message stating her dental procedure will be a filling to be done.   I then called the DDS office and did confirm the procedure.     Pre-operative Risk Assessment    Patient Name: Robin Jarvis  DOB: November 18, 1975 MRN: [id number]     Request for Surgical Clearance    Procedure:   1 DENTAL FILLING (TOOTH ON THE UPPER RIGHT SIDE)  Date of Surgery:  Clearance 06/22/22                                 Surgeon:  DR. Amado Coe, DMD Surgeon's Group or Practice Name:  [facility] Phone number:  [phone] Fax number:  [phone]   Type of Clearance Requested:   - Medical ; NO MEDICATIONS LISTED AS NEEDING TO BE HELD   Type of Anesthesia:   LIDOCAINE   Additional requests/questions:    Robin Jarvis   06/13/2022, 9:39 AM

## 2022-06-15 ENCOUNTER — Telehealth (HOSPITAL_BASED_OUTPATIENT_CLINIC_OR_DEPARTMENT_OTHER): Payer: Self-pay

## 2022-06-15 MED ORDER — CLONIDINE HCL 0.2 MG PO TABS: 0.2000 mg | ORAL_TABLET | Freq: Two times a day (BID) | ORAL | 2 refills | Status: DC

## 2022-06-15 NOTE — Addendum Note (Signed)
Addended by: Wolz Kitten D on: 06/15/2022 10:35 AM   Modules accepted: Orders

## 2022-06-15 NOTE — Telephone Encounter (Signed)
Received fax from [facility] requesting refills for Clonidine 0.1 mg. Looks like pt is taking Clonidine 0.2 mg.  Routing to [facility] refill pool, pt of Dr. Radford Pax.

## 2022-06-15 NOTE — Telephone Encounter (Signed)
   Patient Name: Robin Jarvis  DOB: Apr 03, 1976 MRN: [id number]  Primary Cardiologist: Fransico Him, MD  Chart reviewed as part of pre-operative protocol coverage.   Simple dental extractions (i.e. 1-2 teeth), cleanings, and fillings are considered low risk procedures per guidelines and generally do not require any specific cardiac clearance. It is also generally accepted that for simple extractions and dental cleanings, there is no need to interrupt blood thinner therapy.   SBE prophylaxis is not required for the patient from a cardiac standpoint.  I will route this recommendation to the requesting party via Epic fax function and remove from pre-op pool.  Please call with questions.  Lenna Sciara, NP 06/15/2022, 9:57 AM

## 2022-06-18 ENCOUNTER — Other Ambulatory Visit: Payer: Self-pay

## 2022-06-19 ENCOUNTER — Ambulatory Visit (HOSPITAL_BASED_OUTPATIENT_CLINIC_OR_DEPARTMENT_OTHER): Payer: Self-pay | Admitting: Cardiovascular Disease

## 2022-06-19 NOTE — Telephone Encounter (Signed)
THIS IS DONE

## 2022-06-20 ENCOUNTER — Other Ambulatory Visit: Payer: Self-pay

## 2022-06-21 ENCOUNTER — Other Ambulatory Visit: Payer: Self-pay

## 2022-06-21 ENCOUNTER — Ambulatory Visit (INDEPENDENT_AMBULATORY_CARE_PROVIDER_SITE_OTHER): Payer: Self-pay | Admitting: Cardiovascular Disease

## 2022-06-21 ENCOUNTER — Encounter (HOSPITAL_BASED_OUTPATIENT_CLINIC_OR_DEPARTMENT_OTHER): Payer: Self-pay | Admitting: Cardiovascular Disease

## 2022-06-21 ENCOUNTER — Telehealth: Payer: Self-pay | Admitting: Licensed Clinical Social Worker

## 2022-06-21 VITALS — BP 178/98 | HR 72 | Ht 69.0 in | Wt 238.6 lb

## 2022-06-21 DIAGNOSIS — I1A0 Resistant hypertension: Secondary | ICD-10-CM

## 2022-06-21 DIAGNOSIS — Z006 Encounter for examination for normal comparison and control in clinical research program: Secondary | ICD-10-CM

## 2022-06-21 DIAGNOSIS — I5042 Chronic combined systolic (congestive) and diastolic (congestive) heart failure: Secondary | ICD-10-CM

## 2022-06-21 HISTORY — DX: Chronic combined systolic (congestive) and diastolic (congestive) heart failure: I50.42

## 2022-06-21 MED ORDER — CLONIDINE HCL 0.2 MG PO TABS
0.2000 mg | ORAL_TABLET | Freq: Three times a day (TID) | ORAL | 3 refills | Status: DC
  Filled 2022-06-21: qty 90, 30d supply, fill #0
  Filled 2022-07-20: qty 90, 30d supply, fill #1

## 2022-06-21 NOTE — Telephone Encounter (Signed)
H&V Care Navigation CSW Progress Note  Clinical Social Worker  mailed pt another copy of the [employer] application  to complete. Pt had been given application and our team had f/u, but I do not see that it has been submitted. I included my card. I remain available, will f/u to see if received.  Patient is participating in a Managed Medicaid Plan:  No, self pay only.   SDOH Screenings   Food Insecurity: No Food Insecurity (01/30/2022)  Housing: Low Risk  (01/30/2022)  Transportation Needs: No Transportation Needs (01/30/2022)  Alcohol Screen: Low Risk  (01/30/2022)  Depression (PHQ2-9): High Risk (02/07/2022)  Financial Resource Strain: High Risk (02/14/2022)  Social Connections: Socially Isolated (02/15/2022)  Tobacco Use: Low Risk  (06/21/2022)   [name], MSW, LCSW Clinical Social Worker II [facility] Navigation  [phone]- work cell phone (preferred) [phone]- desk phone

## 2022-06-21 NOTE — Patient Instructions (Signed)
Medication Instructions:  INCREASE YOUR CLONIDINE TO THREE TIMES A DAY   Labwork: LABS SOON   Testing/Procedures: Your physician has requested that you have an echocardiogram. Echocardiography is a painless test that uses sound waves to create images of your heart. It provides your doctor with information about the size and shape of your heart and how well your heart's chambers and valves are working. This procedure takes approximately one hour. There are no restrictions for this procedure. Please do NOT wear cologne, perfume, aftershave, or lotions (deodorant is allowed). Please arrive 15 minutes prior to your appointment time.  Follow-Up: 07/25/2022 10:30 AM WITH PHARM D   If you need a refill on your cardiac medications before your next appointment, please call your pharmacy.

## 2022-06-21 NOTE — Progress Notes (Signed)
Advanced Hypertension Clinic Initial Assessment:    Date:  06/21/2022   ID:  Robin Jarvis, DOB 1976/04/06, MRN [id number]  PCP:  Claiborne Rigg, NP  Cardiologist:  Armanda Magic, MD  Nephrologist:  Referring MD: Claiborne Rigg, NP   CC: Hypertension  History of Present Illness:    Robin Jarvis is a 46 y.o. female with a hx of chronic systolic and diastolic heart failure, hypertension here for a follow up in the Advanced Hypertension Clinic. She was admitted 01/30/22 with a hypertensive and chest pain emergency. She had not been taking any of her medications at the time. Echo revealed LVF 40% with grade 2 diastolic dysfunction, mildly dilated LV, and severe LVH. Coronary CTA showed no CAD and a calcium score of 0. Renal artery dopplers showed normal flow but sever celiac stenosis. She followed up with Gillian Shields NP in HTN clinic 03/01/22 her blood pressure 142/91. She had recently started Amlodipine and was also on Carvedilol, Chorthalidone, Clonidine, Hydralazine, Spironolactone and Imdur. Renin and aldosterone were normal. Normetanephrine and metanephrine were both elevated and she was referred to endocrine. Prior abdominal CT showed no Adrenal adenomas. She followed up with our pharmacist 03/2022. Blood pressure was better controlled but she struggled with polypharmacy.  Today the patient states that she has been feeling better from a cardiovascular standpoint. She goes to the gym three to four times a week and hasn't noticed any exercise induced symptoms. She has also improved her diet and has been eating more protein and fish, and she eats once or twice a day and minimizes her salt intake. She has been getting blood pressure at about 130's or 140's or 80's or 90's after taking her medication at noon, however before she uses her medication she has a blood pressure similar to that in clinic of 169/94 which was re-measured at 178/98. She has been halving her medication of Lisinopril and  Chlorthalidone. She had gone to the dentist and is still in pain from her dental procedure. She denies any palpitations, chest pain, shortness of breath, or peripheral edema. No lightheadedness, headaches, syncope, orthopnea, or PND.  Previous antihypertensives: Chlorthalidone Lisinopril (edema in mouth and lips)  Past Medical History:  Diagnosis Date   Chronic combined systolic and diastolic heart failure (HCC) 06/21/2022   Herpes    Hypertension    Migraine    Resistant hypertension 01/30/2022    Past Surgical History:  Procedure Laterality Date   CESAREAN SECTION     TUBAL LIGATION      Current Medications: Current Meds  Medication Sig   amLODipine (NORVASC) 10 MG tablet Take 1 tablet (10 mg total) by mouth at bedtime.   carvedilol (COREG) 25 MG tablet Take 12.5 mg by mouth 2 (two) times daily with a meal.   chlorthalidone (HYGROTON) 25 MG tablet Take 12.5 mg by mouth daily.   dapagliflozin propanediol (FARXIGA) 10 MG TABS tablet Take 1 tablet (10 mg total) by mouth daily.   hydrALAZINE (APRESOLINE) 100 MG tablet Take 1 tablet (100 mg total) by mouth every 8 (eight) hours.   isosorbide mononitrate (IMDUR) 30 MG 24 hr tablet Take 1 tablet (30 mg total) by mouth daily.   Multiple Vitamin (MULTIVITAMIN WITH MINERALS) TABS tablet Take 1 tablet by mouth daily.   spironolactone (ALDACTONE) 25 MG tablet Take 1 tablet (25 mg total) by mouth daily.   [DISCONTINUED] carvedilol (COREG) 25 MG tablet Take 1 tablet (25 mg total) by mouth 2 (two) times daily with a meal. (  Patient taking differently: Take 12.5 mg by mouth 2 (two) times daily with a meal.)   [DISCONTINUED] chlorthalidone (HYGROTON) 25 MG tablet Take 1 tablet (25 mg total) by mouth daily. (Patient taking differently: Take 12.5 mg by mouth daily.)   [DISCONTINUED] cloNIDine (CATAPRES) 0.2 MG tablet Take 1 tablet (0.2 mg total) by mouth 2 (two) times daily.     Allergies:   Lisinopril   Social History   Socioeconomic History    Marital status: Single    Spouse name: Not on file   Number of children: 3   Years of education: Not on file   Highest education level: High school graduate  Occupational History   Occupation: [employer]    Comment: Full time  Tobacco Use   Smoking status: Never   Smokeless tobacco: Never  Vaping Use   Vaping Use: Never used  Substance and Sexual Activity   Alcohol use: Yes    Alcohol/week: 4.0 standard drinks of alcohol    Types: 4 Glasses of wine per week    Comment: occ   Drug use: No   Sexual activity: Yes  Other Topics Concern   Not on file  Social History Narrative   Not on file   Social Determinants of Health   Financial Resource Strain: High Risk (02/14/2022)   Overall Financial Resource Strain (CARDIA)    Difficulty of Paying Living Expenses: Hard  Food Insecurity: No Food Insecurity (01/30/2022)   Hunger Vital Sign    Worried About Running Out of Food in the Last Year: Never true    Ran Out of Food in the Last Year: Never true  Transportation Needs: No Transportation Needs (01/30/2022)   PRAPARE - Administrator, Civil Service (Medical): No    Lack of Transportation (Non-Medical): No  Physical Activity: Not on file  Stress: Not on file  Social Connections: Socially Isolated (02/15/2022)   Social Connection and Isolation Panel [NHANES]    Frequency of Communication with Friends and Family: More than three times a week    Frequency of Social Gatherings with Friends and Family: Never    Attends Religious Services: Never    Database administrator or Organizations: No    Attends Engineer, structural: Never    Marital Status: Never married     Family History: The patient's family history includes Diabetes in her maternal aunt; HIV/AIDS in her father and mother; Hyperlipidemia in her maternal aunt; Hypertension in her maternal aunt.  ROS:   Please see the history of present illness.     All other systems reviewed and are  negative.  EKGs/Labs/Other Studies Reviewed:    Bilateral Renal Artery Doppler 01/30/22:  Summary:  Renal:    Right: No evidence of right renal artery stenosis. Abnormal size for         the right kidney. Abnormal right Resistive Index. RRV flow         present.  Left:  No evidence of left renal artery stenosis. Normal size of         left kidney. Abnormal left Resisitve Index. LRV flow present.  Mesenteric:  Normal Celiac artery findings. 70 to 99% stenosis in the superior  mesenteric  artery.   EKG:  The EKG is personally reviewed. 06/21/22: The EKG is not ordered  Recent Labs: 01/25/2022: B Natriuretic Peptide 1,003.2 01/29/2022: Hemoglobin 12.5; Platelets 242 02/02/2022: Magnesium 2.0 04/13/2022: ALT 14; BUN 28; Creatinine, Ser 1.66; Potassium 3.6; Sodium 137   Recent  Lipid Panel    Component Value Date/Time   CHOL 151 01/25/2022 1814   TRIG 75 01/25/2022 1814   HDL 36 (L) 01/25/2022 1814   CHOLHDL 4.2 01/25/2022 1814   VLDL 15 01/25/2022 1814   LDLCALC 100 (H) 01/25/2022 1814    Physical Exam:   VS:  BP (!) 178/98 (BP Location: Right Arm, Patient Position: Sitting, Cuff Size: Large)   Pulse 72   Ht 5\' 9"  (1.753 m)   Wt 238 lb 9.6 oz (108.2 kg)   SpO2 97%   BMI 35.24 kg/m  , BMI Body mass index is 35.24 kg/m. GENERAL:  Well appearing HEENT: Pupils equal round and reactive, fundi not visualized, oral mucosa unremarkable NECK:  No jugular venous distention, waveform within normal limits, carotid upstroke brisk and symmetric, no bruits, no thyromegaly LUNGS:  Clear to auscultation bilaterally HEART:  RRR.  PMI not displaced or sustained,S1 and S2 within normal limits, no S3, no S4, no clicks, no rubs, no murmurs ABD:  Flat, positive bowel sounds normal in frequency in pitch, no bruits, no rebound, no guarding, no midline pulsatile mass, no hepatomegaly, no splenomegaly EXT:  2 plus pulses throughout, no edema, no cyanosis no clubbing SKIN:  No rashes no  nodules NEURO:  Cranial nerves II through XII grossly intact, motor grossly intact throughout PSYCH:  Cognitively intact, oriented to person place and time   ASSESSMENT/PLAN:    Chronic combined systolic and diastolic heart failure (HCC) LVEF 40%.  This occurred in the setting of not being on any antihypertensive medication.  She has been on therapy for over 3 months and blood pressures have been generally better controlled though uncontrolled today.  Repeat echo.  She is euvolemic on exam.  Resistant hypertension Blood pressure remains uncontrolled as above.  She is due for her next dose of medicine now.  In general she notes that her blood pressures at this time of day are higher than later in the day.  We will have her increase her clonidine to 3 times daily instead of twice daily.  Continue with amlodipine, carvedilol, chlorthalidone, hydralazine, Imdur, and spironolactone.  Chlorthalidone was reduced due to worsening renal function.  We will check a BMP today.  Options for additional agents are limited.  She has Angioedema History with Lisinopril and Therefore Cannot Be on an ARB or Entresto.   Screening for Secondary Hypertension:     02/17/2022    4:54 PM  Causes  Drugs/Herbals Screened  Renovascular HTN Screened  Sleep Apnea Not Screened     - Comments sleep study once insurance obtained  Hyperaldosteronism Screened  Compliance Screened    Relevant Labs/Studies:    Latest Ref Rng & Units 04/13/2022    3:34 PM 02/14/2022    9:34 AM 02/02/2022    1:39 AM  Basic Labs  Sodium 134 - 144 mmol/L 137  136  134   Potassium 3.5 - 5.2 mmol/L 3.6  3.6  3.6   Creatinine 0.57 - 1.00 mg/dL 02/04/2022  7.65  4.65        Latest Ref Rng & Units 11/11/2019    8:08 AM 11/10/2019    1:14 PM  Thyroid   TSH 0.350 - 4.500 uIU/mL 2.975  1.709        Latest Ref Rng & Units 01/25/2022    6:14 PM 11/14/2019    7:41 AM 11/11/2019    7:46 AM  Renin/Aldosterone   Aldosterone 0.0 - 30.0 ng/dL 5.0  11/13/2019  16.0  01/30/2022    4:09 PM  Renovascular   Renal Artery [place] Completed Yes     Disposition:    FU with Gillian Shields, NP in 1 month   Medication Adjustments/Labs and Tests Ordered: Current medicines are reviewed at length with the patient today.  Concerns regarding medicines are outlined above.  Orders Placed This Encounter  Procedures   TSH   Catecholamines, fractionated, plasma   Metanephrines, plasma   Basic metabolic panel   Meds ordered this encounter  Medications   cloNIDine (CATAPRES) 0.2 MG tablet    Sig: Take 1 tablet (0.2 mg total) by mouth 3 (three) times daily.    Dispense:  270 tablet    Refill:  3    D/C TWICE A DAY RX   I,Coren O'Brien,acting as a scribe for Chilton Si, MD.,have documented all relevant documentation on the behalf of Chilton Si, MD,as directed by  Chilton Si, MD while in the presence of Chilton Si, MD.  I, Tieasha Larsen C. Duke Salvia, MD have reviewed all documentation for this visit.  The documentation of the exam, diagnosis, procedures, and orders on 06/21/2022 are all accurate and complete.   Signed, Chilton Si, MD  06/21/2022 12:43 PM    [facility]

## 2022-06-21 NOTE — Research (Signed)
I saw pt today after Dr. Houston's follow up visit. Pt is in Dr. [place]'s Virtual Care HTN Study. Pt filled out research survey. Pt was enrolled in Group 1.   

## 2022-06-21 NOTE — Assessment & Plan Note (Signed)
LVEF 40%.  This occurred in the setting of not being on any antihypertensive medication.  She has been on therapy for over 3 months and blood pressures have been generally better controlled though uncontrolled today.  Repeat echo.  She is euvolemic on exam.

## 2022-06-21 NOTE — Assessment & Plan Note (Signed)
Blood pressure remains uncontrolled as above.  She is due for her next dose of medicine now.  In general she notes that her blood pressures at this time of day are higher than later in the day.  We will have her increase her clonidine to 3 times daily instead of twice daily.  Continue with amlodipine, carvedilol, chlorthalidone, hydralazine, Imdur, and spironolactone.  Chlorthalidone was reduced due to worsening renal function.  We will check a BMP today.  Options for additional agents are limited.  She has Angioedema History with Lisinopril and Therefore Cannot Be on an ARB or Entresto.

## 2022-06-25 ENCOUNTER — Other Ambulatory Visit: Payer: Self-pay

## 2022-07-02 ENCOUNTER — Other Ambulatory Visit: Payer: Self-pay

## 2022-07-03 ENCOUNTER — Other Ambulatory Visit (HOSPITAL_BASED_OUTPATIENT_CLINIC_OR_DEPARTMENT_OTHER): Payer: Self-pay | Admitting: *Deleted

## 2022-07-03 DIAGNOSIS — I5042 Chronic combined systolic (congestive) and diastolic (congestive) heart failure: Secondary | ICD-10-CM

## 2022-07-04 ENCOUNTER — Telehealth: Payer: Self-pay | Admitting: Licensed Clinical Social Worker

## 2022-07-04 NOTE — Telephone Encounter (Signed)
H&V Care Navigation CSW Progress Note  Clinical Social Worker contacted patient by phone to f/u on mailed assistance applications. No answer at [phone], voicemail left requesting call back- will reattempt as able.   Patient is participating in a Managed Medicaid Plan:  No, self pay only.   SDOH Screenings   Food Insecurity: No Food Insecurity (01/30/2022)  Housing: Low Risk  (01/30/2022)  Transportation Needs: No Transportation Needs (01/30/2022)  Alcohol Screen: Low Risk  (01/30/2022)  Depression (PHQ2-9): High Risk (02/07/2022)  Financial Resource Strain: High Risk (02/14/2022)  Social Connections: Socially Isolated (02/15/2022)  Tobacco Use: Low Risk  (06/21/2022)   [name], MSW, LCSW Clinical Social Worker II [facility] Heart/Vascular Care Navigation  [phone]- work cell phone (preferred) [phone]- desk phone

## 2022-07-06 LAB — BASIC METABOLIC PANEL
BUN/Creatinine Ratio: 17 (ref 9–23)
BUN: 29 mg/dL — ABNORMAL HIGH (ref 6–24)
CO2: 26 mmol/L (ref 20–29)
Calcium: 10.5 mg/dL — ABNORMAL HIGH (ref 8.7–10.2)
Chloride: 98 mmol/L (ref 96–106)
Creatinine, Ser: 1.66 mg/dL — ABNORMAL HIGH (ref 0.57–1.00)
Glucose: 106 mg/dL — ABNORMAL HIGH (ref 70–99)
Potassium: 4.3 mmol/L (ref 3.5–5.2)
Sodium: 137 mmol/L (ref 134–144)
eGFR: 39 mL/min/{1.73_m2} — ABNORMAL LOW (ref >59)

## 2022-07-06 LAB — TSH: TSH: 1.47 u[IU]/mL (ref 0.450–4.500)

## 2022-07-06 LAB — CATECHOLAMINES, FRACTIONATED, PLASMA
Dopamine: <30 pg/mL (ref 0–48)
Epinephrine: 51 pg/mL (ref 0–62)
Norepinephrine: 349 pg/mL (ref 0–874)

## 2022-07-06 LAB — METANEPHRINES, PLASMA
Metanephrine, Free: <25 pg/mL (ref 0.0–88.0)
Normetanephrine, Free: 32.1 pg/mL (ref 0.0–218.9)

## 2022-07-11 ENCOUNTER — Ambulatory Visit (INDEPENDENT_AMBULATORY_CARE_PROVIDER_SITE_OTHER): Payer: Self-pay

## 2022-07-11 DIAGNOSIS — I5042 Chronic combined systolic (congestive) and diastolic (congestive) heart failure: Secondary | ICD-10-CM

## 2022-07-11 LAB — ECHOCARDIOGRAM COMPLETE
AR max vel: 2.09 cm2
AV Area VTI: 2.16 cm2
AV Area mean vel: 2.04 cm2
AV Mean grad: 9 mmHg
AV Peak grad: 17.6 mmHg
Ao pk vel: 2.1 m/s
Area-P 1/2: 3.12 cm2
S' Lateral: 2.55 cm

## 2022-07-12 ENCOUNTER — Telehealth: Payer: Self-pay | Admitting: Licensed Clinical Social Worker

## 2022-07-12 NOTE — Telephone Encounter (Signed)
H&V Care Navigation CSW Progress Note  Clinical Social Worker contacted patient by phone to f/u on mailed assistance applications. No answer again at [phone], voicemail left requesting call back- will reattempt as able.    Patient is participating in a Managed Medicaid Plan:  No, self pay only  SDOH Screenings   Food Insecurity: No Food Insecurity (01/30/2022)  Housing: Low Risk  (01/30/2022)  Transportation Needs: No Transportation Needs (01/30/2022)  Alcohol Screen: Low Risk  (01/30/2022)  Depression (PHQ2-9): High Risk (02/07/2022)  Financial Resource Strain: High Risk (02/14/2022)  Social Connections: Socially Isolated (02/15/2022)  Tobacco Use: Low Risk  (06/21/2022)    [name], MSW, LCSW Clinical Social Worker II [facility] Heart/Vascular Care Navigation  [phone]- work cell phone (preferred) [phone]- desk phone

## 2022-07-20 ENCOUNTER — Other Ambulatory Visit: Payer: Self-pay

## 2022-07-20 ENCOUNTER — Other Ambulatory Visit (HOSPITAL_BASED_OUTPATIENT_CLINIC_OR_DEPARTMENT_OTHER): Payer: Self-pay

## 2022-07-20 DIAGNOSIS — I5042 Chronic combined systolic (congestive) and diastolic (congestive) heart failure: Secondary | ICD-10-CM

## 2022-07-20 DIAGNOSIS — I77810 Thoracic aortic ectasia: Secondary | ICD-10-CM

## 2022-07-23 ENCOUNTER — Other Ambulatory Visit: Payer: Self-pay

## 2022-07-25 ENCOUNTER — Telehealth: Payer: Self-pay | Admitting: Licensed Clinical Social Worker

## 2022-07-25 ENCOUNTER — Encounter (HOSPITAL_BASED_OUTPATIENT_CLINIC_OR_DEPARTMENT_OTHER): Payer: Self-pay | Admitting: Pharmacist Clinician (PhC)/ Clinical Pharmacy Specialist

## 2022-07-25 ENCOUNTER — Ambulatory Visit (INDEPENDENT_AMBULATORY_CARE_PROVIDER_SITE_OTHER): Payer: Self-pay | Admitting: Pharmacist Clinician (PhC)/ Clinical Pharmacy Specialist

## 2022-07-25 VITALS — BP 149/89 | HR 59

## 2022-07-25 DIAGNOSIS — I1A0 Resistant hypertension: Secondary | ICD-10-CM

## 2022-07-25 MED ORDER — CLONIDINE 0.3 MG/24HR TD PTWK
0.3000 mg | MEDICATED_PATCH | TRANSDERMAL | 12 refills | Status: DC
  Filled 2022-07-25: qty 4, 28d supply, fill #0
  Filled 2022-08-23 – 2022-08-24 (×2): qty 4, 28d supply, fill #1

## 2022-07-25 NOTE — Assessment & Plan Note (Signed)
Assessment: BP is un/controlled in office BP 149/59 mmHg above the goal (<130/80). Will be uninsured until February 2024 Tolerates current medications well without any side effects  Denies SOB, palpitation, chest pain, headaches,or swelling Reiterated the importance of regular exercise and low salt diet   Plan:  Start taking clonidine 0.3 mg patch once weekly Stop taking clonidine 0.2 mg tid Continue taking all other medications Patient to keep record of BP readings with heart rate and report to [place] at the next visit Patient to see PharmD in March for follow up - will reach out to patient once schedule available Follow up lab(s) - none If clonidine patches not available through [facility],

## 2022-07-25 NOTE — Telephone Encounter (Signed)
H&V Care Navigation CSW Progress Note  Clinical Social Worker  spoke with Robin Jarvis  to f/u after appt w/ pt today. Pt shared with her that she will have employer based insurance starting 08/13/22. Not interested in completing assistance applications at this time. I remain available if needed, will not actively follow at this time.   Patient is participating in a Managed Medicaid Plan:  No, self pay only.   SDOH Screenings   Food Insecurity: No Food Insecurity (01/30/2022)  Housing: Low Risk  (01/30/2022)  Transportation Needs: No Transportation Needs (01/30/2022)  Alcohol Screen: Low Risk  (01/30/2022)  Depression (PHQ2-9): High Risk (02/07/2022)  Financial Resource Strain: High Risk (02/14/2022)  Social Connections: Socially Isolated (02/15/2022)  Tobacco Use: Low Risk  (06/21/2022)   [name], MSW, LCSW Clinical Social Worker II [facility] Heart/Vascular Care Navigation  [phone]- work cell phone (preferred) [phone]- desk phone

## 2022-07-25 NOTE — Progress Notes (Signed)
Office Visit    Patient Name: Robin Jarvis Date of Encounter: 07/25/2022  Primary Care Provider:  Claiborne Rigg, NP Primary Cardiologist:  Armanda Magic, MD  Chief Complaint    Hypertension - Advanced hypertension clinic  Past Medical History   CHF 6/23 HFrEF at 40%, on dapagliflozin, carvedilol, spironolactone, 11/23 echo showed EF improved to 70-75%    Allergies  Allergen Reactions   Lisinopril Swelling    Discussed with patient 01/31/2022 more information about reaction to lisinopril. Patient transitioned off ?labetalol used during pregnancy to lisinopril post-partum and developed lip swelling, which was noticed in office visit and medication was stopped. Patient never developed SOB or trouble breathing and did not need to go to the hospital. She is amendable to trying an ARB if done inpatient.      History of Present Illness    Robin Jarvis is a 46 y.o. female patient  of Dr Mayford Knife, who presents today for advanced hypertension clinic follow up.  Other history significant for CHF (EF 40%).  She was referred to the [facility] after hospital admission for hypertensive emergency.   She had been dealing with non-compliance secondary to financial constraints.  Unfortunately she had cancelled her health insurance (through employer) earlier this year due to high costs.  She is feeling much better now, although has some frustration with the number of medications for hypertension and the lack of consistently good readings.  Patient reports that she had hypertension during all 3 pregnancies (20+ years ago), but after the third her pressure never seemed to go back down.  When she saw Robin Jarvis her pressure was 142/91.  No changes were made, as the amlodipine had just been started the day prior.  At her last visit with me in August we tried to simplify her medication regimen.  She works a second shift job and was having trouble figuring out best time to take meds.  Since then she saw her PCP and it  was noted her SCr had bumped up to 1.66 (had ben as low as 1.09 in June, but increasing since then).  They asked her to cut chlorthalidone tablet to 12.5 mg daily.     Since I saw her in September, she was seen by Dr. Duke Salvia early last month.   Pressure was still elevated at 178/98, but patient noted ongoing pain after a dental visit.  Dr. Duke Salvia decreased chlorthalidone because of worsening renal function, and increased clonidine to tid.  Social worker has been trying to reach out to patient, who is self pay.  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  Insurance with this job will begin at the first of February.  She currently gets [facility] from [facility] and [facility]    Blood Pressure Goal:  130/80  Current Medications: amlodipine 10 mg qd (8:30 pm), carvedilol 12.5 mg bid (noon-1:30 am), chlorthalidone 12.5 mg qd(noon) , clonidine 0.2 mg tid, hydralazine 100 mg tid, spironolactone 25 mg qd (noon)  Previously tried:   ACEI - angioedema (do not challenge with ARB/ARNI)  Family Hx: both parents deceased AIDS; maternal aunt had hypertension; 4 siblings healthy; 3 kids, daughter with hypertension (6'3", 200+ lbs)   Social Hx: no tobacco, occasional glass of wine; no regular caffeine - stopped coffee; no soda   Diet: loves to cook, uses appropriate seasonings, no salt; lots of fruits/vegetables, greek low fat yogurt; melons; protein is mostly chicken, ground [place],  occasional beans   Exercise: 30 minute walks at least 4 times per week, brisk pace; now going to gym - does 1 hour on elliptical 3 days per week  Home BP readings:  home readings 130-150's/70-80's   Accessory Clinical Findings    Lab Results  Component Value Date   CREATININE 1.66 (H) 06/25/2022   BUN 29 (H) 06/25/2022   NA 137 06/25/2022   K 4.3 06/25/2022   CL 98 06/25/2022   CO2 26 06/25/2022   Lab Results  Component Value Date   ALT 14  04/13/2022   AST 15 04/13/2022   ALKPHOS 63 04/13/2022   BILITOT 0.3 04/13/2022   Lab Results  Component Value Date   HGBA1C 5.5 01/25/2022    Home Medications    Current Outpatient Medications  Medication Sig Dispense Refill   amLODipine (NORVASC) 10 MG tablet Take 1 tablet (10 mg total) by mouth at bedtime. 30 tablet 11   carvedilol (COREG) 25 MG tablet Take 12.5 mg by mouth 2 (two) times daily with a meal.     chlorthalidone (HYGROTON) 25 MG tablet Take 12.5 mg by mouth daily.     cloNIDine (CATAPRES - DOSED IN MG/24 HR) 0.3 mg/24hr patch Place 1 patch (0.3 mg total) onto the skin once a week. 4 patch 12   dapagliflozin propanediol (FARXIGA) 10 MG TABS tablet Take 1 tablet (10 mg total) by mouth daily. 30 tablet 3   hydrALAZINE (APRESOLINE) 100 MG tablet Take 1 tablet (100 mg total) by mouth every 8 (eight) hours. 90 tablet 9   isosorbide mononitrate (IMDUR) 30 MG 24 hr tablet Take 1 tablet (30 mg total) by mouth daily. 30 tablet 9   Multiple Vitamin (MULTIVITAMIN WITH MINERALS) TABS tablet Take 1 tablet by mouth daily.     spironolactone (ALDACTONE) 25 MG tablet Take 1 tablet (25 mg total) by mouth daily. 30 tablet 9   No current facility-administered medications for this visit.    Screening for Secondary Hypertension: { Click here to document screening for secondary causes of HTN  :1}     02/17/2022    4:54 PM  Causes  Drugs/Herbals Screened  Renovascular HTN Screened  Sleep Apnea Not Screened     - Comments sleep study once insurance obtained  Hyperaldosteronism Screened  Compliance Screened    Relevant Labs/Studies:    Latest Ref Rng & Units 06/25/2022    8:47 AM 04/13/2022    3:34 PM 02/14/2022    9:34 AM  Basic Labs  Sodium 134 - 144 mmol/L 137  137  136   Potassium 3.5 - 5.2 mmol/L 4.3  3.6  3.6   Creatinine 0.57 - 1.00 mg/dL 0.93  2.67  1.24        Latest Ref Rng & Units 06/25/2022    8:47 AM 11/11/2019    8:08 AM  Thyroid   TSH 0.450 - 4.500 uIU/mL 1.470   2.975        Latest Ref Rng & Units 01/25/2022    6:14 PM 11/14/2019    7:41 AM 11/11/2019    7:46 AM  Renin/Aldosterone   Aldosterone 0.0 - 30.0 ng/dL 5.0  58.0  99.8        Latest Ref Rng & Units 06/25/2022    8:47 AM  Metanephrines/Catecholamines   Epinephrine 0 - 62 pg/mL 51   Norepinephrine 0 - 874 pg/mL 349   Dopamine 0 - 48 pg/mL <30   Metanephrines 0.0 - 88.0 pg/mL <25.0  Normetanephrines  0.0 - 218.9 pg/mL 32.1           01/30/2022    4:09 PM  Renovascular   Renal Artery [place] Completed Yes      Assessment & Plan    HYPERTENSION CONTROL Vitals:   07/25/22 1052 07/25/22 1058  BP: (!) 159/82 (!) 149/89    The patient's blood pressure is elevated above target today. {Click here if intervention needs to be changed Refresh Note :1}  In order to address the patient's elevated BP: A current anti-hypertensive medication was adjusted today.     Resistant hypertension Assessment: BP is un/controlled in office BP 149/59 mmHg above the goal (<130/80). Will be uninsured until February 2024 Tolerates current medications well without any side effects  Denies SOB, palpitation, chest pain, headaches,or swelling Reiterated the importance of regular exercise and low salt diet   Plan:  Start taking clonidine 0.3 mg patch once weekly Stop taking clonidine 0.2 mg tid Continue taking all other medications Patient to keep record of BP readings with heart rate and report to [place] at the next visit Patient to see PharmD in March for follow up - will reach out to patient once schedule available Follow up lab(s) - none If clonidine patches not available through [facility],    Phillips Hay PharmD CPP [facility]  [address] [place], [place] [address] [phone]

## 2022-07-25 NOTE — Patient Instructions (Addendum)
We will call you to schedule a follow up appointment for March   Take your BP meds as follows:  Let's see if the pharmacy can get clonidine 0.3 mg patches.  If so, apply 1 patch each week.  Put next patch on a different place on the torso/arm.  If you can't get those, continue with the clonidine 0.2 mg three times daily.      9 am  - chlorthalidone 12.5 mg, carvedilol 12.5 mg, hydralazine 100 mg, clonidine 0.2 mg  1-2 pm - spironolactone 25 mg, hydralazine 100 mg, clonidine 0.2 mg  9 pm  -  amlodipine 10 mg, carvedilol 12.5 mg, hydralazine 100 mg, clonidine 0.2 mg   Check your blood pressure at home daily (if able) and keep record of the readings.  Hypertension "High blood pressure"  Hypertension is often called "The Silent Killer." It rarely causes symptoms until it is extremely  high or has done damage to other organs in the body. For this reason, you should have your  blood pressure checked regularly by your physician. We will check your blood pressure  every time you see a provider at one of our offices.   Your blood pressure reading consists of two numbers. Ideally, blood pressure should be  below 120/80. The first ("top") number is called the systolic pressure. It measures the  pressure in your arteries as your heart beats. The second ("bottom") number is called the diastolic pressure. It measures the pressure in your arteries as the heart relaxes between beats.  The benefits of getting your blood pressure under control are enormous. A 10-point  reduction in systolic blood pressure can reduce your risk of stroke by 27% and heart failure by 28%  Your blood pressure goal is <130/80  To check your pressure at home you will need to:  1. Sit up in a chair, with feet flat on the floor and back supported. Do not cross your ankles or legs. 2. Rest your left arm so that the cuff is about heart level. If the cuff goes on your upper arm,  then just relax the arm on the table, arm of  the chair or your lap. If you have a wrist cuff, we  suggest relaxing your wrist against your chest (think of it as Pledging the Flag with the  wrong arm).  3. Place the cuff snugly around your arm, about 1 inch above the crook of your elbow. The  cords should be inside the groove of your elbow.  4. Sit quietly, with the cuff in place, for about 5 minutes. After that 5 minutes press the power  button to start a reading. 5. Do not talk or move while the reading is taking place.  6. Record your readings on a sheet of paper. Although most cuffs have a memory, it is often  easier to see a pattern developing when the numbers are all in front of you.  7. You can repeat the reading after 1-3 minutes if it is recommended  Make sure your bladder is empty and you have not had caffeine or tobacco within the last 30 min  Always bring your blood pressure log with you to your appointments. If you have not brought your monitor in to be double checked for accuracy, please bring it to your next appointment.  You can find a list of quality blood pressure cuffs at validatebp.org

## 2022-07-26 ENCOUNTER — Other Ambulatory Visit: Payer: Self-pay

## 2022-07-27 ENCOUNTER — Other Ambulatory Visit: Payer: Self-pay

## 2022-07-31 ENCOUNTER — Other Ambulatory Visit: Payer: Self-pay

## 2022-08-03 ENCOUNTER — Other Ambulatory Visit: Payer: Self-pay

## 2022-08-16 ENCOUNTER — Telehealth (HOSPITAL_BASED_OUTPATIENT_CLINIC_OR_DEPARTMENT_OTHER): Payer: Self-pay

## 2022-08-16 NOTE — Telephone Encounter (Signed)
error 

## 2022-08-24 ENCOUNTER — Other Ambulatory Visit: Payer: Self-pay

## 2022-08-26 NOTE — Telephone Encounter (Signed)
Addressed in [place] with PharmD.  Loel Dubonnet, NP

## 2022-08-30 ENCOUNTER — Other Ambulatory Visit: Payer: Self-pay

## 2022-09-03 ENCOUNTER — Other Ambulatory Visit: Payer: Self-pay

## 2022-09-04 ENCOUNTER — Encounter (HOSPITAL_BASED_OUTPATIENT_CLINIC_OR_DEPARTMENT_OTHER): Payer: Self-pay | Admitting: Pharmacist Clinician (PhC)/ Clinical Pharmacy Specialist

## 2022-09-09 MED ORDER — CLONIDINE HCL 0.2 MG PO TABS: 0.2000 mg | ORAL_TABLET | Freq: Three times a day (TID) | ORAL | 3 refills | Status: DC

## 2022-09-10 ENCOUNTER — Other Ambulatory Visit: Payer: Self-pay

## 2022-09-10 MED ORDER — CLONIDINE HCL 0.2 MG PO TABS
0.2000 mg | ORAL_TABLET | Freq: Three times a day (TID) | ORAL | 3 refills | Status: DC
  Filled 2022-09-10: qty 90, 30d supply, fill #0
  Filled 2022-10-15 (×2): qty 90, 30d supply, fill #1
  Filled 2022-11-20 – 2022-11-29 (×2): qty 90, 30d supply, fill #2
  Filled 2022-12-27 (×2): qty 90, 30d supply, fill #3

## 2022-09-10 NOTE — Addendum Note (Signed)
Addended by: Rockne Menghini on: 09/10/2022 07:51 AM   Modules accepted: Orders

## 2022-09-21 ENCOUNTER — Other Ambulatory Visit: Payer: Self-pay | Admitting: Nurse Practitioner

## 2022-09-21 MED ORDER — CARVEDILOL 25 MG PO TABS
12.5000 mg | ORAL_TABLET | Freq: Two times a day (BID) | ORAL | 2 refills | Status: DC
  Filled 2022-09-21: qty 90, 90d supply, fill #0
  Filled 2022-12-27: qty 30, 30d supply, fill #1
  Filled 2022-12-27: qty 90, 90d supply, fill #1
  Filled 2023-01-21: qty 30, 30d supply, fill #2
  Filled 2023-03-07: qty 30, 30d supply, fill #3

## 2022-09-24 ENCOUNTER — Other Ambulatory Visit: Payer: Self-pay

## 2022-09-25 ENCOUNTER — Other Ambulatory Visit: Payer: Self-pay | Admitting: Nurse Practitioner

## 2022-09-25 MED ORDER — CHLORTHALIDONE 25 MG PO TABS
12.5000 mg | ORAL_TABLET | Freq: Every day | ORAL | 0 refills | Status: DC
  Filled 2022-09-25: qty 15, 30d supply, fill #0

## 2022-09-25 NOTE — Telephone Encounter (Signed)
Needs lab visIT

## 2022-09-26 ENCOUNTER — Other Ambulatory Visit: Payer: Self-pay

## 2022-09-27 ENCOUNTER — Other Ambulatory Visit: Payer: Self-pay

## 2022-10-02 ENCOUNTER — Telehealth: Payer: Self-pay

## 2022-10-02 NOTE — Telephone Encounter (Signed)
Patient attempted to be outreached by Donney Rankins, PharmD Candidate on 10/02/2022 to discuss hypertension. Left voicemail for patient to return our call at their convenience at [phone].   [place] [facility]  PharmD Candidate 2024   Maryan Puls, PharmD PGY-1 [facility] Pharmacy Resident

## 2022-10-04 NOTE — Telephone Encounter (Signed)
Patient attempted to be outreached by Junius Finner, PharmD Candidate on 10/04/2022 to discuss hypertension. Left voicemail for patient to return our call at their convenience at [phone].   [place] [facility]  PharmD Candidate 2024   Maryan Puls, PharmD PGY-1 [facility] Pharmacy Resident

## 2022-10-15 ENCOUNTER — Other Ambulatory Visit: Payer: Self-pay

## 2022-10-17 IMAGING — CT CT HEART MORP W/ CTA COR W/ SCORE W/ CA W/CM &/OR W/O CM
4 of 7 series · 8 of 20 positions shown, 9 images · IV contrast (APPLIED)
Comparison: None Available.
COMPARISON: None Available.

Addendum:
EXAM:
OVER-READ INTERPRETATION  CT CHEST

The following report is a limited chest CT over-read performed by
01/29/2022. The CTA interpretation by the cardiologist is attached.
HISTORY: 45 yo female, non-ischemic cardiomyopathy suspected
Cardiac/Coronary CTA
TECHNIQUE: The patient was scanned on a Siemens Force scanner.
PROTOCOL: A 90 kV prospective scan was triggered in the descending thoracic
aorta at 111 HU's. Axial non-contrast 3 mm slices were carried out
through the heart. The data set was analyzed on a dedicated work
station and scored using the Agatson method. Gantry rotation speed
was 250 msecs and collimation was .6 mm. Beta blockade and 0.8 mg of
sl NTG was given. The 3D data set was reconstructed in 5% intervals
of the 35-75 % of the R-R cycle. Diastolic phases were analyzed on a
dedicated work station using MPR, MIP and VRT modes. The patient
received 90mL OMNIPAQUE IOHEXOL 350 MG/ML SOLN of contrast.

[Series 6: ts diast sharp · axial · 0.39mm/px · z∈[-186,-143]mm · 2 of 321 slices shown]
[im 107/321  lung]
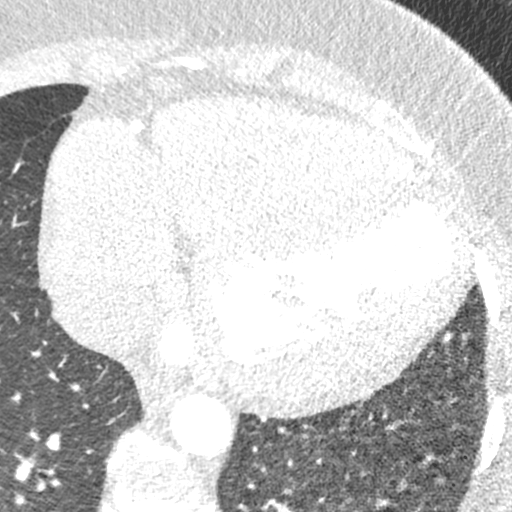
[im 214/321  lung]
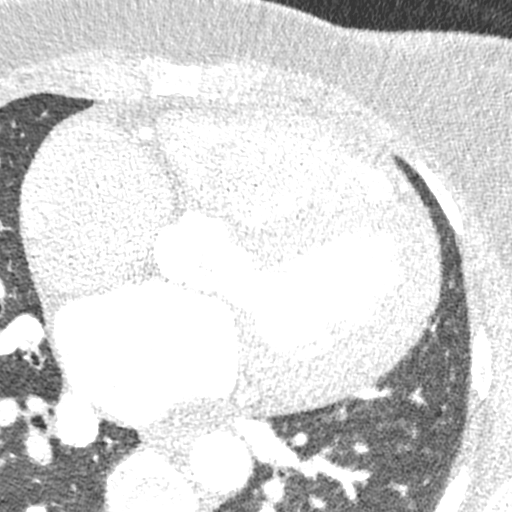

[Series 7: ts syst sharp · axial · 0.44mm/px · z∈[-185,-143]mm · 2 of 320 slices shown]
[im 107/320  lung]
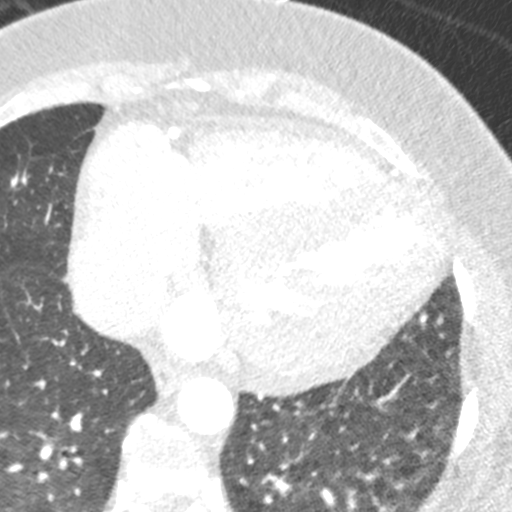
[im 213/320  lung]
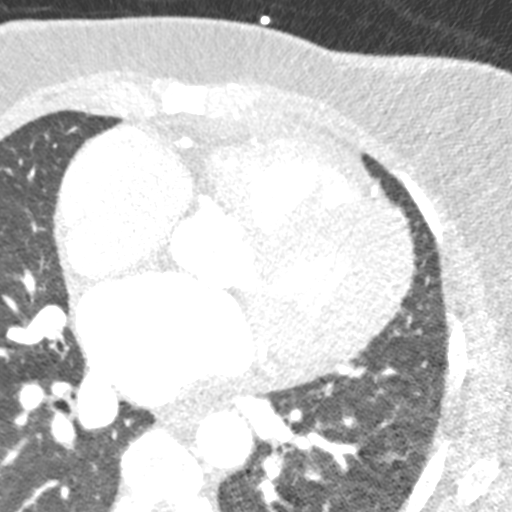

[Series 8: best diast · axial · 0.44mm/px · z∈[-185,-143]mm · 2 of 320 slices shown, 3 images]
[im 107/320  vessel]
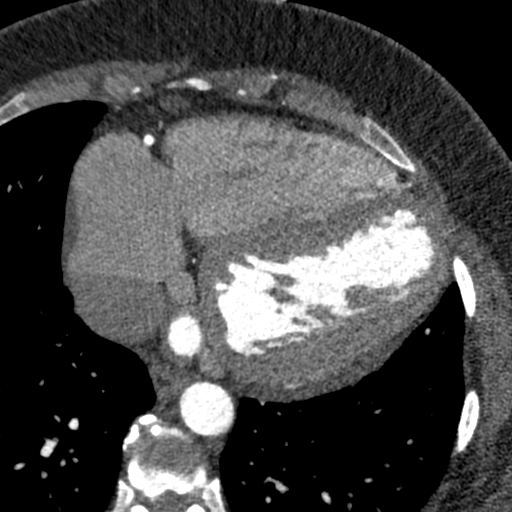
[im 107/320  lung]
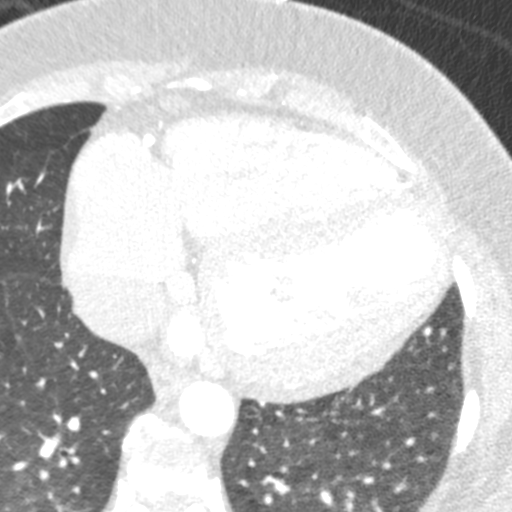
[im 213/320  vessel]
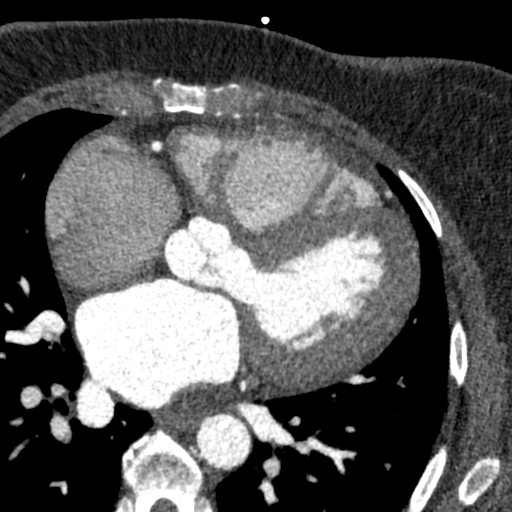

[Series 9: best syst · axial · 0.44mm/px · z∈[-185,-143]mm · 2 of 320 slices shown]
[im 107/320  vessel]
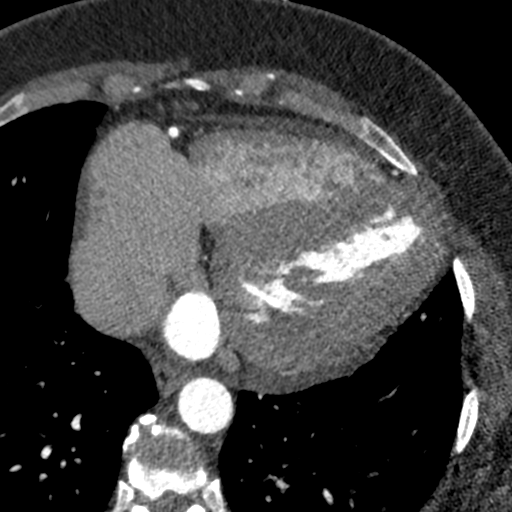
[im 213/320  vessel]
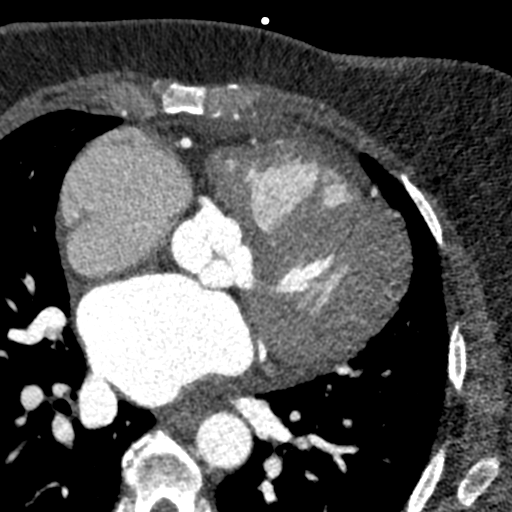

[8 of 20 positions shown; findings below may reference images not displayed]

FINDINGS: Limited view of the lung parenchyma demonstrates no suspicious
nodularity. Airways are normal.

Limited view of the mediastinum demonstrates no adenopathy.
Esophagus normal.

Limited view of the upper abdomen unremarkable.

Limited view of the skeleton and chest wall is unremarkable.
IMPRESSION: No significant extracardiac findings.
FINDINGS: Quality: Good, attenuation artifact, HR 60

Coronary calcium score: The patient's coronary artery calcium score
is 0, which places the patient in the 0 percentile.

Coronary arteries: Normal coronary origins.  Right dominance.

Right Coronary Artery: Dominant.  Normal vessel.

Left Main Coronary Artery: Normal. Bifurcates into the LAD and LCx
arteries.

Left Anterior Descending Coronary Artery: Anterior artery that
reaches the apex. No disease. D1 and D2 branches without disease.

Left Circumflex Artery: Proximal to mid vessel without disease. The
distal vessel is poorly visualized.

Aorta: Normal size, 35 mm at the mid ascending aorta (level of the
PA bifurcation) measured double oblique. No calcifications. No
dissection.

Aortic Valve: Trileaflet. No calcifications.

Other findings:

Normal pulmonary vein drainage into the left atrium.

Normal left atrial appendage without a thrombus.

Dilated main pulmonary artery at 37 mm, suggestive of pulmonary
hypertension.
IMPRESSION: 1. No evidence of CAD, CADRADS = 0.

2. Coronary calcium score of 0. This was 0 percentile for age and
sex matched control.

3. Normal coronary origin with right dominance.

4. Dilated main pulmonary artery at 37 mm, suggestive of pulmonary
hypertension.

*** End of Addendum ***
EXAM:
OVER-READ INTERPRETATION  CT CHEST

The following report is a limited chest CT over-read performed by
01/29/2022. The CTA interpretation by the cardiologist is attached.
FINDINGS: Limited view of the lung parenchyma demonstrates no suspicious
nodularity. Airways are normal.

Limited view of the mediastinum demonstrates no adenopathy.
Esophagus normal.

Limited view of the upper abdomen unremarkable.

Limited view of the skeleton and chest wall is unremarkable.
IMPRESSION: No significant extracardiac findings.

## 2022-10-19 ENCOUNTER — Other Ambulatory Visit: Payer: Self-pay

## 2022-11-20 ENCOUNTER — Other Ambulatory Visit: Payer: Self-pay

## 2022-11-20 ENCOUNTER — Other Ambulatory Visit: Payer: Self-pay | Admitting: Nurse Practitioner

## 2022-11-20 MED ORDER — CHLORTHALIDONE 25 MG PO TABS
12.5000 mg | ORAL_TABLET | Freq: Every day | ORAL | 0 refills | Status: DC
  Filled 2022-11-20: qty 15, 30d supply, fill #0

## 2022-11-21 ENCOUNTER — Other Ambulatory Visit: Payer: Self-pay

## 2022-11-26 ENCOUNTER — Other Ambulatory Visit: Payer: Self-pay

## 2022-11-29 ENCOUNTER — Other Ambulatory Visit: Payer: Self-pay

## 2022-12-27 ENCOUNTER — Other Ambulatory Visit: Payer: Self-pay | Admitting: Family Medicine

## 2022-12-27 ENCOUNTER — Other Ambulatory Visit: Payer: Self-pay

## 2022-12-28 ENCOUNTER — Other Ambulatory Visit: Payer: Self-pay

## 2022-12-31 ENCOUNTER — Other Ambulatory Visit: Payer: Self-pay

## 2023-01-21 ENCOUNTER — Other Ambulatory Visit: Payer: Self-pay

## 2023-01-21 ENCOUNTER — Other Ambulatory Visit (HOSPITAL_BASED_OUTPATIENT_CLINIC_OR_DEPARTMENT_OTHER): Payer: Self-pay | Admitting: Cardiology

## 2023-01-21 ENCOUNTER — Other Ambulatory Visit: Payer: Self-pay | Admitting: Family Medicine

## 2023-01-22 ENCOUNTER — Other Ambulatory Visit: Payer: Self-pay

## 2023-01-24 ENCOUNTER — Other Ambulatory Visit: Payer: Self-pay

## 2023-03-07 ENCOUNTER — Other Ambulatory Visit: Payer: Self-pay

## 2023-03-07 ENCOUNTER — Other Ambulatory Visit (HOSPITAL_BASED_OUTPATIENT_CLINIC_OR_DEPARTMENT_OTHER): Payer: Self-pay | Admitting: Cardiology

## 2023-03-07 ENCOUNTER — Other Ambulatory Visit: Payer: Self-pay | Admitting: Family Medicine

## 2023-03-07 MED ORDER — CLONIDINE HCL 0.2 MG PO TABS
0.2000 mg | ORAL_TABLET | Freq: Three times a day (TID) | ORAL | 1 refills | Status: DC
  Filled 2023-03-07: qty 270, 90d supply, fill #0
  Filled 2023-04-16 – 2023-09-20 (×4): qty 270, 90d supply, fill #1

## 2023-03-07 NOTE — Telephone Encounter (Signed)
Requested medication (s) are due for refill today: yes  Requested medication (s) are on the active medication list: yes  Last refill:  11/20/22  Future visit scheduled: no  Notes to clinic:  Unable to refill per protocol, courtesy refill already given, routing for provider approval.      Requested Prescriptions  Pending Prescriptions Disp Refills   chlorthalidone (HYGROTON) 25 MG tablet 15 tablet 0    Sig: Take 0.5 tablets (12.5 mg total) by mouth daily.     Cardiovascular: Diuretics - Thiazide Failed - 03/07/2023  9:34 AM      Failed - Cr in normal range and within 180 days    Creatinine, Ser  Date Value Ref Range Status  06/25/2022 1.66 (H) 0.57 - 1.00 mg/dL Final         Failed - K in normal range and within 180 days    Potassium  Date Value Ref Range Status  06/25/2022 4.3 3.5 - 5.2 mmol/L Final         Failed - Na in normal range and within 180 days    Sodium  Date Value Ref Range Status  06/25/2022 137 134 - 144 mmol/L Final         Failed - Last BP in normal range    BP Readings from Last 1 Encounters:  07/25/22 (!) 149/89         Failed - Valid encounter within last 6 months    Recent Outpatient Visits           10 months ago Encounter to establish care   [facility] [place], Shea Stakes, NP   1 year ago Secondary hypertension   [facility], [place], [place]

## 2023-03-08 ENCOUNTER — Encounter: Payer: Self-pay | Admitting: Pharmacist

## 2023-03-08 ENCOUNTER — Other Ambulatory Visit: Payer: Self-pay

## 2023-04-16 ENCOUNTER — Other Ambulatory Visit: Payer: Self-pay | Admitting: Nurse Practitioner

## 2023-04-16 ENCOUNTER — Other Ambulatory Visit: Payer: Self-pay | Admitting: Family Medicine

## 2023-04-16 ENCOUNTER — Other Ambulatory Visit: Payer: Self-pay

## 2023-04-16 ENCOUNTER — Other Ambulatory Visit (HOSPITAL_COMMUNITY): Payer: Self-pay | Admitting: Cardiology

## 2023-04-17 ENCOUNTER — Other Ambulatory Visit: Payer: Self-pay

## 2023-04-17 MED ORDER — HYDRALAZINE HCL 100 MG PO TABS
100.0000 mg | ORAL_TABLET | Freq: Three times a day (TID) | ORAL | 0 refills | Status: DC
  Filled 2023-04-17 – 2023-05-14 (×2): qty 90, 30d supply, fill #0

## 2023-04-17 MED ORDER — AMLODIPINE BESYLATE 10 MG PO TABS
10.0000 mg | ORAL_TABLET | Freq: Every day | ORAL | 0 refills | Status: DC
Start: 2023-04-17 — End: 2023-09-20
  Filled 2023-04-17 – 2023-05-14 (×2): qty 30, 30d supply, fill #0

## 2023-04-17 MED ORDER — SPIRONOLACTONE 25 MG PO TABS
25.0000 mg | ORAL_TABLET | Freq: Every day | ORAL | 0 refills | Status: DC
  Filled 2023-04-17 – 2023-05-14 (×2): qty 30, 30d supply, fill #0

## 2023-04-17 MED ORDER — CHLORTHALIDONE 25 MG PO TABS
12.5000 mg | ORAL_TABLET | Freq: Every day | ORAL | 0 refills | Status: DC
  Filled 2023-04-17: qty 15, 30d supply, fill #0

## 2023-04-17 MED ORDER — AMLODIPINE BESYLATE 10 MG PO TABS
10.0000 mg | ORAL_TABLET | Freq: Every day | ORAL | 0 refills | Status: DC
  Filled 2023-04-17: qty 30, 30d supply, fill #0

## 2023-04-17 MED ORDER — CARVEDILOL 12.5 MG PO TABS
12.5000 mg | ORAL_TABLET | Freq: Two times a day (BID) | ORAL | 0 refills | Status: DC
  Filled 2023-04-17: qty 60, 30d supply, fill #0

## 2023-04-17 NOTE — Addendum Note (Signed)
Addended by: Marlene Lard on: 04/17/2023 05:04 PM   Modules accepted: Orders

## 2023-04-18 ENCOUNTER — Other Ambulatory Visit: Payer: Self-pay

## 2023-04-19 ENCOUNTER — Other Ambulatory Visit: Payer: Self-pay

## 2023-04-23 ENCOUNTER — Encounter (HOSPITAL_BASED_OUTPATIENT_CLINIC_OR_DEPARTMENT_OTHER): Payer: Self-pay | Admitting: Cardiovascular Disease

## 2023-04-23 ENCOUNTER — Ambulatory Visit (INDEPENDENT_AMBULATORY_CARE_PROVIDER_SITE_OTHER): Payer: Self-pay | Admitting: Cardiovascular Disease

## 2023-04-23 VITALS — BP 138/82 | HR 84 | Ht 69.0 in | Wt 240.1 lb

## 2023-04-23 DIAGNOSIS — Z5181 Encounter for therapeutic drug level monitoring: Secondary | ICD-10-CM

## 2023-04-23 DIAGNOSIS — I5042 Chronic combined systolic (congestive) and diastolic (congestive) heart failure: Secondary | ICD-10-CM

## 2023-04-23 DIAGNOSIS — Z6835 Body mass index (BMI) 35.0-35.9, adult: Secondary | ICD-10-CM

## 2023-04-23 DIAGNOSIS — Z006 Encounter for examination for normal comparison and control in clinical research program: Secondary | ICD-10-CM

## 2023-04-23 DIAGNOSIS — I1A0 Resistant hypertension: Secondary | ICD-10-CM

## 2023-04-23 DIAGNOSIS — I517 Cardiomegaly: Secondary | ICD-10-CM

## 2023-04-23 NOTE — Patient Instructions (Addendum)
Medication Instructions:  Your physician recommends that you continue on your current medications as directed. Please refer to the Current Medication list given to you today.   Labwork: BMET SOON   Testing/Procedures: NONE  Follow-Up: 3 MONTHS IN ADV HTN CLINIC  You have been referred to  Where: [facility] Address: [address] [place] [address] Phone: [phone] IF YOU DO NOT HEAR FROM THEM YOU CAN CALL THEM DIRECTLY AT THE NUMBER ABOVE   Any Other Special Instructions Will Be Listed Below (If Applicable). WORK HARDER ON DIET AND EXERCISE   If you need a refill on your cardiac medications before your next appointment, please call your pharmacy.

## 2023-04-23 NOTE — Progress Notes (Deleted)
Advanced Hypertension Clinic Initial Assessment:    Date:  04/23/2023   ID:  Robin Jarvis, DOB 11-Sep-1975, MRN [id number]  PCP:  Claiborne Rigg, NP  Cardiologist:  Armanda Magic, MD  Nephrologist:  Referring MD: Claiborne Rigg, NP   CC: Hypertension  History of Present Illness:    Robin Jarvis is a 47 y.o. female with a hx of chronic systolic and diastolic heart failure, hypertension here for a follow up in the Advanced Hypertension Clinic. She was admitted 01/30/22 with a hypertensive and chest pain emergency. She had not been taking any of her medications at the time. Echo revealed LVF 40% with grade 2 diastolic dysfunction, mildly dilated LV, and severe LVH. Coronary CTA showed no CAD and a calcium score of 0. Renal artery dopplers showed normal flow but sever celiac stenosis. She followed up with Gillian Shields NP in HTN clinic 03/01/22 her blood pressure 142/91. She had recently started Amlodipine and was also on Carvedilol, Chorthalidone, Clonidine, Hydralazine, Spironolactone and Imdur. Renin and aldosterone were normal. Normetanephrine and metanephrine were both elevated and she was referred to endocrine. Prior abdominal CT showed no Adrenal adenomas. She followed up with our pharmacist 03/2022. Blood pressure was better controlled but she struggled with polypharmacy.  At her appointment 06/2022 she was working on lifestyle changes and home BP was 130-140s/80-90s.  BP in the office was elevated, which she attributed to dental pain.    Previous antihypertensives: Chlorthalidone Lisinopril (edema in mouth and lips)  Past Medical History:  Diagnosis Date   Chronic combined systolic and diastolic heart failure (HCC) 06/21/2022   Herpes    Hypertension    Migraine    Resistant hypertension 01/30/2022    Past Surgical History:  Procedure Laterality Date   CESAREAN SECTION     TUBAL LIGATION      Current Medications: No outpatient medications have been marked as taking for  the 04/23/23 encounter (Appointment) with Chilton Si, MD.     Allergies:   Lisinopril   Social History   Socioeconomic History   Marital status: Single    Spouse name: Not on file   Number of children: 3   Years of education: Not on file   Highest education level: High school graduate  Occupational History   Occupation: [employer]    Comment: Full time  Tobacco Use   Smoking status: Never   Smokeless tobacco: Never  Vaping Use   Vaping status: Never Used  Substance and Sexual Activity   Alcohol use: Yes    Alcohol/week: 4.0 standard drinks of alcohol    Types: 4 Glasses of wine per week    Comment: occ   Drug use: No   Sexual activity: Yes  Other Topics Concern   Not on file  Social History Narrative   Not on file   Social Determinants of Health   Financial Resource Strain: High Risk (02/14/2022)   Overall Financial Resource Strain (CARDIA)    Difficulty of Paying Living Expenses: Hard  Food Insecurity: No Food Insecurity (01/30/2022)   Hunger Vital Sign    Worried About Running Out of Food in the Last Year: Never true    Ran Out of Food in the Last Year: Never true  Transportation Needs: No Transportation Needs (01/30/2022)   PRAPARE - Administrator, Civil Service (Medical): No    Lack of Transportation (Non-Medical): No  Physical Activity: Not on file  Stress: Not on file  Social Connections: Socially Isolated (02/15/2022)  Social Advertising account executive [NHANES]    Frequency of Communication with Friends and Family: More than three times a week    Frequency of Social Gatherings with Friends and Family: Never    Attends Religious Services: Never    Database administrator or Organizations: No    Attends Engineer, structural: Never    Marital Status: Never married     Family History: The patient's family history includes Diabetes in her maternal aunt; HIV/AIDS in her father and mother; Hyperlipidemia in her maternal aunt;  Hypertension in her maternal aunt.  ROS:   Please see the history of present illness.     All other systems reviewed and are negative.  EKGs/Labs/Other Studies Reviewed:    Bilateral Renal Artery Doppler 01/30/22:  Summary:  Renal:    Right: No evidence of right renal artery stenosis. Abnormal size for         the right kidney. Abnormal right Resistive Index. RRV flow         present.  Left:  No evidence of left renal artery stenosis. Normal size of         left kidney. Abnormal left Resisitve Index. LRV flow present.  Mesenteric:  Normal Celiac artery findings. 70 to 99% stenosis in the superior  mesenteric  artery.   EKG:  The EKG is personally reviewed. 06/21/22: The EKG is not ordered  Recent Labs: 06/25/2022: BUN 29; Creatinine, Ser 1.66; Potassium 4.3; Sodium 137; TSH 1.470   Recent Lipid Panel    Component Value Date/Time   CHOL 151 01/25/2022 1814   TRIG 75 01/25/2022 1814   HDL 36 (L) 01/25/2022 1814   CHOLHDL 4.2 01/25/2022 1814   VLDL 15 01/25/2022 1814   LDLCALC 100 (H) 01/25/2022 1814    Physical Exam:   VS:  There were no vitals taken for this visit. , BMI There is no height or weight on file to calculate BMI. GENERAL:  Well appearing HEENT: Pupils equal round and reactive, fundi not visualized, oral mucosa unremarkable NECK:  No jugular venous distention, waveform within normal limits, carotid upstroke brisk and symmetric, no bruits, no thyromegaly LUNGS:  Clear to auscultation bilaterally HEART:  RRR.  PMI not displaced or sustained,S1 and S2 within normal limits, no S3, no S4, no clicks, no rubs, no murmurs ABD:  Flat, positive bowel sounds normal in frequency in pitch, no bruits, no rebound, no guarding, no midline pulsatile mass, no hepatomegaly, no splenomegaly EXT:  2 plus pulses throughout, no edema, no cyanosis no clubbing SKIN:  No rashes no nodules NEURO:  Cranial nerves II through XII grossly intact, motor grossly intact throughout PSYCH:   Cognitively intact, oriented to person place and time   ASSESSMENT/PLAN:    No problem-specific Assessment & Plan notes found for this encounter.    Screening for Secondary Hypertension:     02/17/2022    4:54 PM  Causes  Drugs/Herbals Screened  Renovascular HTN Screened  Sleep Apnea Not Screened     - Comments sleep study once insurance obtained  Hyperaldosteronism Screened  Compliance Screened    Relevant Labs/Studies:    Latest Ref Rng & Units 06/25/2022    8:47 AM 04/13/2022    3:34 PM 02/14/2022    9:34 AM  Basic Labs  Sodium 134 - 144 mmol/L 137  137  136   Potassium 3.5 - 5.2 mmol/L 4.3  3.6  3.6   Creatinine 0.57 - 1.00 mg/dL 9.51  8.84  1.66  Latest Ref Rng & Units 06/25/2022    8:47 AM 11/11/2019    8:08 AM  Thyroid   TSH 0.450 - 4.500 uIU/mL 1.470  2.975        Latest Ref Rng & Units 01/25/2022    6:14 PM 11/14/2019    7:41 AM 11/11/2019    7:46 AM  Renin/Aldosterone   Aldosterone 0.0 - 30.0 ng/dL 5.0  40.9  81.1        Latest Ref Rng & Units 06/25/2022    8:47 AM  Metanephrines/Catecholamines   Epinephrine 0 - 62 pg/mL 51   Norepinephrine 0 - 874 pg/mL 349   Dopamine 0 - 48 pg/mL <30   Metanephrines 0.0 - 88.0 pg/mL <25.0   Normetanephrines  0.0 - 218.9 pg/mL 32.1           01/30/2022    4:09 PM  Renovascular   Renal Artery [place] Completed Yes     Disposition:    FU with Gillian Shields, NP in 1 month   Medication Adjustments/Labs and Tests Ordered: Current medicines are reviewed at length with the patient today.  Concerns regarding medicines are outlined above.  No orders of the defined types were placed in this encounter.  No orders of the defined types were placed in this encounter.  I,Coren O'Brien,acting as a Neurosurgeon for [employer], MD.,have documented all relevant documentation on the behalf of Chilton Si, MD,as directed by  Chilton Si, MD while in the presence of Chilton Si, MD.  I, Chancey Ringel C. Duke Salvia, MD  have reviewed all documentation for this visit.  The documentation of the exam, diagnosis, procedures, and orders on 04/23/2023 are all accurate and complete.   Signed, Chilton Si, MD  04/23/2023 1:35 PM    [facility]

## 2023-04-23 NOTE — Progress Notes (Signed)
Advanced Hypertension Clinic Follow-up:    Date:  04/23/2023   ID:  Robin Jarvis, DOB [birth year], MRN [id number]  PCP:  Claiborne Rigg, NP  Cardiologist:  Armanda Magic, MD  Nephrologist:  Referring MD: Claiborne Rigg, NP   CC: Hypertension  History of Present Illness:    Robin Jarvis is a 47 y.o. female with a hx of chronic systolic and diastolic heart failure, hypertension here for a follow up in the Advanced Hypertension Clinic. She was admitted 01/30/22 with a hypertensive and chest pain emergency. She had not been taking any of her medications at the time. Echo revealed LVF 40% with grade 2 diastolic dysfunction, mildly dilated LV, and severe LVH. Coronary CTA showed no CAD and a calcium score of 0. Renal artery dopplers showed normal flow but sever celiac stenosis. She followed up with Gillian Shields NP in HTN clinic 03/01/22 her blood pressure 142/91. She had recently started Amlodipine and was also on Carvedilol, Chorthalidone, Clonidine, Hydralazine, Spironolactone and Imdur. Renin and aldosterone were normal. Normetanephrine and metanephrine were both elevated and she was referred to endocrine. Prior abdominal CT showed no Adrenal adenomas. She followed up with our pharmacist 03/2022. Blood pressure was better controlled but she struggled with polypharmacy.  At her appointment 06/2022 she was working on lifestyle changes and home BP was 130-140s/80-90s.  BP in the office was elevated, which she attributed to dental pain.   Today, she reports going through multiple life stressors since her last visit. 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 In the office her blood pressure is 140/88 initially, and 138/82 on manual recheck. Typically she doesn't monitor her home readings. If she feels "funky" she will check her blood pressure, may be elevated. Usually this is in the setting of forgetting a dose of medication which doesn't happen often. May go  for a walk on the weekends for exercise, completes 3-4 miles at a purposeful pace. This will take about an hour. Her appetite will fluctuate. On some days she doesn't want to eat much beyond her morning smoothie and trail mix. Normally has a smoothie in the mornings that she makes with fresh fruit, chia seeds, coconut water. She also has salads, and prepares meals with an air fryer. Rarely orders fast food. Recently she has started taking a vitamin B complex supplement. She denies any palpitations, chest pain, shortness of breath, peripheral edema, lightheadedness, headaches, syncope, orthopnea, or PND.  Previous antihypertensives: Chlorthalidone Lisinopril (edema in mouth and lips)  Past Medical History:  Diagnosis Date   Chronic combined systolic and diastolic heart failure (HCC) 06/21/2022   Herpes    Hypertension    Migraine    Resistant hypertension 01/30/2022    Past Surgical History:  Procedure Laterality Date   CESAREAN SECTION     TUBAL LIGATION      Current Medications: Current Meds  Medication Sig   amLODipine (NORVASC) 10 MG tablet Take 1 tablet (10 mg total) by mouth daily.   carvedilol (COREG) 12.5 MG tablet Take 1 tablet (12.5 mg total) by mouth 2 (two) times daily.   chlorthalidone (HYGROTON) 25 MG tablet Take 0.5 tablets (12.5 mg total) by mouth daily.   cloNIDine (CATAPRES) 0.2 MG tablet Take 1 tablet (0.2 mg total) by mouth 3 (three) times daily.   dapagliflozin propanediol (FARXIGA) 10 MG TABS tablet Take 1 tablet (10 mg total) by mouth daily.   hydrALAZINE (APRESOLINE) 100 MG tablet Take 1 tablet (100  mg total) by mouth every 8 (eight) hours.   isosorbide mononitrate (IMDUR) 30 MG 24 hr tablet Take 1 tablet (30 mg total) by mouth daily.   Multiple Vitamin (MULTIVITAMIN WITH MINERALS) TABS tablet Take 1 tablet by mouth daily.   spironolactone (ALDACTONE) 25 MG tablet Take 1 tablet (25 mg total) by mouth daily.     Allergies:   Lisinopril   Social History    Socioeconomic History   Marital status: Single    Spouse name: Not on file   Number of children: 3   Years of education: Not on file   Highest education level: High school graduate  Occupational History   Occupation: [employer]    Comment: Full time  Tobacco Use   Smoking status: Never   Smokeless tobacco: Never  Vaping Use   Vaping status: Never Used  Substance and Sexual Activity   Alcohol use: Yes    Alcohol/week: 4.0 standard drinks of alcohol    Types: 4 Glasses of wine per week    Comment: occ   Drug use: No   Sexual activity: Yes  Other Topics Concern   Not on file  Social History Narrative   Not on file   Social Determinants of Health   Financial Resource Strain: High Risk (02/14/2022)   Overall Financial Resource Strain (CARDIA)    Difficulty of Paying Living Expenses: Hard  Food Insecurity: No Food Insecurity (01/30/2022)   Hunger Vital Sign    Worried About Running Out of Food in the Last Year: Never true    Ran Out of Food in the Last Year: Never true  Transportation Needs: No Transportation Needs (01/30/2022)   PRAPARE - Administrator, Civil Service (Medical): No    Lack of Transportation (Non-Medical): No  Physical Activity: Not on file  Stress: Not on file  Social Connections: Socially Isolated (02/15/2022)   Social Connection and Isolation Panel [NHANES]    Frequency of Communication with Friends and Family: More than three times a week    Frequency of Social Gatherings with Friends and Family: Never    Attends Religious Services: Never    Database administrator or Organizations: No    Attends Engineer, structural: Never    Marital Status: Never married     Family History: The patient's family history includes Diabetes in her maternal aunt; HIV/AIDS in her father and mother; Hyperlipidemia in her maternal aunt; Hypertension in her maternal aunt.  ROS:   Please see the history of present illness.  All other systems reviewed  and are negative.  EKGs/Labs/Other Studies Reviewed:    Echo  07/11/2022:  1. Compared to 01/26/22 LV function has improved.   2. Left ventricular ejection fraction, by estimation, is 70 to 75%. The  left ventricle has hyperdynamic function. The left ventricle has no  regional wall motion abnormalities. There is moderate concentric left  ventricular hypertrophy. Left ventricular  diastolic parameters are consistent with Grade I diastolic dysfunction  (impaired relaxation). The average left ventricular global longitudinal  strain is -17.4 %. The global longitudinal strain is normal.   3. Right ventricular systolic function is normal. The right ventricular  size is normal.   4. The mitral valve is normal in structure. Trivial mitral valve  regurgitation. No evidence of mitral stenosis.   5. The aortic valve is tricuspid. Aortic valve regurgitation is not  visualized. Aortic valve sclerosis is present, with no evidence of aortic  valve stenosis.   6.  Aortic dilatation noted. There is borderline dilatation of the  ascending aorta, measuring 38 mm.   7. The inferior vena cava is normal in size with greater than 50%  respiratory variability, suggesting right atrial pressure of 3 mmHg.   Bilateral Renal Artery Doppler 01/30/22: Summary:  Renal:    Right: No evidence of right renal artery stenosis. Abnormal size for         the right kidney. Abnormal right Resistive Index. RRV flow         present.  Left:  No evidence of left renal artery stenosis. Normal size of         left kidney. Abnormal left Resisitve Index. LRV flow present.  Mesenteric:  Normal Celiac artery findings. 70 to 99% stenosis in the superior  mesenteric artery.   EKG:  The EKG is personally reviewed. 04/23/2023:  NSR with PACs. Rate 84 bpm. 06/21/22: Not ordered  Recent Labs: 06/25/2022: BUN 29; Creatinine, Ser 1.66; Potassium 4.3; Sodium 137; TSH 1.470   Recent Lipid Panel    Component Value Date/Time   CHOL  151 01/25/2022 1814   TRIG 75 01/25/2022 1814   HDL 36 (L) 01/25/2022 1814   CHOLHDL 4.2 01/25/2022 1814   VLDL 15 01/25/2022 1814   LDLCALC 100 (H) 01/25/2022 1814    Physical Exam:    VS:  BP 138/82 (BP Location: Right Arm, Patient Position: Sitting, Cuff Size: Large)   Pulse 84   Ht 5\' 9"  (1.753 m)   Wt 240 lb 1.6 oz (108.9 kg)   BMI 35.46 kg/m  , BMI Body mass index is 35.46 kg/m. GENERAL:  Well appearing HEENT: Pupils equal round and reactive, fundi not visualized, oral mucosa unremarkable NECK:  No jugular venous distention, waveform within normal limits, carotid upstroke brisk and symmetric, no bruits, no thyromegaly LUNGS:  Clear to auscultation bilaterally HEART:  RRR.  PMI not displaced or sustained,S1 and S2 within normal limits, no S3, no S4, no clicks, no rubs, no murmurs ABD:  Flat, positive bowel sounds normal in frequency in pitch, no bruits, no rebound, no guarding, no midline pulsatile mass, no hepatomegaly, no splenomegaly EXT:  2 plus pulses throughout, no edema, no cyanosis no clubbing SKIN:  No rashes no nodules NEURO:  Cranial nerves II through XII grossly intact, motor grossly intact throughout PSYCH:  Cognitively intact, oriented to person place and time   ASSESSMENT/PLAN:    # Hypertension Elevated blood pressure readings despite medication regimen. Patient reports inconsistent eating habits which may affect medication absorption and efficacy. Patient is adherent to medication regimen but struggles with dietary consistency and exercise. -Encourage consistent dietary habits to ensure effective medication absorption. -Encourage increased physical activity, aiming for 150 minutes per week. -Refer to [facility] for more specific dietary guidance. -Check basic metabolic panel today to assess kidney function. - Continue amlodipine, carvedilol, chlorthalidone, clonidine, hydralazine, Imdur, and spironolactone.  Consider renal denervation if BP  remains uncontrolled with additional lifestyle changes.   # Chronic systolic and diastolic HF:  Normalized systolic function noted on recent echocardiogram. -Continue current medication regimen to maintain improved cardiac function.  No ACE-I/ARB 2/2 angioedema.   General Health Maintenance -Consider future options for blood pressure control such as renal denervation or new medications for resistant hypertension when available and appropriate. -Follow up in a few months to assess progress.        Screening for Secondary Hypertension:     02/17/2022    4:54 PM  Causes  Drugs/Herbals Screened  Renovascular HTN Screened  Sleep Apnea Not Screened     - Comments sleep study once insurance obtained  Hyperaldosteronism Screened  Compliance Screened    Relevant Labs/Studies:    Latest Ref Rng & Units 06/25/2022    8:47 AM 04/13/2022    3:34 PM 02/14/2022    9:34 AM  Basic Labs  Sodium 134 - 144 mmol/L 137  137  136   Potassium 3.5 - 5.2 mmol/L 4.3  3.6  3.6   Creatinine 0.57 - 1.00 mg/dL 8.29  5.62  1.30        Latest Ref Rng & Units 06/25/2022    8:47 AM 11/11/2019    8:08 AM  Thyroid   TSH 0.450 - 4.500 uIU/mL 1.470  2.975        Latest Ref Rng & Units 01/25/2022    6:14 PM 11/14/2019    7:41 AM 11/11/2019    7:46 AM  Renin/Aldosterone   Aldosterone 0.0 - 30.0 ng/dL 5.0  86.5  78.4        Latest Ref Rng & Units 06/25/2022    8:47 AM  Metanephrines/Catecholamines   Epinephrine 0 - 62 pg/mL 51   Norepinephrine 0 - 874 pg/mL 349   Dopamine 0 - 48 pg/mL <30   Metanephrines 0.0 - 88.0 pg/mL <25.0   Normetanephrines  0.0 - 218.9 pg/mL 32.1           01/30/2022    4:09 PM  Renovascular   Renal Artery [place] Completed Yes     Disposition:    FU with Cherolyn Behrle C. Duke Salvia, MD, [facility] in 3-4 months.  Medication Adjustments/Labs and Tests Ordered: Current medicines are reviewed at length with the patient today.  Concerns regarding medicines are outlined above.  Orders Placed This  Encounter  Procedures   Basic metabolic panel   Ambulatory referral to [facility]   Cantril's Ladder Assessment   EKG 12-Lead   No orders of the defined types were placed in this encounter.  I,Mathew Stumpf,acting as a Neurosurgeon for Chilton Si, MD.,have documented all relevant documentation on the behalf of Chilton Si, MD,as directed by  Chilton Si, MD while in the presence of Chilton Si, MD.  I, Kendale Rembold C. Duke Salvia, MD have reviewed all documentation for this visit.  The documentation of the exam, diagnosis, procedures, and orders on 04/23/2023 are all accurate and complete.  Signed, Chilton Si, MD  04/23/2023 6:00 PM    [facility]

## 2023-04-25 LAB — BASIC METABOLIC PANEL
BUN/Creatinine Ratio: 16 (ref 9–23)
BUN: 21 mg/dL (ref 6–24)
CO2: 24 mmol/L (ref 20–29)
Calcium: 10.2 mg/dL (ref 8.7–10.2)
Chloride: 98 mmol/L (ref 96–106)
Creatinine, Ser: 1.35 mg/dL — ABNORMAL HIGH (ref 0.57–1.00)
Glucose: 71 mg/dL (ref 70–99)
Potassium: 3.9 mmol/L (ref 3.5–5.2)
Sodium: 139 mmol/L (ref 134–144)
eGFR: 49 mL/min/{1.73_m2} — ABNORMAL LOW (ref >59)

## 2023-04-26 NOTE — Research (Signed)
I saw pt today after Dr. Leonides Sake follow up visit. Pt is in Dr. Leonides Sake Virtual Care HTN Study. Pt filled out research survey. Pt was enrolled in Group 1. Pt has completed the Virtual Care HTN Study.

## 2023-05-02 ENCOUNTER — Encounter (HOSPITAL_BASED_OUTPATIENT_CLINIC_OR_DEPARTMENT_OTHER): Payer: Self-pay

## 2023-05-14 ENCOUNTER — Other Ambulatory Visit (HOSPITAL_BASED_OUTPATIENT_CLINIC_OR_DEPARTMENT_OTHER): Payer: Self-pay | Admitting: Cardiology

## 2023-05-14 ENCOUNTER — Other Ambulatory Visit (HOSPITAL_BASED_OUTPATIENT_CLINIC_OR_DEPARTMENT_OTHER): Payer: Self-pay | Admitting: Cardiovascular Disease

## 2023-05-14 ENCOUNTER — Other Ambulatory Visit: Payer: Self-pay

## 2023-05-14 MED ORDER — CARVEDILOL 12.5 MG PO TABS
12.5000 mg | ORAL_TABLET | Freq: Two times a day (BID) | ORAL | 3 refills | Status: DC
  Filled 2023-05-14: qty 180, 90d supply, fill #0
  Filled 2023-09-20 (×3): qty 180, 90d supply, fill #1

## 2023-05-14 MED ORDER — CHLORTHALIDONE 25 MG PO TABS
12.5000 mg | ORAL_TABLET | Freq: Every day | ORAL | 3 refills | Status: DC
  Filled 2023-05-14: qty 45, 90d supply, fill #0
  Filled 2023-09-20 (×3): qty 45, 90d supply, fill #1

## 2023-05-15 ENCOUNTER — Other Ambulatory Visit: Payer: Self-pay

## 2023-05-15 MED ORDER — ISOSORBIDE MONONITRATE ER 30 MG PO TB24
30.0000 mg | ORAL_TABLET | Freq: Every day | ORAL | 3 refills | Status: AC
  Filled 2023-05-15 – 2023-05-27 (×2): qty 90, 90d supply, fill #0
  Filled 2023-09-20 (×3): qty 90, 90d supply, fill #1

## 2023-05-27 ENCOUNTER — Other Ambulatory Visit: Payer: Self-pay

## 2023-06-24 ENCOUNTER — Ambulatory Visit (HOSPITAL_BASED_OUTPATIENT_CLINIC_OR_DEPARTMENT_OTHER): Payer: BC Managed Care – PPO

## 2023-06-24 DIAGNOSIS — I77810 Thoracic aortic ectasia: Secondary | ICD-10-CM

## 2023-06-24 DIAGNOSIS — I5042 Chronic combined systolic (congestive) and diastolic (congestive) heart failure: Secondary | ICD-10-CM | POA: Diagnosis not present

## 2023-06-25 LAB — ECHOCARDIOGRAM COMPLETE
Area-P 1/2: 3.77 cm2
S' Lateral: 2.74 cm

## 2023-07-24 ENCOUNTER — Encounter (HOSPITAL_BASED_OUTPATIENT_CLINIC_OR_DEPARTMENT_OTHER): Payer: BC Managed Care – PPO | Admitting: Cardiovascular Disease

## 2023-07-24 NOTE — Progress Notes (Unsigned)
Advanced Hypertension Clinic Follow-up:    Date:  07/24/2023   ID:  Robin Jarvis, DOB 1976-06-25, MRN [id number]  PCP:  Claiborne Rigg, NP  Cardiologist:  Armanda Magic, MD  Nephrologist:  Referring MD: Claiborne Rigg, NP   CC: Hypertension  History of Present Illness:    Robin Jarvis is a 47 y.o. female with a hx of chronic systolic and diastolic heart failure, hypertension here for a follow up in the Advanced Hypertension Clinic. She was admitted 01/30/22 with a hypertensive and chest pain emergency. She had not been taking any of her medications at the time. Echo revealed LVF 40% with grade 2 diastolic dysfunction, mildly dilated LV, and severe LVH. Coronary CTA showed no CAD and a calcium score of 0. Renal artery dopplers showed normal flow but sever celiac stenosis. She followed up with Gillian Shields NP in HTN clinic 03/01/22 her blood pressure 142/91. She had recently started Amlodipine and was also on Carvedilol, Chorthalidone, Clonidine, Hydralazine, Spironolactone and Imdur. Renin and aldosterone were normal. Normetanephrine and metanephrine were both elevated and she was referred to endocrine. Prior abdominal CT showed no Adrenal adenomas. She followed up with our pharmacist 03/2022. Blood pressure was better controlled but she struggled with polypharmacy.  At her appointment 06/2022 she was working on lifestyle changes and home BP was 130-140s/80-90s.  BP in the office was elevated, which she attributed to dental pain.  BP was elevated at follow up 04/2023.  She was encouraged to make lifestyle changes.  She was referred to [facility] and there was consideration for renal denervation.   Previous antihypertensives: Chlorthalidone Lisinopril (edema in mouth and lips)  Past Medical History:  Diagnosis Date   Chronic combined systolic and diastolic heart failure (HCC) 06/21/2022   Herpes    Hypertension    Migraine    Resistant hypertension 01/30/2022    Past Surgical History:   Procedure Laterality Date   CESAREAN SECTION     TUBAL LIGATION      Current Medications: No outpatient medications have been marked as taking for the 07/24/23 encounter (Appointment) with Chilton Si, MD.     Allergies:   Lisinopril   Social History   Socioeconomic History   Marital status: Single    Spouse name: Not on file   Number of children: 3   Years of education: Not on file   Highest education level: High school graduate  Occupational History   Occupation: [employer]    Comment: Full time  Tobacco Use   Smoking status: Never   Smokeless tobacco: Never  Vaping Use   Vaping status: Never Used  Substance and Sexual Activity   Alcohol use: Yes    Alcohol/week: 4.0 standard drinks of alcohol    Types: 4 Glasses of wine per week    Comment: occ   Drug use: No   Sexual activity: Yes  Other Topics Concern   Not on file  Social History Narrative   Not on file   Social Determinants of Health   Financial Resource Strain: High Risk (02/14/2022)   Overall Financial Resource Strain (CARDIA)    Difficulty of Paying Living Expenses: Hard  Food Insecurity: No Food Insecurity (01/30/2022)   Hunger Vital Sign    Worried About Running Out of Food in the Last Year: Never true    Ran Out of Food in the Last Year: Never true  Transportation Needs: No Transportation Needs (01/30/2022)   PRAPARE - Transportation    Lack of Transportation (  Medical): No    Lack of Transportation (Non-Medical): No  Physical Activity: Not on file  Stress: Not on file  Social Connections: Socially Isolated (02/15/2022)   Social Connection and Isolation Panel [NHANES]    Frequency of Communication with Friends and Family: More than three times a week    Frequency of Social Gatherings with Friends and Family: Never    Attends Religious Services: Never    Database administrator or Organizations: No    Attends Engineer, structural: Never    Marital Status: Never married     Family  History: The patient's family history includes Diabetes in her maternal aunt; HIV/AIDS in her father and mother; Hyperlipidemia in her maternal aunt; Hypertension in her maternal aunt.  ROS:   Please see the history of present illness.  All other systems reviewed and are negative.  EKGs/Labs/Other Studies Reviewed:    Echo  07/11/2022:  1. Compared to 01/26/22 LV function has improved.   2. Left ventricular ejection fraction, by estimation, is 70 to 75%. The  left ventricle has hyperdynamic function. The left ventricle has no  regional wall motion abnormalities. There is moderate concentric left  ventricular hypertrophy. Left ventricular  diastolic parameters are consistent with Grade I diastolic dysfunction  (impaired relaxation). The average left ventricular global longitudinal  strain is -17.4 %. The global longitudinal strain is normal.   3. Right ventricular systolic function is normal. The right ventricular  size is normal.   4. The mitral valve is normal in structure. Trivial mitral valve  regurgitation. No evidence of mitral stenosis.   5. The aortic valve is tricuspid. Aortic valve regurgitation is not  visualized. Aortic valve sclerosis is present, with no evidence of aortic  valve stenosis.   6. Aortic dilatation noted. There is borderline dilatation of the  ascending aorta, measuring 38 mm.   7. The inferior vena cava is normal in size with greater than 50%  respiratory variability, suggesting right atrial pressure of 3 mmHg.   Bilateral Renal Artery Doppler 01/30/22: Summary:  Renal:    Right: No evidence of right renal artery stenosis. Abnormal size for         the right kidney. Abnormal right Resistive Index. RRV flow         present.  Left:  No evidence of left renal artery stenosis. Normal size of         left kidney. Abnormal left Resisitve Index. LRV flow present.  Mesenteric:  Normal Celiac artery findings. 70 to 99% stenosis in the superior  mesenteric  artery.   EKG:  The EKG is personally reviewed. 04/23/2023:  NSR with PACs. Rate 84 bpm. 06/21/22: Not ordered  Recent Labs: 04/24/2023: BUN 21; Creatinine, Ser 1.35; Potassium 3.9; Sodium 139   Recent Lipid Panel    Component Value Date/Time   CHOL 151 01/25/2022 1814   TRIG 75 01/25/2022 1814   HDL 36 (L) 01/25/2022 1814   CHOLHDL 4.2 01/25/2022 1814   VLDL 15 01/25/2022 1814   LDLCALC 100 (H) 01/25/2022 1814    Physical Exam:    VS:  There were no vitals taken for this visit. , BMI There is no height or weight on file to calculate BMI. GENERAL:  Well appearing HEENT: Pupils equal round and reactive, fundi not visualized, oral mucosa unremarkable NECK:  No jugular venous distention, waveform within normal limits, carotid upstroke brisk and symmetric, no bruits, no thyromegaly LUNGS:  Clear to auscultation bilaterally HEART:  RRR.  PMI not displaced or sustained,S1 and S2 within normal limits, no S3, no S4, no clicks, no rubs, no murmurs ABD:  Flat, positive bowel sounds normal in frequency in pitch, no bruits, no rebound, no guarding, no midline pulsatile mass, no hepatomegaly, no splenomegaly EXT:  2 plus pulses throughout, no edema, no cyanosis no clubbing SKIN:  No rashes no nodules NEURO:  Cranial nerves II through XII grossly intact, motor grossly intact throughout PSYCH:  Cognitively intact, oriented to person place and time   ASSESSMENT/PLAN:             Screening for Secondary Hypertension:     02/17/2022    4:54 PM  Causes  Drugs/Herbals Screened  Renovascular HTN Screened  Sleep Apnea Not Screened     - Comments sleep study once insurance obtained  Hyperaldosteronism Screened  Compliance Screened    Relevant Labs/Studies:    Latest Ref Rng & Units 04/24/2023    3:03 PM 06/25/2022    8:47 AM 04/13/2022    3:34 PM  Basic Labs  Sodium 134 - 144 mmol/L 139  137  137   Potassium 3.5 - 5.2 mmol/L 3.9  4.3  3.6   Creatinine 0.57 - 1.00 mg/dL 4.09  8.11   9.14        Latest Ref Rng & Units 06/25/2022    8:47 AM 11/11/2019    8:08 AM  Thyroid   TSH 0.450 - 4.500 uIU/mL 1.470  2.975        Latest Ref Rng & Units 01/25/2022    6:14 PM 11/14/2019    7:41 AM 11/11/2019    7:46 AM  Renin/Aldosterone   Aldosterone 0.0 - 30.0 ng/dL 5.0  78.2  95.6        Latest Ref Rng & Units 06/25/2022    8:47 AM  Metanephrines/Catecholamines   Epinephrine 0 - 62 pg/mL 51   Norepinephrine 0 - 874 pg/mL 349   Dopamine 0 - 48 pg/mL <30   Metanephrines 0.0 - 88.0 pg/mL <25.0   Normetanephrines  0.0 - 218.9 pg/mL 32.1           01/30/2022    4:09 PM  Renovascular   Renal Artery [place] Completed Yes     Disposition:    FU with Herb Beltre C. Duke Salvia, MD, [facility] in 3-4 months.  Medication Adjustments/Labs and Tests Ordered: Current medicines are reviewed at length with the patient today.  Concerns regarding medicines are outlined above.  No orders of the defined types were placed in this encounter.  No orders of the defined types were placed in this encounter.   Signed, Chilton Si, MD  07/24/2023 1:43 PM    [facility]

## 2023-09-03 ENCOUNTER — Encounter (HOSPITAL_BASED_OUTPATIENT_CLINIC_OR_DEPARTMENT_OTHER): Payer: Self-pay | Admitting: Cardiovascular Disease

## 2023-09-20 ENCOUNTER — Other Ambulatory Visit (HOSPITAL_BASED_OUTPATIENT_CLINIC_OR_DEPARTMENT_OTHER): Payer: Self-pay | Admitting: Cardiovascular Disease

## 2023-09-20 ENCOUNTER — Other Ambulatory Visit: Payer: Self-pay

## 2023-09-20 MED ORDER — AMLODIPINE BESYLATE 10 MG PO TABS
10.0000 mg | ORAL_TABLET | Freq: Every day | ORAL | 0 refills | Status: DC
  Filled 2023-09-20: qty 90, 90d supply, fill #0

## 2023-09-20 MED ORDER — HYDRALAZINE HCL 100 MG PO TABS
100.0000 mg | ORAL_TABLET | Freq: Three times a day (TID) | ORAL | 1 refills | Status: AC
  Filled 2023-09-20: qty 180, 60d supply, fill #0

## 2023-09-20 MED ORDER — SPIRONOLACTONE 25 MG PO TABS
25.0000 mg | ORAL_TABLET | Freq: Every day | ORAL | 0 refills | Status: DC
  Filled 2023-09-20: qty 90, 90d supply, fill #0

## 2023-10-03 ENCOUNTER — Encounter (HOSPITAL_BASED_OUTPATIENT_CLINIC_OR_DEPARTMENT_OTHER): Payer: BC Managed Care – PPO | Admitting: Cardiovascular Disease

## 2024-01-03 ENCOUNTER — Other Ambulatory Visit: Payer: Self-pay

## 2024-01-03 ENCOUNTER — Emergency Department (HOSPITAL_BASED_OUTPATIENT_CLINIC_OR_DEPARTMENT_OTHER)

## 2024-01-03 ENCOUNTER — Encounter (HOSPITAL_BASED_OUTPATIENT_CLINIC_OR_DEPARTMENT_OTHER): Payer: Self-pay | Admitting: Emergency Medicine

## 2024-01-03 ENCOUNTER — Other Ambulatory Visit (HOSPITAL_BASED_OUTPATIENT_CLINIC_OR_DEPARTMENT_OTHER): Payer: Self-pay

## 2024-01-03 ENCOUNTER — Emergency Department (HOSPITAL_BASED_OUTPATIENT_CLINIC_OR_DEPARTMENT_OTHER)
Admission: EM | Admit: 2024-01-03 | Discharge: 2024-01-03 | Disposition: A | Discharge status: Home / Self Care | Attending: Emergency Medicine | Admitting: Emergency Medicine

## 2024-01-03 DIAGNOSIS — I110 Hypertensive heart disease with heart failure: Secondary | ICD-10-CM | POA: Insufficient documentation

## 2024-01-03 DIAGNOSIS — I5040 Unspecified combined systolic (congestive) and diastolic (congestive) heart failure: Secondary | ICD-10-CM | POA: Insufficient documentation

## 2024-01-03 DIAGNOSIS — I160 Hypertensive urgency: Secondary | ICD-10-CM

## 2024-01-03 LAB — CBC WITH DIFFERENTIAL/PLATELET
Abs Immature Granulocytes: 0.04 10*3/uL (ref 0.00–0.07)
Basophils Absolute: 0.1 10*3/uL (ref 0.0–0.1)
Basophils Relative: 1 %
Eosinophils Absolute: 0.1 10*3/uL (ref 0.0–0.5)
Eosinophils Relative: 1 %
HCT: 38.5 % (ref 36.0–46.0)
Hemoglobin: 13 g/dL (ref 12.0–15.0)
Immature Granulocytes: 0 %
Lymphocytes Relative: 19 %
Lymphs Abs: 1.9 10*3/uL (ref 0.7–4.0)
MCH: 30.8 pg (ref 26.0–34.0)
MCHC: 33.8 g/dL (ref 30.0–36.0)
MCV: 91.2 fL (ref 80.0–100.0)
Monocytes Absolute: 1 10*3/uL (ref 0.1–1.0)
Monocytes Relative: 9 %
Neutro Abs: 7.1 10*3/uL (ref 1.7–7.7)
Neutrophils Relative %: 70 %
Platelets: 336 10*3/uL (ref 150–400)
RBC: 4.22 MIL/uL (ref 3.87–5.11)
RDW: 12 % (ref 11.5–15.5)
WBC: 10.2 10*3/uL (ref 4.0–10.5)
nRBC: 0 % (ref 0.0–0.2)

## 2024-01-03 LAB — BASIC METABOLIC PANEL WITH GFR
Anion gap: 12 (ref 5–15)
BUN: 15 mg/dL (ref 6–20)
CO2: 29 mmol/L (ref 22–32)
Calcium: 9.7 mg/dL (ref 8.9–10.3)
Chloride: 93 mmol/L — ABNORMAL LOW (ref 98–111)
Creatinine, Ser: 1.1 mg/dL — ABNORMAL HIGH (ref 0.44–1.00)
GFR, Estimated: >60 mL/min (ref >60)
Glucose, Bld: 153 mg/dL — ABNORMAL HIGH (ref 70–99)
Potassium: 2.9 mmol/L — ABNORMAL LOW (ref 3.5–5.1)
Sodium: 135 mmol/L (ref 135–145)

## 2024-01-03 LAB — TROPONIN T, HIGH SENSITIVITY: Troponin T High Sensitivity: 16 ng/L (ref <19)

## 2024-01-03 MED ORDER — CHLORTHALIDONE 25 MG PO TABS
12.5000 mg | ORAL_TABLET | Freq: Every day | ORAL | 3 refills | Status: AC
  Filled 2024-01-03: qty 45, 90d supply, fill #0

## 2024-01-03 MED ORDER — POTASSIUM CHLORIDE CRYS ER 20 MEQ PO TBCR
20.0000 meq | EXTENDED_RELEASE_TABLET | Freq: Every day | ORAL | 0 refills | Status: AC
  Filled 2024-01-03: qty 5, 5d supply, fill #0

## 2024-01-03 MED ORDER — HYDRALAZINE HCL 25 MG PO TABS
50.0000 mg | ORAL_TABLET | Freq: Once | ORAL | Status: AC
  Administered 2024-01-03: 50 mg via ORAL
  Filled 2024-01-03: qty 2

## 2024-01-03 MED ORDER — CLONIDINE HCL 0.2 MG PO TABS
0.2000 mg | ORAL_TABLET | Freq: Three times a day (TID) | ORAL | 1 refills | Status: AC
  Filled 2024-01-03: qty 270, 90d supply, fill #0

## 2024-01-03 MED ORDER — CARVEDILOL 12.5 MG PO TABS
12.5000 mg | ORAL_TABLET | Freq: Two times a day (BID) | ORAL | 3 refills | Status: AC
  Filled 2024-01-03: qty 180, 90d supply, fill #0

## 2024-01-03 MED ORDER — AMLODIPINE BESYLATE 10 MG PO TABS
10.0000 mg | ORAL_TABLET | Freq: Every day | ORAL | 0 refills | Status: AC
  Filled 2024-01-03: qty 90, 90d supply, fill #0

## 2024-01-03 MED ORDER — POTASSIUM CHLORIDE CRYS ER 20 MEQ PO TBCR
40.0000 meq | EXTENDED_RELEASE_TABLET | Freq: Once | ORAL | Status: AC
  Administered 2024-01-03: 40 meq via ORAL
  Filled 2024-01-03: qty 2

## 2024-01-03 NOTE — ED Provider Notes (Signed)
 [facility] AT [facility] Provider Note   CSN: [id number] Arrival date & time: 01/03/24  5366     History Chief Complaint  Patient presents with   Hypertension    HPI Robin Jarvis is a 48 y.o. female presenting for chief complaint of elevated blood pressure.  48 year old female history of combined systolic/diastolic heart failure, hypertension follows with advanced hypertension clinic. Prior admission for hypertensive crisis resulting in acute heart failure exacerbation. Most recent documentation shows recommended medical regimen with clonidine , amlodipine , chlorthalidone , Coreg , hydralazine , Imdur , spironolactone . No symptoms today.  Denies chest pain shortness of breath or any active symptoms at this time.  Patient's recorded medical, surgical, social, medication list and allergies were reviewed in the Snapshot window as part of the initial history.   Review of Systems   Review of Systems  Constitutional:  Negative for chills and fever.  HENT:  Negative for ear pain and sore throat.   Eyes:  Negative for pain and visual disturbance.  Respiratory:  Negative for cough and shortness of breath.   Cardiovascular:  Negative for chest pain and palpitations.  Gastrointestinal:  Negative for abdominal pain and vomiting.  Genitourinary:  Negative for dysuria and hematuria.  Musculoskeletal:  Negative for arthralgias and back pain.  Skin:  Negative for color change and rash.  Neurological:  Negative for seizures and syncope.  All other systems reviewed and are negative.   Physical Exam Updated Vital Signs BP (!) 163/89 (BP Location: Right Arm)   Pulse 71   Temp 99.6 F (37.6 C) (Oral)   Resp 15   Ht 5\' 9"  (1.753 m)   Wt 112.5 kg   LMP 12/22/2023   SpO2 100%   BMI 36.62 kg/m  Physical Exam Vitals and nursing note reviewed.  Constitutional:      General: She is not in acute distress.    Appearance: She is well-developed.  HENT:     Head:  Normocephalic and atraumatic.  Eyes:     Conjunctiva/sclera: Conjunctivae normal.  Cardiovascular:     Rate and Rhythm: Normal rate and regular rhythm.     Heart sounds: No murmur heard. Pulmonary:     Effort: Pulmonary effort is normal. No respiratory distress.     Breath sounds: Normal breath sounds.  Abdominal:     General: There is no distension.     Palpations: Abdomen is soft.     Tenderness: There is no abdominal tenderness. There is no right CVA tenderness or left CVA tenderness.  Musculoskeletal:        General: No swelling or tenderness. Normal range of motion.     Cervical back: Neck supple.  Skin:    General: Skin is warm and dry.  Neurological:     General: No focal deficit present.     Mental Status: She is alert and oriented to person, place, and time. Mental status is at baseline.     Cranial Nerves: No cranial nerve deficit.      ED Course/ Medical Decision Making/ A&P    Procedures Procedures   Medications Ordered in ED Medications - No data to display Medical Decision Making:   Robin Jarvis is a 48 y.o. female who presented to the ED today with elevated BP detailed above.    Patient placed on continuous vitals and telemetry monitoring while in ED which was reviewed periodically.  Complete initial physical exam performed, notably the patient  was hemodynamically stable no acute distress.  No active symptoms at  this time.    Reviewed and confirmed nursing documentation for past medical history, family history, social history.    Initial Assessment:   With the patient's presentation of elevated blood pressure readings, most likely diagnosis is hypertensive urgency. Other diagnoses associated with hypertensive emergency were considered including (but not limited to) intracranial hemorrhage, acute renal artery stenosis, acute kidney injury, myocardial stress, ophthalmologic emergencies. These are considered less likely due to history of present illness and  physical exam findings.   This is most consistent with an acute life/limb threatening illness complicated by underlying chronic conditions. Will evaluate for hypertensive emergency as below.  Initial Plan:  Screening labs including CBC and Metabolic panel to evaluate for infectious or metabolic etiology of disease.  Urinalysis with reflex culture ordered to evaluate for UTI or relevant urologic/nephrologic pathology.  CXR to evaluate for structural/infectious intrathoracic pathology.  EKG and single troponin to evaluate for cardiac pathology. Single troponin appropriate due to greater than 6 hours since maximal intensity of symptoms. Objective evaluation as below reviewed. Considered further administration of antihypertensives in ED, per consensus guidelines for [facility] of emergency physicians, acute treatment of hypertensive urgency alone in the emergency department is not recommended.  If patient has evidence of hypertensive emergency on objective laboratory evaluation, will reevaluate.  Will monitor blood pressure while patient awaiting above laboratory studies.  Initial Study Results:   Laboratory  All laboratory results reviewed without evidence of clinically relevant pathology.     EKG EKG was reviewed independently. Rate, rhythm, axis, intervals all examined and without medically relevant abnormality. ST segments without concerns for elevations.    Radiology:  All images reviewed independently. Agree with radiology report at this time.   No results found.   Reassessment and Plan:   Blood pressure this morning grossly reassuring.  Given reassuring blood work, subacute findings on physical exam and history of present illness, patient was educated to return to the normal blood pressure regimen.   All of patient's medications were refilled for her to pick up when the pharmacy opens.  She is very concerned about her blood pressure in the interim and worried that she has not been  no access to these medications until the pharmacy opens.  Treatment with dose of hydralazine  as recommended by her cardiologist from most recent visit and she had appropriate response with 20% reduction in systolic blood pressure.  She is asymptomatic and will build up after medications in the outpatient setting. Stable for outpatient care management follow-up with primary care provider.  Recommended they call the advanced heart failure clinic to obtain close follow-up given the complexity of their situation.  Disposition:  I have considered need for hospitalization, however, considering all of the above, I believe this patient is stable for discharge at this time.  Patient/family educated about specific return precautions for given chief complaint and symptoms.  Patient/family educated about follow-up with PCP.     Patient/family expressed understanding of return precautions and need for follow-up. Patient spoken to regarding all imaging and laboratory results and appropriate follow up for these results. All education provided in verbal form with additional information in written form. Time was allowed for answering of patient questions. Patient discharged.    Emergency Department Medication Summary:   Medications - No data to display       Clinical Impression:  1. Hypertensive urgency      Data Unavailable   Final Clinical Impression(s) / ED Diagnoses Final diagnoses:  Hypertensive urgency    Rx /  DC Orders ED Discharge Orders     None         Onetha Bile, MD 01/03/24 [phone]

## 2024-01-03 NOTE — ED Triage Notes (Signed)
 Pt states Hx of HTN, is being followed for same. However, has new insurance that has cause issues keeping appointments and getting meds. Also new position at work with added stress.
